# Patient Record
Sex: Male | Born: 1940 | Race: White | Hispanic: No | Marital: Married | Smoking: Former smoker
Health system: Southern US, Community
[De-identification: ages and names within clinical notes are randomized; demographics above are authoritative.]

## PROBLEM LIST (undated history)

## (undated) DIAGNOSIS — T387X5A Adverse effect of androgens and anabolic congeners, initial encounter: Secondary | ICD-10-CM

## (undated) DIAGNOSIS — R7989 Other specified abnormal findings of blood chemistry: Secondary | ICD-10-CM

## (undated) DIAGNOSIS — R011 Cardiac murmur, unspecified: Secondary | ICD-10-CM

## (undated) DIAGNOSIS — N289 Disorder of kidney and ureter, unspecified: Secondary | ICD-10-CM

## (undated) DIAGNOSIS — Z8489 Family history of other specified conditions: Secondary | ICD-10-CM

## (undated) DIAGNOSIS — E785 Hyperlipidemia, unspecified: Secondary | ICD-10-CM

## (undated) DIAGNOSIS — R351 Nocturia: Secondary | ICD-10-CM

## (undated) DIAGNOSIS — D649 Anemia, unspecified: Secondary | ICD-10-CM

## (undated) DIAGNOSIS — M818 Other osteoporosis without current pathological fracture: Secondary | ICD-10-CM

## (undated) DIAGNOSIS — N183 Chronic kidney disease, stage 3 unspecified: Secondary | ICD-10-CM

## (undated) DIAGNOSIS — J680 Bronchitis and pneumonitis due to chemicals, gases, fumes and vapors: Secondary | ICD-10-CM

## (undated) DIAGNOSIS — Z86718 Personal history of other venous thrombosis and embolism: Secondary | ICD-10-CM

## (undated) DIAGNOSIS — I340 Nonrheumatic mitral (valve) insufficiency: Secondary | ICD-10-CM

## (undated) DIAGNOSIS — R1031 Right lower quadrant pain: Secondary | ICD-10-CM

## (undated) DIAGNOSIS — I4510 Unspecified right bundle-branch block: Secondary | ICD-10-CM

## (undated) DIAGNOSIS — C8590 Non-Hodgkin lymphoma, unspecified, unspecified site: Secondary | ICD-10-CM

## (undated) DIAGNOSIS — Z9289 Personal history of other medical treatment: Secondary | ICD-10-CM

## (undated) DIAGNOSIS — I10 Essential (primary) hypertension: Secondary | ICD-10-CM

## (undated) DIAGNOSIS — I2510 Atherosclerotic heart disease of native coronary artery without angina pectoris: Secondary | ICD-10-CM

## (undated) DIAGNOSIS — H1031 Unspecified acute conjunctivitis, right eye: Secondary | ICD-10-CM

## (undated) DIAGNOSIS — R634 Abnormal weight loss: Secondary | ICD-10-CM

## (undated) HISTORY — PX: CARDIAC CATHETERIZATION: SHX172

## (undated) HISTORY — DX: Other specified abnormal findings of blood chemistry: R79.89

## (undated) HISTORY — DX: Personal history of other venous thrombosis and embolism: Z86.718

## (undated) HISTORY — DX: Chronic kidney disease, stage 3 unspecified: N18.30

## (undated) HISTORY — DX: Right lower quadrant pain: R10.31

## (undated) HISTORY — DX: Adverse effect of androgens and anabolic congeners, initial encounter: T38.7X5A

## (undated) HISTORY — DX: Nonrheumatic mitral (valve) insufficiency: I34.0

## (undated) HISTORY — DX: Unspecified right bundle-branch block: I45.10

## (undated) HISTORY — DX: Unspecified acute conjunctivitis, right eye: H10.31

## (undated) HISTORY — DX: Hyperlipidemia, unspecified: E78.5

## (undated) HISTORY — DX: Anemia, unspecified: D64.9

## (undated) HISTORY — DX: Abnormal weight loss: R63.4

## (undated) HISTORY — DX: Other osteoporosis without current pathological fracture: M81.8

## (undated) HISTORY — DX: Chronic kidney disease, stage 3 (moderate): N18.3

## (undated) HISTORY — DX: Nocturia: R35.1

---
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
## 1988-12-19 HISTORY — PX: CATARACT EXTRACTION W/ INTRAOCULAR LENS IMPLANT: SHX1309

## 1999-11-12 ENCOUNTER — Encounter: Admission: RE | Admit: 1999-11-12 | Discharge: 1999-11-12 | Payer: Self-pay | Admitting: Internal Medicine

## 1999-11-12 ENCOUNTER — Encounter: Payer: Self-pay | Admitting: Internal Medicine

## 1999-11-18 ENCOUNTER — Encounter: Payer: Self-pay | Admitting: Internal Medicine

## 1999-11-18 ENCOUNTER — Ambulatory Visit (HOSPITAL_COMMUNITY): Admission: RE | Admit: 1999-11-18 | Discharge: 1999-11-18 | Payer: Self-pay | Admitting: Internal Medicine

## 2001-06-24 ENCOUNTER — Encounter: Payer: Self-pay | Admitting: Internal Medicine

## 2001-06-24 ENCOUNTER — Encounter: Admission: RE | Admit: 2001-06-24 | Discharge: 2001-06-24 | Payer: Self-pay | Admitting: Internal Medicine

## 2001-06-28 ENCOUNTER — Encounter (INDEPENDENT_AMBULATORY_CARE_PROVIDER_SITE_OTHER): Payer: Self-pay

## 2001-06-28 ENCOUNTER — Inpatient Hospital Stay (HOSPITAL_COMMUNITY): Admission: RE | Admit: 2001-06-28 | Discharge: 2001-07-05 | Payer: Self-pay | Admitting: Internal Medicine

## 2001-06-28 ENCOUNTER — Encounter: Payer: Self-pay | Admitting: Internal Medicine

## 2001-06-28 ENCOUNTER — Encounter (INDEPENDENT_AMBULATORY_CARE_PROVIDER_SITE_OTHER): Payer: Self-pay | Admitting: Specialist

## 2001-07-15 ENCOUNTER — Ambulatory Visit (HOSPITAL_COMMUNITY): Admission: RE | Admit: 2001-07-15 | Discharge: 2001-07-15 | Payer: Self-pay | Admitting: Oncology

## 2001-07-15 ENCOUNTER — Encounter: Payer: Self-pay | Admitting: Oncology

## 2001-07-18 ENCOUNTER — Encounter: Payer: Self-pay | Admitting: *Deleted

## 2001-07-18 ENCOUNTER — Ambulatory Visit (HOSPITAL_BASED_OUTPATIENT_CLINIC_OR_DEPARTMENT_OTHER): Admission: RE | Admit: 2001-07-18 | Discharge: 2001-07-18 | Payer: Self-pay | Admitting: *Deleted

## 2001-08-01 ENCOUNTER — Encounter: Payer: Self-pay | Admitting: Oncology

## 2001-08-01 ENCOUNTER — Ambulatory Visit (HOSPITAL_COMMUNITY): Admission: RE | Admit: 2001-08-01 | Discharge: 2001-08-01 | Payer: Self-pay | Admitting: Oncology

## 2001-09-12 ENCOUNTER — Ambulatory Visit (HOSPITAL_COMMUNITY): Admission: RE | Admit: 2001-09-12 | Discharge: 2001-09-12 | Payer: Self-pay | Admitting: Oncology

## 2001-09-12 ENCOUNTER — Encounter: Payer: Self-pay | Admitting: Oncology

## 2001-09-13 ENCOUNTER — Ambulatory Visit (HOSPITAL_COMMUNITY): Admission: RE | Admit: 2001-09-13 | Discharge: 2001-09-13 | Payer: Self-pay | Admitting: Oncology

## 2001-09-13 ENCOUNTER — Encounter: Payer: Self-pay | Admitting: Oncology

## 2001-11-23 ENCOUNTER — Encounter: Payer: Self-pay | Admitting: Oncology

## 2001-11-23 ENCOUNTER — Ambulatory Visit (HOSPITAL_COMMUNITY): Admission: RE | Admit: 2001-11-23 | Discharge: 2001-11-23 | Payer: Self-pay | Admitting: Oncology

## 2001-12-01 ENCOUNTER — Ambulatory Visit (HOSPITAL_COMMUNITY): Admission: RE | Admit: 2001-12-01 | Discharge: 2001-12-01 | Payer: Self-pay | Admitting: Oncology

## 2001-12-01 ENCOUNTER — Encounter: Payer: Self-pay | Admitting: Oncology

## 2002-04-18 ENCOUNTER — Encounter: Payer: Self-pay | Admitting: Oncology

## 2002-04-18 ENCOUNTER — Ambulatory Visit (HOSPITAL_COMMUNITY): Admission: RE | Admit: 2002-04-18 | Discharge: 2002-04-18 | Payer: Self-pay | Admitting: Oncology

## 2002-05-10 ENCOUNTER — Ambulatory Visit (HOSPITAL_BASED_OUTPATIENT_CLINIC_OR_DEPARTMENT_OTHER): Admission: RE | Admit: 2002-05-10 | Discharge: 2002-05-10 | Payer: Self-pay | Admitting: *Deleted

## 2002-08-15 ENCOUNTER — Ambulatory Visit (HOSPITAL_COMMUNITY): Admission: RE | Admit: 2002-08-15 | Discharge: 2002-08-15 | Payer: Self-pay | Admitting: Oncology

## 2002-08-15 ENCOUNTER — Encounter: Payer: Self-pay | Admitting: Oncology

## 2002-12-14 ENCOUNTER — Encounter: Payer: Self-pay | Admitting: Oncology

## 2002-12-14 ENCOUNTER — Ambulatory Visit (HOSPITAL_COMMUNITY): Admission: RE | Admit: 2002-12-14 | Discharge: 2002-12-14 | Payer: Self-pay | Admitting: Oncology

## 2003-04-04 ENCOUNTER — Ambulatory Visit (HOSPITAL_COMMUNITY): Admission: RE | Admit: 2003-04-04 | Discharge: 2003-04-04 | Payer: Self-pay | Admitting: Oncology

## 2003-07-10 ENCOUNTER — Ambulatory Visit (HOSPITAL_COMMUNITY): Admission: RE | Admit: 2003-07-10 | Discharge: 2003-07-10 | Payer: Self-pay | Admitting: Oncology

## 2003-10-01 ENCOUNTER — Ambulatory Visit (HOSPITAL_COMMUNITY): Admission: RE | Admit: 2003-10-01 | Discharge: 2003-10-01 | Payer: Self-pay | Admitting: *Deleted

## 2003-11-29 ENCOUNTER — Ambulatory Visit (HOSPITAL_COMMUNITY): Admission: RE | Admit: 2003-11-29 | Discharge: 2003-11-29 | Payer: Self-pay | Admitting: Oncology

## 2004-03-20 ENCOUNTER — Ambulatory Visit: Payer: Self-pay | Admitting: Oncology

## 2004-05-09 ENCOUNTER — Ambulatory Visit: Payer: Self-pay | Admitting: Oncology

## 2004-05-12 ENCOUNTER — Ambulatory Visit (HOSPITAL_COMMUNITY): Admission: RE | Admit: 2004-05-12 | Discharge: 2004-05-12 | Payer: Self-pay | Admitting: Oncology

## 2004-11-04 ENCOUNTER — Ambulatory Visit: Payer: Self-pay | Admitting: Oncology

## 2004-11-10 ENCOUNTER — Ambulatory Visit (HOSPITAL_COMMUNITY): Admission: RE | Admit: 2004-11-10 | Discharge: 2004-11-10 | Payer: Self-pay | Admitting: Oncology

## 2005-05-05 ENCOUNTER — Ambulatory Visit: Payer: Self-pay | Admitting: Oncology

## 2005-05-12 ENCOUNTER — Ambulatory Visit (HOSPITAL_COMMUNITY): Admission: RE | Admit: 2005-05-12 | Discharge: 2005-05-12 | Payer: Self-pay | Admitting: Oncology

## 2006-03-04 ENCOUNTER — Encounter: Payer: Self-pay | Admitting: Vascular Surgery

## 2006-03-04 ENCOUNTER — Ambulatory Visit (HOSPITAL_COMMUNITY): Admission: RE | Admit: 2006-03-04 | Discharge: 2006-03-04 | Payer: Self-pay | Admitting: Cardiology

## 2006-03-29 ENCOUNTER — Inpatient Hospital Stay (HOSPITAL_COMMUNITY): Admission: RE | Admit: 2006-03-29 | Discharge: 2006-04-03 | Payer: Self-pay | Admitting: Surgery

## 2006-03-29 HISTORY — PX: CORONARY ARTERY BYPASS GRAFT: SHX141

## 2006-04-29 ENCOUNTER — Encounter (HOSPITAL_COMMUNITY): Admission: RE | Admit: 2006-04-29 | Discharge: 2006-07-28 | Payer: Self-pay | Admitting: Cardiology

## 2006-07-29 ENCOUNTER — Encounter (HOSPITAL_COMMUNITY): Admission: RE | Admit: 2006-07-29 | Discharge: 2006-07-30 | Payer: Self-pay | Admitting: Cardiology

## 2006-09-06 ENCOUNTER — Ambulatory Visit: Payer: Self-pay | Admitting: Oncology

## 2006-09-08 LAB — CBC WITH DIFFERENTIAL/PLATELET
Basophils Absolute: 0 10*3/uL (ref 0.0–0.1)
EOS%: 3.5 % (ref 0.0–7.0)
Eosinophils Absolute: 0.2 10*3/uL (ref 0.0–0.5)
HCT: 37.9 % — ABNORMAL LOW (ref 38.7–49.9)
HGB: 13.2 g/dL (ref 13.0–17.1)
MCH: 30.8 pg (ref 28.0–33.4)
MCV: 88.6 fL (ref 81.6–98.0)
MONO%: 12.7 % (ref 0.0–13.0)
NEUT#: 3.7 10*3/uL (ref 1.5–6.5)
NEUT%: 60 % (ref 40.0–75.0)
Platelets: 297 10*3/uL (ref 145–400)

## 2006-09-08 LAB — MORPHOLOGY: PLT EST: ADEQUATE

## 2006-09-09 LAB — COMPREHENSIVE METABOLIC PANEL
ALT: 16 U/L (ref 0–53)
AST: 21 U/L (ref 0–37)
Albumin: 4.7 g/dL (ref 3.5–5.2)
BUN: 12 mg/dL (ref 6–23)
CO2: 25 mEq/L (ref 19–32)
Calcium: 9.1 mg/dL (ref 8.4–10.5)
Chloride: 105 mEq/L (ref 96–112)
Creatinine, Ser: 1.08 mg/dL (ref 0.40–1.50)
Potassium: 4.3 mEq/L (ref 3.5–5.3)

## 2006-09-09 LAB — LACTATE DEHYDROGENASE: LDH: 137 U/L (ref 94–250)

## 2006-09-10 ENCOUNTER — Ambulatory Visit (HOSPITAL_COMMUNITY): Admission: RE | Admit: 2006-09-10 | Discharge: 2006-09-10 | Payer: Self-pay | Admitting: Oncology

## 2007-09-05 ENCOUNTER — Ambulatory Visit: Payer: Self-pay | Admitting: Oncology

## 2007-09-07 LAB — COMPREHENSIVE METABOLIC PANEL
BUN: 18 mg/dL (ref 6–23)
CO2: 21 mEq/L (ref 19–32)
Calcium: 9.3 mg/dL (ref 8.4–10.5)
Chloride: 103 mEq/L (ref 96–112)
Creatinine, Ser: 1.15 mg/dL (ref 0.40–1.50)
Glucose, Bld: 85 mg/dL (ref 70–99)

## 2007-09-07 LAB — CBC WITH DIFFERENTIAL/PLATELET
BASO%: 0.2 % (ref 0.0–2.0)
EOS%: 3.6 % (ref 0.0–7.0)
HCT: 40.1 % (ref 38.7–49.9)
LYMPH%: 18.8 % (ref 14.0–48.0)
MCH: 31.6 pg (ref 28.0–33.4)
MCHC: 34.3 g/dL (ref 32.0–35.9)
NEUT%: 67.2 % (ref 40.0–75.0)
Platelets: 303 10*3/uL (ref 145–400)
lymph#: 1.3 10*3/uL (ref 0.9–3.3)

## 2007-09-09 ENCOUNTER — Ambulatory Visit (HOSPITAL_COMMUNITY): Admission: RE | Admit: 2007-09-09 | Discharge: 2007-09-09 | Payer: Self-pay | Admitting: Oncology

## 2007-12-12 ENCOUNTER — Ambulatory Visit (HOSPITAL_COMMUNITY): Admission: RE | Admit: 2007-12-12 | Discharge: 2007-12-12 | Payer: Self-pay | Admitting: Internal Medicine

## 2008-01-10 ENCOUNTER — Ambulatory Visit: Payer: Self-pay | Admitting: Oncology

## 2008-01-10 LAB — CBC WITH DIFFERENTIAL/PLATELET
Basophils Absolute: 0 10*3/uL (ref 0.0–0.1)
Eosinophils Absolute: 0.1 10*3/uL (ref 0.0–0.5)
HGB: 11.1 g/dL — ABNORMAL LOW (ref 13.0–17.1)
LYMPH%: 9.1 % — ABNORMAL LOW (ref 14.0–48.0)
MCV: 86.5 fL (ref 81.6–98.0)
MONO#: 0.8 10*3/uL (ref 0.1–0.9)
MONO%: 8.3 % (ref 0.0–13.0)
NEUT#: 8.3 10*3/uL — ABNORMAL HIGH (ref 1.5–6.5)
Platelets: 479 10*3/uL — ABNORMAL HIGH (ref 145–400)
RDW: 12.1 % (ref 11.2–14.6)

## 2008-01-10 LAB — COMPREHENSIVE METABOLIC PANEL
Albumin: 3.6 g/dL (ref 3.5–5.2)
Alkaline Phosphatase: 72 U/L (ref 39–117)
BUN: 12 mg/dL (ref 6–23)
CO2: 24 mEq/L (ref 19–32)
Glucose, Bld: 99 mg/dL (ref 70–99)
Potassium: 4.3 mEq/L (ref 3.5–5.3)

## 2008-01-10 LAB — LACTATE DEHYDROGENASE: LDH: 120 U/L (ref 94–250)

## 2008-01-10 LAB — URINALYSIS, MICROSCOPIC - CHCC
Blood: NEGATIVE
Glucose: NEGATIVE g/dL
Leukocyte Esterase: NEGATIVE
Nitrite: NEGATIVE

## 2008-01-13 ENCOUNTER — Ambulatory Visit (HOSPITAL_COMMUNITY): Admission: RE | Admit: 2008-01-13 | Discharge: 2008-01-13 | Payer: Self-pay | Admitting: Oncology

## 2008-01-19 ENCOUNTER — Encounter (INDEPENDENT_AMBULATORY_CARE_PROVIDER_SITE_OTHER): Payer: Self-pay | Admitting: Diagnostic Radiology

## 2008-01-19 ENCOUNTER — Ambulatory Visit (HOSPITAL_COMMUNITY): Admission: RE | Admit: 2008-01-19 | Discharge: 2008-01-19 | Payer: Self-pay | Admitting: Oncology

## 2008-01-24 ENCOUNTER — Inpatient Hospital Stay (HOSPITAL_COMMUNITY): Admission: AD | Admit: 2008-01-24 | Discharge: 2008-01-27 | Payer: Self-pay | Admitting: Oncology

## 2008-01-25 ENCOUNTER — Ambulatory Visit: Payer: Self-pay | Admitting: Oncology

## 2008-01-27 ENCOUNTER — Ambulatory Visit: Payer: Self-pay | Admitting: Vascular Surgery

## 2008-01-27 ENCOUNTER — Encounter: Payer: Self-pay | Admitting: Oncology

## 2008-02-07 LAB — LACTATE DEHYDROGENASE: LDH: 310 U/L — ABNORMAL HIGH (ref 94–250)

## 2008-02-07 LAB — COMPREHENSIVE METABOLIC PANEL
Albumin: 4 g/dL (ref 3.5–5.2)
CO2: 24 mEq/L (ref 19–32)
Calcium: 8.8 mg/dL (ref 8.4–10.5)
Chloride: 105 mEq/L (ref 96–112)
Glucose, Bld: 96 mg/dL (ref 70–99)
Sodium: 138 mEq/L (ref 135–145)
Total Bilirubin: 0.2 mg/dL — ABNORMAL LOW (ref 0.3–1.2)
Total Protein: 6.7 g/dL (ref 6.0–8.3)

## 2008-02-07 LAB — CBC WITH DIFFERENTIAL/PLATELET
Eosinophils Absolute: 0.3 10*3/uL (ref 0.0–0.5)
LYMPH%: 5.7 % — ABNORMAL LOW (ref 14.0–48.0)
MCHC: 33.2 g/dL (ref 32.0–35.9)
MCV: 85.1 fL (ref 81.6–98.0)
MONO%: 2 % (ref 0.0–13.0)
NEUT#: 22.9 10*3/uL — ABNORMAL HIGH (ref 1.5–6.5)
NEUT%: 91.1 % — ABNORMAL HIGH (ref 40.0–75.0)
Platelets: 107 10*3/uL — ABNORMAL LOW (ref 145–400)
RBC: 3.63 10*6/uL — ABNORMAL LOW (ref 4.20–5.71)

## 2008-02-07 LAB — URIC ACID: Uric Acid, Serum: 4.2 mg/dL (ref 4.0–7.8)

## 2008-02-07 LAB — MORPHOLOGY

## 2008-02-14 LAB — CBC WITH DIFFERENTIAL/PLATELET
Basophils Absolute: 0 10*3/uL (ref 0.0–0.1)
EOS%: 0.4 % (ref 0.0–7.0)
HCT: 30.3 % — ABNORMAL LOW (ref 38.7–49.9)
HGB: 10.1 g/dL — ABNORMAL LOW (ref 13.0–17.1)
LYMPH%: 7.5 % — ABNORMAL LOW (ref 14.0–48.0)
MCH: 28.5 pg (ref 28.0–33.4)
MCV: 85.7 fL (ref 81.6–98.0)
MONO%: 5.1 % (ref 0.0–13.0)
NEUT%: 87 % — ABNORMAL HIGH (ref 40.0–75.0)

## 2008-02-14 LAB — COMPREHENSIVE METABOLIC PANEL
AST: 26 U/L (ref 0–37)
Alkaline Phosphatase: 106 U/L (ref 39–117)
BUN: 12 mg/dL (ref 6–23)
Creatinine, Ser: 1.02 mg/dL (ref 0.40–1.50)

## 2008-02-27 ENCOUNTER — Ambulatory Visit: Payer: Self-pay | Admitting: Oncology

## 2008-02-29 LAB — COMPREHENSIVE METABOLIC PANEL
AST: 27 U/L (ref 0–37)
Albumin: 4.5 g/dL (ref 3.5–5.2)
Alkaline Phosphatase: 146 U/L — ABNORMAL HIGH (ref 39–117)
Potassium: 4.4 mEq/L (ref 3.5–5.3)
Sodium: 140 mEq/L (ref 135–145)
Total Bilirubin: 0.3 mg/dL (ref 0.3–1.2)
Total Protein: 7.3 g/dL (ref 6.0–8.3)

## 2008-02-29 LAB — CBC WITH DIFFERENTIAL/PLATELET
EOS%: 0.7 % (ref 0.0–7.0)
MCH: 29 pg (ref 28.0–33.4)
MCHC: 33.5 g/dL (ref 32.0–35.9)
MCV: 86.5 fL (ref 81.6–98.0)
MONO%: 1.7 % (ref 0.0–13.0)
NEUT#: 21.6 10*3/uL — ABNORMAL HIGH (ref 1.5–6.5)
RBC: 3.3 10*6/uL — ABNORMAL LOW (ref 4.20–5.71)
RDW: 17.5 % — ABNORMAL HIGH (ref 11.2–14.6)

## 2008-03-07 LAB — BASIC METABOLIC PANEL
BUN: 15 mg/dL (ref 6–23)
CO2: 25 mEq/L (ref 19–32)
Calcium: 8.8 mg/dL (ref 8.4–10.5)
Glucose, Bld: 95 mg/dL (ref 70–99)
Potassium: 4.7 mEq/L (ref 3.5–5.3)

## 2008-03-07 LAB — CBC WITH DIFFERENTIAL/PLATELET
BASO%: 0.1 % (ref 0.0–2.0)
EOS%: 0.7 % (ref 0.0–7.0)
LYMPH%: 8.5 % — ABNORMAL LOW (ref 14.0–48.0)
MCH: 29.6 pg (ref 28.0–33.4)
MCHC: 33.9 g/dL (ref 32.0–35.9)
MONO#: 0.9 10*3/uL (ref 0.1–0.9)
Platelets: 327 10*3/uL (ref 145–400)
RBC: 3.12 10*6/uL — ABNORMAL LOW (ref 4.20–5.71)
WBC: 11.4 10*3/uL — ABNORMAL HIGH (ref 4.0–10.0)

## 2008-03-20 ENCOUNTER — Encounter (HOSPITAL_COMMUNITY): Admission: RE | Admit: 2008-03-20 | Discharge: 2008-04-19 | Payer: Self-pay | Admitting: Oncology

## 2008-03-20 LAB — CBC WITH DIFFERENTIAL/PLATELET
Basophils Absolute: 0 10*3/uL (ref 0.0–0.1)
EOS%: 0.9 % (ref 0.0–7.0)
Eosinophils Absolute: 0.1 10*3/uL (ref 0.0–0.5)
HCT: 22.3 % — ABNORMAL LOW (ref 38.7–49.9)
HGB: 7.7 g/dL — ABNORMAL LOW (ref 13.0–17.1)
MCH: 30.9 pg (ref 28.0–33.4)
MCV: 90 fL (ref 81.6–98.0)
MONO%: 5 % (ref 0.0–13.0)
NEUT#: 10.3 10*3/uL — ABNORMAL HIGH (ref 1.5–6.5)
NEUT%: 85.2 % — ABNORMAL HIGH (ref 40.0–75.0)
Platelets: 47 10*3/uL — ABNORMAL LOW (ref 145–400)

## 2008-03-20 LAB — COMPREHENSIVE METABOLIC PANEL
AST: 19 U/L (ref 0–37)
Albumin: 4.2 g/dL (ref 3.5–5.2)
Alkaline Phosphatase: 119 U/L — ABNORMAL HIGH (ref 39–117)
BUN: 10 mg/dL (ref 6–23)
Calcium: 8.9 mg/dL (ref 8.4–10.5)
Creatinine, Ser: 1.04 mg/dL (ref 0.40–1.50)
Glucose, Bld: 106 mg/dL — ABNORMAL HIGH (ref 70–99)

## 2008-03-28 ENCOUNTER — Ambulatory Visit (HOSPITAL_COMMUNITY): Admission: RE | Admit: 2008-03-28 | Discharge: 2008-03-28 | Payer: Self-pay | Admitting: Oncology

## 2008-04-06 ENCOUNTER — Ambulatory Visit: Payer: Self-pay | Admitting: Oncology

## 2008-04-10 LAB — BASIC METABOLIC PANEL
BUN: 23 mg/dL (ref 6–23)
CO2: 19 mEq/L (ref 19–32)
Calcium: 8.1 mg/dL — ABNORMAL LOW (ref 8.4–10.5)
Creatinine, Ser: 1.24 mg/dL (ref 0.40–1.50)
Glucose, Bld: 98 mg/dL (ref 70–99)

## 2008-04-10 LAB — CBC WITH DIFFERENTIAL/PLATELET
BASO%: 0 % (ref 0.0–2.0)
Basophils Absolute: 0 10*3/uL (ref 0.0–0.1)
Eosinophils Absolute: 0.1 10*3/uL (ref 0.0–0.5)
HCT: 28.3 % — ABNORMAL LOW (ref 38.7–49.9)
HGB: 9.6 g/dL — ABNORMAL LOW (ref 13.0–17.1)
LYMPH%: 2.8 % — ABNORMAL LOW (ref 14.0–48.0)
MCHC: 34.1 g/dL (ref 32.0–35.9)
MONO#: 0.1 10*3/uL (ref 0.1–0.9)
NEUT%: 96 % — ABNORMAL HIGH (ref 40.0–75.0)
Platelets: 144 10*3/uL — ABNORMAL LOW (ref 145–400)
WBC: 14.5 10*3/uL — ABNORMAL HIGH (ref 4.0–10.0)

## 2008-04-12 LAB — CBC WITH DIFFERENTIAL/PLATELET
Basophils Absolute: 0 10*3/uL (ref 0.0–0.1)
EOS%: 1.3 % (ref 0.0–7.0)
HGB: 9.3 g/dL — ABNORMAL LOW (ref 13.0–17.1)
LYMPH%: 9.5 % — ABNORMAL LOW (ref 14.0–48.0)
MCH: 33.6 pg — ABNORMAL HIGH (ref 28.0–33.4)
MCV: 97 fL (ref 81.6–98.0)
MONO%: 1.7 % (ref 0.0–13.0)
NEUT%: 87.4 % — ABNORMAL HIGH (ref 40.0–75.0)
RDW: 22.7 % — ABNORMAL HIGH (ref 11.2–14.6)

## 2008-04-20 HISTORY — PX: BONE MARROW TRANSPLANT: SHX200

## 2008-04-23 LAB — CBC WITH DIFFERENTIAL/PLATELET
Basophils Absolute: 0 10*3/uL (ref 0.0–0.1)
Eosinophils Absolute: 0.2 10*3/uL (ref 0.0–0.5)
HCT: 29.4 % — ABNORMAL LOW (ref 38.7–49.9)
HGB: 10.2 g/dL — ABNORMAL LOW (ref 13.0–17.1)
MCH: 32.7 pg (ref 28.0–33.4)
MONO#: 1.1 10*3/uL — ABNORMAL HIGH (ref 0.1–0.9)
NEUT#: 6.1 10*3/uL (ref 1.5–6.5)
NEUT%: 76.8 % — ABNORMAL HIGH (ref 40.0–75.0)
WBC: 7.9 10*3/uL (ref 4.0–10.0)
lymph#: 0.6 10*3/uL — ABNORMAL LOW (ref 0.9–3.3)

## 2008-05-23 ENCOUNTER — Ambulatory Visit: Payer: Self-pay | Admitting: Oncology

## 2008-05-25 LAB — CBC WITH DIFFERENTIAL/PLATELET
Basophils Absolute: 0 10*3/uL (ref 0.0–0.1)
Eosinophils Absolute: 0 10*3/uL (ref 0.0–0.5)
HCT: 28.5 % — ABNORMAL LOW (ref 38.7–49.9)
HGB: 9.9 g/dL — ABNORMAL LOW (ref 13.0–17.1)
LYMPH%: 11.4 % — ABNORMAL LOW (ref 14.0–48.0)
MCV: 92.7 fL (ref 81.6–98.0)
MONO%: 22.4 % — ABNORMAL HIGH (ref 0.0–13.0)
NEUT#: 5.8 10*3/uL (ref 1.5–6.5)
NEUT%: 65.8 % (ref 40.0–75.0)
Platelets: 49 10*3/uL — ABNORMAL LOW (ref 145–400)
RBC: 3.08 10*6/uL — ABNORMAL LOW (ref 4.20–5.71)

## 2008-05-29 LAB — CBC WITH DIFFERENTIAL/PLATELET
Basophils Absolute: 0.1 10*3/uL (ref 0.0–0.1)
Eosinophils Absolute: 0 10*3/uL (ref 0.0–0.5)
HCT: 31.8 % — ABNORMAL LOW (ref 38.7–49.9)
HGB: 10.8 g/dL — ABNORMAL LOW (ref 13.0–17.1)
LYMPH%: 23 % (ref 14.0–48.0)
MCHC: 33.9 g/dL (ref 32.0–35.9)
MONO#: 1.7 10*3/uL — ABNORMAL HIGH (ref 0.1–0.9)
NEUT#: 3.5 10*3/uL (ref 1.5–6.5)
NEUT%: 51.1 % (ref 40.0–75.0)
Platelets: 99 10*3/uL — ABNORMAL LOW (ref 145–400)
WBC: 6.9 10*3/uL (ref 4.0–10.0)
lymph#: 1.6 10*3/uL (ref 0.9–3.3)

## 2008-05-29 LAB — COMPREHENSIVE METABOLIC PANEL
ALT: 12 U/L (ref 0–53)
BUN: 14 mg/dL (ref 6–23)
CO2: 23 mEq/L (ref 19–32)
Creatinine, Ser: 1.07 mg/dL (ref 0.40–1.50)
Total Bilirubin: 0.8 mg/dL (ref 0.3–1.2)

## 2008-05-29 LAB — MAGNESIUM: Magnesium: 1.7 mg/dL (ref 1.5–2.5)

## 2008-06-05 ENCOUNTER — Ambulatory Visit: Payer: Self-pay | Admitting: Oncology

## 2008-06-05 ENCOUNTER — Inpatient Hospital Stay (HOSPITAL_COMMUNITY): Admission: AD | Admit: 2008-06-05 | Discharge: 2008-06-07 | Payer: Self-pay | Admitting: Oncology

## 2008-06-05 LAB — CBC WITH DIFFERENTIAL/PLATELET
BASO%: 3.1 % — ABNORMAL HIGH (ref 0.0–2.0)
Basophils Absolute: 0.3 10*3/uL — ABNORMAL HIGH (ref 0.0–0.1)
EOS%: 1.9 % (ref 0.0–7.0)
HCT: 33.7 % — ABNORMAL LOW (ref 38.7–49.9)
HGB: 11.6 g/dL — ABNORMAL LOW (ref 13.0–17.1)
LYMPH%: 40.7 % (ref 14.0–48.0)
MCH: 32.4 pg (ref 28.0–33.4)
MCHC: 34.4 g/dL (ref 32.0–35.9)
MCV: 94.2 fL (ref 81.6–98.0)
NEUT%: 37.6 % — ABNORMAL LOW (ref 40.0–75.0)
Platelets: 156 10*3/uL (ref 145–400)

## 2008-06-05 LAB — COMPREHENSIVE METABOLIC PANEL
AST: 39 U/L — ABNORMAL HIGH (ref 0–37)
Albumin: 3.1 g/dL — ABNORMAL LOW (ref 3.5–5.2)
Alkaline Phosphatase: 101 U/L (ref 39–117)
BUN: 8 mg/dL (ref 6–23)
Potassium: 4.2 mEq/L (ref 3.5–5.3)
Total Bilirubin: 0.7 mg/dL (ref 0.3–1.2)

## 2008-06-05 LAB — MORPHOLOGY

## 2008-07-02 LAB — CBC WITH DIFFERENTIAL/PLATELET
Eosinophils Absolute: 0 10*3/uL (ref 0.0–0.5)
MONO#: 0.3 10*3/uL (ref 0.1–0.9)
NEUT#: 6.4 10*3/uL (ref 1.5–6.5)
RBC: 3.5 10*6/uL — ABNORMAL LOW (ref 4.20–5.82)
RDW: 21.4 % — ABNORMAL HIGH (ref 11.0–14.6)
WBC: 7.6 10*3/uL (ref 4.0–10.3)
lymph#: 0.8 10*3/uL — ABNORMAL LOW (ref 0.9–3.3)

## 2008-07-02 LAB — COMPREHENSIVE METABOLIC PANEL
AST: 32 U/L (ref 0–37)
Albumin: 4 g/dL (ref 3.5–5.2)
Alkaline Phosphatase: 88 U/L (ref 39–117)
BUN: 11 mg/dL (ref 6–23)
Potassium: 4.4 mEq/L (ref 3.5–5.3)
Sodium: 134 mEq/L — ABNORMAL LOW (ref 135–145)
Total Bilirubin: 0.7 mg/dL (ref 0.3–1.2)

## 2008-07-03 ENCOUNTER — Ambulatory Visit (HOSPITAL_COMMUNITY): Admission: RE | Admit: 2008-07-03 | Discharge: 2008-07-03 | Payer: Self-pay | Admitting: Oncology

## 2008-07-19 ENCOUNTER — Ambulatory Visit: Payer: Self-pay | Admitting: Oncology

## 2008-07-24 LAB — CBC WITH DIFFERENTIAL/PLATELET
BASO%: 0.3 % (ref 0.0–2.0)
EOS%: 0.4 % (ref 0.0–7.0)
HCT: 29.7 % — ABNORMAL LOW (ref 38.4–49.9)
HGB: 10.4 g/dL — ABNORMAL LOW (ref 13.0–17.1)
MCH: 35.7 pg — ABNORMAL HIGH (ref 27.2–33.4)
MCHC: 34.8 g/dL (ref 32.0–36.0)
MONO#: 0.8 10*3/uL (ref 0.1–0.9)
NEUT%: 64.6 % (ref 39.0–75.0)
RDW: 16.8 % — ABNORMAL HIGH (ref 11.0–14.6)
WBC: 5.8 10*3/uL (ref 4.0–10.3)
lymph#: 1.3 10*3/uL (ref 0.9–3.3)

## 2008-07-24 LAB — COMPREHENSIVE METABOLIC PANEL
Alkaline Phosphatase: 113 U/L (ref 39–117)
BUN: 13 mg/dL (ref 6–23)
CO2: 23 mEq/L (ref 19–32)
Creatinine, Ser: 1.03 mg/dL (ref 0.40–1.50)
Glucose, Bld: 91 mg/dL (ref 70–99)
Sodium: 137 mEq/L (ref 135–145)
Total Bilirubin: 0.7 mg/dL (ref 0.3–1.2)

## 2008-07-24 LAB — MORPHOLOGY

## 2008-07-24 LAB — LACTATE DEHYDROGENASE: LDH: 145 U/L (ref 94–250)

## 2008-08-03 LAB — CBC WITH DIFFERENTIAL/PLATELET
BASO%: 0.2 % (ref 0.0–2.0)
Basophils Absolute: 0 10*3/uL (ref 0.0–0.1)
Eosinophils Absolute: 0.1 10*3/uL (ref 0.0–0.5)
HCT: 27.7 % — ABNORMAL LOW (ref 38.4–49.9)
HGB: 9.5 g/dL — ABNORMAL LOW (ref 13.0–17.1)
LYMPH%: 20 % (ref 14.0–49.0)
MCHC: 34.3 g/dL (ref 32.0–36.0)
MONO#: 1.4 10*3/uL — ABNORMAL HIGH (ref 0.1–0.9)
NEUT#: 5.8 10*3/uL (ref 1.5–6.5)
NEUT%: 63.6 % (ref 39.0–75.0)
Platelets: 123 10*3/uL — ABNORMAL LOW (ref 140–400)
WBC: 9.2 10*3/uL (ref 4.0–10.3)
lymph#: 1.8 10*3/uL (ref 0.9–3.3)

## 2008-08-06 LAB — COMPREHENSIVE METABOLIC PANEL
Albumin: 3.4 g/dL — ABNORMAL LOW (ref 3.5–5.2)
BUN: 12 mg/dL (ref 6–23)
CO2: 24 mEq/L (ref 19–32)
Glucose, Bld: 100 mg/dL — ABNORMAL HIGH (ref 70–99)
Potassium: 3.9 mEq/L (ref 3.5–5.3)
Sodium: 137 mEq/L (ref 135–145)
Total Protein: 6.3 g/dL (ref 6.0–8.3)

## 2008-08-06 LAB — CBC WITH DIFFERENTIAL/PLATELET
Basophils Absolute: 0 10*3/uL (ref 0.0–0.1)
Eosinophils Absolute: 0.1 10*3/uL (ref 0.0–0.5)
HGB: 10.4 g/dL — ABNORMAL LOW (ref 13.0–17.1)
LYMPH%: 32.3 % (ref 14.0–49.0)
MONO#: 1.7 10*3/uL — ABNORMAL HIGH (ref 0.1–0.9)
NEUT#: 5.1 10*3/uL (ref 1.5–6.5)
Platelets: 178 10*3/uL (ref 140–400)
RBC: 2.98 10*6/uL — ABNORMAL LOW (ref 4.20–5.82)
WBC: 10.1 10*3/uL (ref 4.0–10.3)

## 2008-08-07 ENCOUNTER — Ambulatory Visit (HOSPITAL_COMMUNITY): Admission: RE | Admit: 2008-08-07 | Discharge: 2008-08-07 | Payer: Self-pay | Admitting: Oncology

## 2008-08-10 ENCOUNTER — Ambulatory Visit: Admission: RE | Admit: 2008-08-10 | Discharge: 2008-08-10 | Payer: Self-pay | Admitting: Oncology

## 2008-09-04 ENCOUNTER — Ambulatory Visit: Payer: Self-pay | Admitting: Oncology

## 2008-09-04 LAB — CBC WITH DIFFERENTIAL/PLATELET
BASO%: 0 % (ref 0.0–2.0)
EOS%: 0.2 % (ref 0.0–7.0)
HCT: 32.2 % — ABNORMAL LOW (ref 38.4–49.9)
MCH: 38.1 pg — ABNORMAL HIGH (ref 27.2–33.4)
MCHC: 34.1 g/dL (ref 32.0–36.0)
MONO%: 3.3 % (ref 0.0–14.0)
NEUT#: 10.3 10*3/uL — ABNORMAL HIGH (ref 1.5–6.5)
Platelets: 187 10*3/uL (ref 140–400)
RBC: 2.88 10*6/uL — ABNORMAL LOW (ref 4.20–5.82)
RDW: 21.3 % — ABNORMAL HIGH (ref 11.0–14.6)
WBC: 11.1 10*3/uL — ABNORMAL HIGH (ref 4.0–10.3)
lymph#: 0.5 10*3/uL — ABNORMAL LOW (ref 0.9–3.3)

## 2008-09-04 LAB — COMPREHENSIVE METABOLIC PANEL
Albumin: 3.9 g/dL (ref 3.5–5.2)
Alkaline Phosphatase: 74 U/L (ref 39–117)
BUN: 17 mg/dL (ref 6–23)
CO2: 28 mEq/L (ref 19–32)
Glucose, Bld: 105 mg/dL — ABNORMAL HIGH (ref 70–99)
Total Bilirubin: 1 mg/dL (ref 0.3–1.2)
Total Protein: 6 g/dL (ref 6.0–8.3)

## 2008-09-04 LAB — SEDIMENTATION RATE: Sed Rate: 2 mm/hr (ref 0–16)

## 2008-09-04 LAB — MORPHOLOGY

## 2008-09-04 LAB — LACTATE DEHYDROGENASE: LDH: 176 U/L (ref 94–250)

## 2008-10-30 ENCOUNTER — Ambulatory Visit (HOSPITAL_COMMUNITY): Admission: RE | Admit: 2008-10-30 | Discharge: 2008-10-30 | Payer: Self-pay | Admitting: Oncology

## 2008-10-31 ENCOUNTER — Ambulatory Visit: Payer: Self-pay | Admitting: Oncology

## 2008-11-02 LAB — CBC WITH DIFFERENTIAL/PLATELET
BASO%: 0.3 % (ref 0.0–2.0)
Basophils Absolute: 0 10*3/uL (ref 0.0–0.1)
EOS%: 0.6 % (ref 0.0–7.0)
HCT: 30.1 % — ABNORMAL LOW (ref 38.4–49.9)
HGB: 10.2 g/dL — ABNORMAL LOW (ref 13.0–17.1)
MCH: 39.5 pg — ABNORMAL HIGH (ref 27.2–33.4)
MONO#: 0.8 10*3/uL (ref 0.1–0.9)
RDW: 14 % (ref 11.0–14.6)
WBC: 9.5 10*3/uL (ref 4.0–10.3)
lymph#: 1.8 10*3/uL (ref 0.9–3.3)

## 2008-11-02 LAB — MORPHOLOGY: PLT EST: ADEQUATE

## 2008-11-02 LAB — COMPREHENSIVE METABOLIC PANEL
ALT: 25 U/L (ref 0–53)
AST: 31 U/L (ref 0–37)
Chloride: 105 mEq/L (ref 96–112)
Creatinine, Ser: 1.44 mg/dL (ref 0.40–1.50)
Sodium: 139 mEq/L (ref 135–145)
Total Bilirubin: 0.8 mg/dL (ref 0.3–1.2)
Total Protein: 5.9 g/dL — ABNORMAL LOW (ref 6.0–8.3)

## 2008-12-28 ENCOUNTER — Ambulatory Visit: Payer: Self-pay | Admitting: Oncology

## 2009-01-01 LAB — CBC WITH DIFFERENTIAL/PLATELET
BASO%: 0.3 % (ref 0.0–2.0)
EOS%: 2.3 % (ref 0.0–7.0)
HCT: 34 % — ABNORMAL LOW (ref 38.4–49.9)
LYMPH%: 21.9 % (ref 14.0–49.0)
MCH: 36.4 pg — ABNORMAL HIGH (ref 27.2–33.4)
MCHC: 35 g/dL (ref 32.0–36.0)
MONO#: 0.8 10*3/uL (ref 0.1–0.9)
MONO%: 12.4 % (ref 0.0–14.0)
NEUT%: 63.1 % (ref 39.0–75.0)
Platelets: 196 10*3/uL (ref 140–400)
RBC: 3.27 10*6/uL — ABNORMAL LOW (ref 4.20–5.82)
WBC: 6.3 10*3/uL (ref 4.0–10.3)

## 2009-01-01 LAB — COMPREHENSIVE METABOLIC PANEL
ALT: 17 U/L (ref 0–53)
BUN: 19 mg/dL (ref 6–23)
CO2: 25 mEq/L (ref 19–32)
Calcium: 9.6 mg/dL (ref 8.4–10.5)
Chloride: 104 mEq/L (ref 96–112)
Creatinine, Ser: 1.35 mg/dL (ref 0.40–1.50)
Glucose, Bld: 94 mg/dL (ref 70–99)
Total Bilirubin: 0.6 mg/dL (ref 0.3–1.2)

## 2009-01-01 LAB — MORPHOLOGY: PLT EST: ADEQUATE

## 2009-01-01 LAB — LACTATE DEHYDROGENASE: LDH: 152 U/L (ref 94–250)

## 2009-03-05 ENCOUNTER — Ambulatory Visit (HOSPITAL_COMMUNITY): Admission: RE | Admit: 2009-03-05 | Discharge: 2009-03-05 | Payer: Self-pay | Admitting: Oncology

## 2009-05-21 ENCOUNTER — Ambulatory Visit (HOSPITAL_COMMUNITY): Admission: RE | Admit: 2009-05-21 | Discharge: 2009-05-21 | Payer: Self-pay | Admitting: Oncology

## 2009-06-21 ENCOUNTER — Ambulatory Visit: Payer: Self-pay | Admitting: Oncology

## 2009-06-25 LAB — CBC WITH DIFFERENTIAL/PLATELET
BASO%: 0.2 % (ref 0.0–2.0)
EOS%: 3.5 % (ref 0.0–7.0)
HCT: 34.6 % — ABNORMAL LOW (ref 38.4–49.9)
LYMPH%: 21.6 % (ref 14.0–49.0)
MCH: 36.2 pg — ABNORMAL HIGH (ref 27.2–33.4)
MCHC: 35.3 g/dL (ref 32.0–36.0)
NEUT%: 61.5 % (ref 39.0–75.0)
Platelets: 199 10*3/uL (ref 140–400)
RBC: 3.37 10*6/uL — ABNORMAL LOW (ref 4.20–5.82)
lymph#: 1.5 10*3/uL (ref 0.9–3.3)

## 2009-06-25 LAB — MORPHOLOGY

## 2009-06-25 LAB — COMPREHENSIVE METABOLIC PANEL
CO2: 27 mEq/L (ref 19–32)
Creatinine, Ser: 1.36 mg/dL (ref 0.40–1.50)
Glucose, Bld: 80 mg/dL (ref 70–99)
Total Bilirubin: 0.6 mg/dL (ref 0.3–1.2)

## 2009-06-25 LAB — LACTATE DEHYDROGENASE: LDH: 125 U/L (ref 94–250)

## 2009-06-25 LAB — SEDIMENTATION RATE: Sed Rate: 13 mm/hr (ref 0–16)

## 2009-09-20 ENCOUNTER — Ambulatory Visit: Payer: Self-pay | Admitting: Oncology

## 2009-09-23 LAB — COMPREHENSIVE METABOLIC PANEL
Albumin: 3.9 g/dL (ref 3.5–5.2)
Alkaline Phosphatase: 84 U/L (ref 39–117)
BUN: 16 mg/dL (ref 6–23)
Calcium: 9.3 mg/dL (ref 8.4–10.5)
Chloride: 106 mEq/L (ref 96–112)
Glucose, Bld: 90 mg/dL (ref 70–99)
Potassium: 4.2 mEq/L (ref 3.5–5.3)
Sodium: 138 mEq/L (ref 135–145)
Total Protein: 6.4 g/dL (ref 6.0–8.3)

## 2009-09-23 LAB — CBC WITH DIFFERENTIAL/PLATELET
Basophils Absolute: 0 10*3/uL (ref 0.0–0.1)
Eosinophils Absolute: 0.2 10*3/uL (ref 0.0–0.5)
HGB: 11.5 g/dL — ABNORMAL LOW (ref 13.0–17.1)
MCV: 99.8 fL — ABNORMAL HIGH (ref 79.3–98.0)
MONO#: 0.9 10*3/uL (ref 0.1–0.9)
MONO%: 14 % (ref 0.0–14.0)
NEUT#: 3.7 10*3/uL (ref 1.5–6.5)
RBC: 3.34 10*6/uL — ABNORMAL LOW (ref 4.20–5.82)
RDW: 13 % (ref 11.0–14.6)
WBC: 6.3 10*3/uL (ref 4.0–10.3)
lymph#: 1.5 10*3/uL (ref 0.9–3.3)

## 2009-09-23 LAB — SEDIMENTATION RATE: Sed Rate: 15 mm/hr (ref 0–16)

## 2009-09-24 ENCOUNTER — Ambulatory Visit (HOSPITAL_COMMUNITY): Admission: RE | Admit: 2009-09-24 | Discharge: 2009-09-24 | Payer: Self-pay | Admitting: Oncology

## 2009-12-27 ENCOUNTER — Ambulatory Visit: Payer: Self-pay | Admitting: Oncology

## 2009-12-31 LAB — CBC WITH DIFFERENTIAL/PLATELET
BASO%: 0.3 % (ref 0.0–2.0)
Basophils Absolute: 0 10*3/uL (ref 0.0–0.1)
HCT: 33.5 % — ABNORMAL LOW (ref 38.4–49.9)
HGB: 11.7 g/dL — ABNORMAL LOW (ref 13.0–17.1)
LYMPH%: 24.9 % (ref 14.0–49.0)
MCH: 34.5 pg — ABNORMAL HIGH (ref 27.2–33.4)
MCHC: 34.8 g/dL (ref 32.0–36.0)
MONO#: 0.5 10*3/uL (ref 0.1–0.9)
NEUT%: 63.1 % (ref 39.0–75.0)
Platelets: 200 10*3/uL (ref 140–400)
WBC: 5.2 10*3/uL (ref 4.0–10.3)
lymph#: 1.3 10*3/uL (ref 0.9–3.3)

## 2010-01-01 LAB — COMPREHENSIVE METABOLIC PANEL
ALT: 26 U/L (ref 0–53)
BUN: 19 mg/dL (ref 6–23)
CO2: 27 mEq/L (ref 19–32)
Calcium: 9.9 mg/dL (ref 8.4–10.5)
Chloride: 103 mEq/L (ref 96–112)
Creatinine, Ser: 1.43 mg/dL (ref 0.40–1.50)
Total Bilirubin: 0.6 mg/dL (ref 0.3–1.2)

## 2010-01-01 LAB — SEDIMENTATION RATE: Sed Rate: 8 mm/hr (ref 0–16)

## 2010-01-01 LAB — LACTATE DEHYDROGENASE: LDH: 149 U/L (ref 94–250)

## 2010-04-28 ENCOUNTER — Ambulatory Visit: Payer: Self-pay | Admitting: Oncology

## 2010-04-28 LAB — COMPREHENSIVE METABOLIC PANEL
ALT: 39 U/L (ref 0–53)
AST: 33 U/L (ref 0–37)
Albumin: 3.9 g/dL (ref 3.5–5.2)
Alkaline Phosphatase: 83 U/L (ref 39–117)
BUN: 14 mg/dL (ref 6–23)
CO2: 28 mEq/L (ref 19–32)
Calcium: 9.6 mg/dL (ref 8.4–10.5)
Chloride: 104 mEq/L (ref 96–112)
Creatinine, Ser: 1.44 mg/dL (ref 0.40–1.50)
Glucose, Bld: 102 mg/dL — ABNORMAL HIGH (ref 70–99)
Potassium: 4.5 mEq/L (ref 3.5–5.3)
Sodium: 142 mEq/L (ref 135–145)
Total Bilirubin: 0.5 mg/dL (ref 0.3–1.2)
Total Protein: 6.4 g/dL (ref 6.0–8.3)

## 2010-04-28 LAB — CBC WITH DIFFERENTIAL/PLATELET
BASO%: 0.1 % (ref 0.0–2.0)
Basophils Absolute: 0 10*3/uL (ref 0.0–0.1)
EOS%: 6.9 % (ref 0.0–7.0)
Eosinophils Absolute: 0.4 10*3/uL (ref 0.0–0.5)
HCT: 32.4 % — ABNORMAL LOW (ref 38.4–49.9)
HGB: 11.1 g/dL — ABNORMAL LOW (ref 13.0–17.1)
LYMPH%: 23 % (ref 14.0–49.0)
MCH: 33.8 pg — ABNORMAL HIGH (ref 27.2–33.4)
MCHC: 34.2 g/dL (ref 32.0–36.0)
MCV: 98.8 fL — ABNORMAL HIGH (ref 79.3–98.0)
MONO#: 0.8 10*3/uL (ref 0.1–0.9)
MONO%: 14.3 % — ABNORMAL HIGH (ref 0.0–14.0)
NEUT#: 3.2 10*3/uL (ref 1.5–6.5)
NEUT%: 55.7 % (ref 39.0–75.0)
Platelets: 216 10*3/uL (ref 140–400)
RBC: 3.28 10*6/uL — ABNORMAL LOW (ref 4.20–5.82)
RDW: 13 % (ref 11.0–14.6)
WBC: 5.7 10*3/uL (ref 4.0–10.3)
lymph#: 1.3 10*3/uL (ref 0.9–3.3)

## 2010-04-28 LAB — SEDIMENTATION RATE: Sed Rate: 11 mm/hr (ref 0–16)

## 2010-04-28 LAB — URIC ACID: Uric Acid, Serum: 4.9 mg/dL (ref 4.0–7.8)

## 2010-04-28 LAB — MORPHOLOGY
PLT EST: ADEQUATE
RBC Comments: NORMAL

## 2010-04-28 LAB — LACTATE DEHYDROGENASE: LDH: 138 U/L (ref 94–250)

## 2010-04-29 ENCOUNTER — Ambulatory Visit (HOSPITAL_COMMUNITY)
Admission: RE | Admit: 2010-04-29 | Discharge: 2010-04-29 | Payer: Self-pay | Source: Home / Self Care | Attending: Oncology | Admitting: Oncology

## 2010-05-05 LAB — GLUCOSE, CAPILLARY: Glucose-Capillary: 98 mg/dL (ref 70–99)

## 2010-05-11 ENCOUNTER — Encounter: Payer: Self-pay | Admitting: Oncology

## 2010-05-11 ENCOUNTER — Encounter: Payer: Self-pay | Admitting: Internal Medicine

## 2010-05-13 ENCOUNTER — Other Ambulatory Visit: Payer: Self-pay | Admitting: Oncology

## 2010-05-13 DIAGNOSIS — C8590 Non-Hodgkin lymphoma, unspecified, unspecified site: Secondary | ICD-10-CM

## 2010-05-15 LAB — TSH: TSH: 2.897 u[IU]/mL (ref 0.350–4.500)

## 2010-05-15 LAB — FOLATE: Folate: >20 ng/mL

## 2010-05-15 LAB — VITAMIN B12: Vitamin B-12: 736 pg/mL (ref 211–911)

## 2010-05-15 LAB — IMMUNOFIXATION ELECTROPHORESIS
IgG (Immunoglobin G), Serum: 531 mg/dL — ABNORMAL LOW (ref 694–1618)
Total Protein, Serum Electrophoresis: 6.6 g/dL (ref 6.0–8.3)

## 2010-07-09 LAB — GLUCOSE, CAPILLARY: Glucose-Capillary: 90 mg/dL (ref 70–99)

## 2010-07-31 LAB — GLUCOSE, CAPILLARY: Glucose-Capillary: 78 mg/dL (ref 70–99)

## 2010-08-05 LAB — CBC
MCHC: 33.8 g/dL (ref 30.0–36.0)
RBC: 2.87 MIL/uL — ABNORMAL LOW (ref 4.22–5.81)
WBC: 5.6 10*3/uL (ref 4.0–10.5)

## 2010-08-05 LAB — BASIC METABOLIC PANEL
CO2: 22 mEq/L (ref 19–32)
Calcium: 8.2 mg/dL — ABNORMAL LOW (ref 8.4–10.5)
Creatinine, Ser: 0.88 mg/dL (ref 0.4–1.5)
GFR calc Af Amer: >60 mL/min (ref >60)
GFR calc non Af Amer: >60 mL/min (ref >60)

## 2010-08-05 LAB — MAGNESIUM: Magnesium: 1.9 mg/dL (ref 1.5–2.5)

## 2010-08-05 LAB — GLUCOSE, CAPILLARY
Glucose-Capillary: 109 mg/dL — ABNORMAL HIGH (ref 70–99)
Glucose-Capillary: 162 mg/dL — ABNORMAL HIGH (ref 70–99)

## 2010-08-12 ENCOUNTER — Encounter (HOSPITAL_BASED_OUTPATIENT_CLINIC_OR_DEPARTMENT_OTHER): Payer: Medicare Other | Admitting: Oncology

## 2010-08-12 ENCOUNTER — Other Ambulatory Visit: Payer: Self-pay | Admitting: Oncology

## 2010-08-12 DIAGNOSIS — IMO0002 Reserved for concepts with insufficient information to code with codable children: Secondary | ICD-10-CM

## 2010-08-12 DIAGNOSIS — D649 Anemia, unspecified: Secondary | ICD-10-CM

## 2010-08-12 DIAGNOSIS — C8401 Mycosis fungoides, lymph nodes of head, face, and neck: Secondary | ICD-10-CM

## 2010-08-12 DIAGNOSIS — Z86718 Personal history of other venous thrombosis and embolism: Secondary | ICD-10-CM

## 2010-08-12 DIAGNOSIS — K5900 Constipation, unspecified: Secondary | ICD-10-CM

## 2010-08-12 LAB — COMPREHENSIVE METABOLIC PANEL
AST: 30 U/L (ref 0–37)
Alkaline Phosphatase: 99 U/L (ref 39–117)
BUN: 16 mg/dL (ref 6–23)
Calcium: 9.6 mg/dL (ref 8.4–10.5)
Creatinine, Ser: 1.43 mg/dL (ref 0.40–1.50)

## 2010-08-12 LAB — CBC WITH DIFFERENTIAL/PLATELET
Basophils Absolute: 0 10*3/uL (ref 0.0–0.1)
Eosinophils Absolute: 0.1 10*3/uL (ref 0.0–0.5)
LYMPH%: 22.2 % (ref 14.0–49.0)
MCV: 97.9 fL (ref 79.3–98.0)
MONO%: 13.2 % (ref 0.0–14.0)
NEUT#: 3.1 10*3/uL (ref 1.5–6.5)
Platelets: 197 10*3/uL (ref 140–400)
RBC: 3.22 10*6/uL — ABNORMAL LOW (ref 4.20–5.82)

## 2010-09-02 NOTE — Discharge Summary (Signed)
NAME:  Willie, [name] NO.:  [id number]   MEDICAL RECORD NO.:  [id number]          PATIENT TYPE:  INP   LOCATION:  1320                         FACILITY:  [facility]   PHYSICIAN:  Genene Churn. Granfortuna, M.D.DATE OF BIRTH:  [birth year]   DATE OF ADMISSION:  01/24/2008  DATE OF DISCHARGE:  01/27/2008                               DISCHARGE SUMMARY   Mr. 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  He was treated with chemotherapy with Cytoxan, Adriamycin,  vincristine, and prednisone for 6 cycles and achieved a complete  remission.   He presented in August of this year with sudden onset of right inguinal  pain.  CT scan done initially read as a small area of hemorrhage in the  area of the right iliopsoas muscle.  No adenopathy noted.  I had planned  to repeat an interval scan in 8 to 10 weeks but the patient developed  rapidly progressive inguinal pain then erythema and a nodular rash over  the right inguinal ligament area.  CT scan along with a PET scan was  done on January 13, 2008.  There now appeared to be a malignant  process infiltrating the right iliopsoas and iliacus muscles extending  to the inferior abdominal wall muscles which was markedly hypermetabolic  on PET scan and consistent with recurrent lymphoma.  Needle biopsy done  January 19, 2008, was diagnostic for recurrent lymphoma with a similar  appearance to the 2003 cells.   The patient has had rapid progression of changes in the right inguinal  region.  He was admitted at this time to urgently begin salvage  chemotherapy.   INITIAL EXAM:  Well-nourished Caucasian man.  Weight 161, down 10 pounds  from May of this year.  Blood pressure 155/91.  Pulse 113 regular.  Respirations 20.  Temperature 97.7.   PERTINENT PHYSICAL  FINDINGS:  Included chronic strabismus.  No  lymphadenopathy.  No mass or organomegaly in the abdomen.  In the right  inguinal region, there was an extensive, confluent, erythematous,  raised, firm area.  In some areas, there was blister formation.  The  entire area was indurated without clearly palpable lymphadenopathy.  There was trace edema of the right calf which was firm but not tender.  No neurologic deficits.   BASELINE LAB DATA:  Showed hemoglobin 11, hematocrit 33, white count  10,000, platelets 479,000, BUN 12, creatinine 0.9, bilirubin 0.4, LDH  120.   HOSPITAL COURSE:  He was given vigorous hydration overnight and started  on allopurinol.  Then based on height of 5 feet 9 inches, weight 161,  pounds body surface area 1.9 m2, beginning on January 25, 2008, he  received chemotherapy with etoposide 100 mg/m2, total dose 190 mg daily  x3 days, carboplatin AUC 5 with estimated creatinine clearance 75 mL per  minute, total dose 500 mg IV over 30 minutes day 2, and ifosfamide 5  g/m2, total dose 9.5 g mixed with an equivalent amount  of mesna  uroprotectant given by continuous IV infusion over 24 hours beginning  day 2 after the carboplatin and etoposide doses.   He tolerated the treatment well.  There were no complications and he was  discharged with chemotherapy was completed on January 27, 2008.   CONSULTATIONS:  None.   PROCEDURES:  1. Chemotherapy for relapsed lymphoma.  2. Placement of a PICC catheter for chemotherapy   DISCHARGE DIAGNOSES:  1. Relapsed T-cell non-Hodgkin lymphoma, high grade.  2. Mild hypertension.  3. Right inguinal cellulitis, reaction to relapsed lymphoma.   DISPOSITION:  Condition stable at time of discharge.  He will resume  regular diet and regular activity.  He will report to my office tomorrow  to receive a dose of Neulasta to support his white blood cell count  through the recent chemotherapy.   ADDITIONAL MEDICATION:  To include:  1.  Allopurinol 300 mg daily x 7 days.  2. Metoprolol 25 mg b.i.d.  3. Omega-3 fish oil 1000 mg capsule daily.  4. Lysine 500 mg daily.  5. B12 500 mcg daily.  6. Multivitamin one daily.   ADDITIONAL DATA OBTAINED DURING THIS ADMISSION/>  Included an electrocardiogram which showed normal sinus rhythm with a  right bundle branch block.   Repeat CBC on January 24, 2008, hemoglobin 10.5, hematocrit 32, white  count 11,600, 84% neutrophils, platelets 572,000, BUN 12, creatinine  1.0, bilirubin 0.8.  Transaminases, alkaline phosphatase normal, albumin  decreased 2.7, calcium 8.6, LDH 90.      Genene Churn. Cyndie Chime, M.D.  Electronically Signed     JMG/MEDQ  D:  01/31/2008  T:  01/31/2008  Job:  [id number]   cc:   Soyla Murphy. Renne Crigler, M.D.  Fax: [phone]

## 2010-09-02 NOTE — H&P (Signed)
NAME:  Willie [name], Willie [name] NO.:  [id number]   MEDICAL RECORD NO.:  [id number]          PATIENT TYPE:  INP   LOCATION:  1320                         FACILITY:  [facility]   PHYSICIAN:  Genene Churn. Granfortuna, M.D.DATE OF BIRTH:  06-Sep-1940   DATE OF ADMISSION:  01/24/2008  DATE OF DISCHARGE:                              HISTORY & PHYSICAL   REASON FOR ADMISSION:  Relapsed T-cell non-Hodgkin lymphoma.   HPI:  Willie [name] is a pleasant 70 year old man initially diagnosed with  a high-grade, ALK-positive, T-cell lymphoma in March of 2003 when he  presented with a left inguinal lymph node mass with associated left  lower extremity lymphedema and a deep venous thrombosis.  He was treated  with Cytoxan, Adriamycin, vincristine, and prednisone for 6 cycles and  achieved a complete remission.   He presented on December 12, 2007, with acute onset of right inguinal pain  radiating to his buttock.  A CT scan was done and read initially as a  small hematoma in the area of the iliopsoas muscle.  Of note, he had no  history of trauma and was not on any anticoagulation.  I felt scan was  suspicious and planned to repeat a followup study in 6 to 8 weeks.   He developed rapidly progressive pain and induration and a nodular  erythematous rash along the right inguinal ligament and on exam had new  findings compared with just 4 weeks previously with induration along the  inguinal ligament and an atypical, raised, nodular, spotty rash in the  same area.  A repeat CT scan along with a PET scan was done on January 13, 2008.  Now there was clearly abnormal density and thickening along  the right iliopsoas and iliacus muscle extending to the inferior  abdominal wall muscles consistent with a malignant process infiltrating  these muscles.  PET scan showed high metabolic activity in the same area  over a 9 x 3 x 6-cm region.   A needle biopsy was done on January 19, 2008.  Although the core biopsy  was nondiagnostic, needle cytology in a different area of the mass did  yield tissue diagnostic for recurrent lymphoma with a similar appearance  as the 2003 cells.  Patient was called in today when we received the  results of the biopsy.   On exam today which will be detailed below, there has been rapid  progression of changes in the right inguinal region.  Patient is being  admitted on an urgent basis to initiate salvage chemotherapy.   PAST MEDICAL HISTORY:  Notable only for mild hypertension.  Previous DVT  associated with the lymphoma treated with 6 months of Coumadin therapy.  No other major medical or surgical illness.   CURRENT MEDICATIONS:  Include:  1. Metoprolol 25 mg b.i.d.  2. Omega-3 fish oil 1000 mg daily.  3. Tussionex 5 mg b.i.d. started for recent bronchitis.  4. L-Lysine 500 mg daily.  5. B12 at 500 mcg daily.  6. Multivitamins 1 daily.   No allergies.   FAMILY HISTORY:  No malignant disease.   SOCIAL HISTORY:  He is  married. Wife recently diagnosed with stage I  breast cancer. He has 2 daughters.  He works on [employer].  He does not  smoke or use alcohol.   REVIEW OF SYSTEMS:  He has had some intermittent mild headaches.  No  change in vision.  He has strabismus.  He had a recent cough with fever  and I treated him with a 7-day course of azithromycin.  No ischemic-type  chest pain, palpitations, or dyspnea.  No abdominal pain.  No change in  bowel habit.  He tends to be constipated.  No dysuria or frequency.  No  bone pain.  No paresthesias.   PHYSICAL EXAM:  Well-nourished Caucasian man.  Weight 161.5 compared  with 171.5 in May of this year.  Blood pressure 155/91.  Pulse 113,  regular.  Respirations 20.  Temperature 97.7.  There is strabismus.  Pupils are equal and reactive to light.  Pharynx, no erythema, exudate,  or mass.  NECK:  Supple.  No thyromegaly or thyroid mass.  LUNGS:  Clear and resonant to percussion.  Regular cardiac rhythm.  No murmur.   There is no cervical,  supraclavicular, or axillary lymphadenopathy.  ABDOMEN:  Soft and nontender.  No mass.  No organomegaly.  In the right  inguinal region, there is an extensive, confluent, erythematous, raised,  firm area.  Some areas where there is blister formation.  Entire area is  indurated without clearly palpable lymph nodes.  There is trace edema of the right calf which is firmer than the left but  not tender measuring 39.5 cm compared with 37.5 on the left.  Ankles 22  cm and symmetric.  NEUROLOGIC:  With mental status intact.  Cranial  nerves intact.  Motor strength 5/5.  Reflexes 1+ symmetric.  Coordination normal.  Vibration intact to tuning fork exam over the  fingertips.   RECENT LAB:  Done on January 09, 2005, with hemoglobin 11, hematocrit  33, white count 10,000, platelets 479,000, BUN 12, creatinine 0.9,  bilirubin 0.4, LDH 120.   IMPRESSION:  1. Relapse of T-cell non-Hodgkin lymphoma, high grade, involving      iliopsoas and iliacus muscle extending to the subcutaneous area in      the right inguinal region.  Extensive cutaneous reaction overlying      the progressive infiltrating tumor.  2. Mild hypertension.   PLAN:  Overnight hydration, place a double lumen PICC catheter, start  allopurinol, begin chemotherapy with ICE:  etoposide 100 mg/m2 IV, day  1, 2, and 3, carboplatin AUC 5, day 2, ifosfamide 5 g/m2 by continuous  IV infusion mixed with equivalent amount of mesna uroprotectant over 24  hours starting on day 2.  Based on height 5 feet 9 inches, weight 161  pounds, body surface area 1.9 m2, total dose of etoposide will be 190 mg  daily x3, carboplatin based on serum creatinine 0.9, estimated  creatinine clearance by Calvert formula 75 mL per minute and AUC 5 will  be 500 mg, ifosfamide total dose 9.5 g with 9.5 g of mesna.      Genene Churn. Cyndie Chime, M.D.  Electronically Signed    JMG/MEDQ  D:  01/24/2008  T:  01/25/2008  Job:  [id number]   cc:    Soyla Murphy. Renne Crigler, M.D.  Fax: [phone]

## 2010-09-02 NOTE — Discharge Summary (Signed)
NAME:  Willie [name], Willie [name] NO.:  [id number]   MEDICAL RECORD NO.:  [id number]          PATIENT TYPE:  INP   LOCATION:  1307                         FACILITY:  [facility]   PHYSICIAN:  Genene Churn. Granfortuna, M.D.DATE OF BIRTH:  [birth year]   DATE OF ADMISSION:  06/05/2008  DATE OF DISCHARGE:  06/07/2008                               DISCHARGE SUMMARY   REASON FOR ADMISSION:  Dehydration, cough, dyspnea.   HISTORY OF PRESENT ILLNESS:  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  His initial diagnosis dates to March 2003 at which time he was treated  with CHOP chemotherapy with complete remission.  In August 2009, he  developed recurrent disease infiltrating into the iliopsoas and  surrounding muscles.  He was treated with ice chemotherapy with the  first three cycles given in [place] and the fourth cycle given at  [facility].  Following cycle four, he underwent Neupogen  priming with subsequent collection of stem cells.  He received high-dose  chemotherapy at [facility] with BCNU on May 02, 2008, VP-16 on  May 04, 2008 and cyclophosphamide on May 06, 2008 with mesna.  This was followed by stem cell infusion on May 08, 2008.   [name] presented to the office on June 05, 2008, for routine  follow-up.  At that time, he reported a 1-week history of dizziness and  lightheadedness with position changes.  He noted no improvement  following a decrease in his temporal dose.  He also reported a cough and  dyspnea.  He had been started on a course of Avelox, but noted no  improvement in the respiratory symptoms.  He was subsequently admitted  for further evaluation.   PHYSICAL EXAMINATION:  VITAL SIGNS:  Temperature 97, heart rate 123,  respirations 20, blood pressure 116/84 and weight 149.2 pounds.  GENERAL:  A chronically ill-appearing gentleman in no acute  distress.  HEENT:  Normocephalic, atraumatic.  Pupils equal, react to light.  Strabismus.  Sclerae anicteric.  Oropharynx appeared dry.  No thrush.  CHEST:  Fine, dry rales at both lung bases.  No respiratory distress.  CARDIOVASCULAR:  Regular, tachycardiac.  No JVD.  ABDOMEN:  Soft.  No organomegaly. complete regression of induration,  erythema, and ulceration in the right groin area.  EXTREMITIES:  No edema.  SKIN:  Dry appearing.  Decreased skin turgor.  NEURO:  Nonfocal.   HOSPITAL COURSE:  [name] was admitted to the Oncology unit at  [facility] with dehydration and a recent cough and dyspnea.  Admission lab work showed hemoglobin 11.6, white, 9.2, absolute  neutrophil count 3.5, platelet count 156,000.  Sodium 134, potassium  4.2, chloride 105, CO2 23, glucose 100, BUN 8, creatinine 0.91,  bilirubin 0.7, alkaline phosphatase 101, SGOT 39, SGPT 22, total protein  6.1, albumin 3.1, calcium 8.7, LDH 169, uric acid 3.1.  Admission chest  x-ray showed no evidence of an acute infiltrate or pleural effusion.  There was no mass or adenopathy noted.  Pulmonary interstitial  prominence was stable.  Heart size  was normal.   Intravenous hydration was initiated.  The lightheadedness and dizziness  with position changes resolved.  [name] was tolerating an oral diet  throughout the hospitalization without difficulty.   Dr. Cyndie Chime was concerned that the cough and dyspnea on exertion as  well as physical exam findings with rales at both lung bases were due to  pulmonary toxicity possibly related to the BCNU chemotherapy.  Mr.  Dobosz was started on a course of Solu-Medrol on June 06, 2008.  PFTs were obtained,  DLCO was only slightly decreased at 77% of control.  As noted above, the admission chest x-ray was negative for infiltrate.  Mr. Champeau noted improvement in the cough and dyspnea following the  first dose of Solu-Medrol.  He will receive a second IV dose  prior to  discharge today and will begin a prednisone taper on June 08, 2008.  He is aware that he should contact the office if his cough worsens  again, he develops increased shortness of breath, fever or any other  problems.   On June 06, 2008, serum glucose returned elevated at 213.  This lab  was obtained prior to beginning the Solu-Medrol.  The value was felt to  be inaccurate.  He has no history of hyperglycemia/diabetes mellitus.  A  follow-up capillary blood glucose level approximately 2 hours later was  109, and on the day of discharge, capillary blood glucose returned at  97.   On June 07, 2008 Mr. Harm was felt to be stable for discharge  home.  The symptoms he was experiencing on admission had either resolve  were significantly improved.  He was ambulating in the hall without  difficulty and tolerating a regular diet.  We will schedule a follow-up  visit in the next 1-2 weeks.   CONSULTATIONS:  None.   PROCEDURES:  None.   DISCHARGE DIAGNOSES:  1. 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  2. Dyspnea/cough with likely mild chemical pneumonitis from recent      BCNU chemotherapy.  Symptoms improved at discharge.  3. Elevated serum glucose on the lab work June 06, 2008 felt to be      inaccurate with subsequent CBG readings in an acceptable range.  4. Hypertension.  5. Coronary artery disease status post coronary artery bypass graft in      2007.   DISCHARGE DISPOSITION:  1. Condition improved.  2. Activity:  Increase activity slowly.  3. Diet:  No restrictions.  4. Wound care:  Routine care of Hickman catheter.  5. Special instructions:  Call with increased cough, shortness of      breath or any other problems.  6. Follow-up with Dr. Cyndie Chime.  Office will call with appointment       time.   DISCHARGE MEDICATIONS:  1. Folic acid 1 mg daily.  2. Slow-Mag 64 mg 2 tablets twice daily.  3. Septra DS one tablet Monday, Wednesday, Friday.  4. Acyclovir 800 mg twice daily.  5. Toprol XL 50 mg daily.  6. Valium 5 mg 1-2 tablets every 8 hours as needed.  7. Percocet 5/325 1-2 tablets of 4 hours as needed.  8. Compazine 5 mg 1-2 tablets every 6 hours as needed.  9. Prednisone 40 mg daily for one week, then 30 mg daily for one week,  then 20 mg daily for one week, then 10 mg daily for one week, then      stop.  10.Mycelex Troche 10 mg dissolved on tongue four times daily while on      steroids.  11.Multivitamin daily.  12.B12 500 mcg daily.  13.Lysine 500 mg daily.  14.Fish Oil concentrate 1000 mg daily.      Lonna Cobb, N.P.      Genene Churn. Cyndie Chime, M.D.  Electronically Signed    LT/MEDQ  D:  06/07/2008  T:  06/07/2008  Job:  [id number]   cc:   Soyla Murphy. Renne Crigler, M.D.  Fax: [phone]   Antionette Char, MD  Fax: [phone]   Evelene Croon, M.D.  [address] [place] [address]  [place]  Fax: [phone]

## 2010-09-02 NOTE — H&P (Signed)
NAME:  Willie [name], Willie [name] NO.:  [id number]   MEDICAL RECORD NO.:  [id number]          PATIENT TYPE:  INP   LOCATION:  1307                         FACILITY:  [facility]   PHYSICIAN:  Genene Churn. Granfortuna, M.D.DATE OF BIRTH:  11/09/1940   DATE OF ADMISSION:  06/05/2008  DATE OF DISCHARGE:                              HISTORY & PHYSICAL   CHIEF COMPLAINT:  Weak and lightheaded.   HISTORY OF PRESENT ILLNESS:  Willie [name] is a 70 year old gentleman with  recent recurrence of ALK-positive T cell anaplastic large-cell lymphoma.  Initial diagnosis dates to March 2003 when he presented with left  inguinal lymphadenopathy, lymphedema, and a DVT.  He was treated with  CHOP chemotherapy with complete remission.  In August 2009, he developed  recurrent disease infiltrating into the iliopsoas and surrounding  muscles.  The recurrence was biopsy proven October 2009.  He completed 4  cycles of ICE chemotherapy with a prompt and dramatic response.  He  completed the first 3 cycles in [place] and the 4th cycle at [facility].  Following cycle 4, he underwent Neupogen priming  with subsequent collection of stem cells. He then received high-dose  chemotherapy at [facility] as follows:   1. BCNU 15 mg/kg, for a total dose of 1080 mg on May 02, 2008.  2. VP-16, 60 mg/kg, for a total dose of 4380 mg on May 04, 2008.  3. Cyclophosphamide 100 mg/kg, for a total dose of 7300 mg on May 06, 2008 with mesna.   The stem cell infusion took place on May 08, 2008.  Overall he did  well.  He did have some problems with a renal stone and transient rise  in his creatinine and fall in blood pressure.  His condition stabilized  without any major intervention.   Willie [name] presented to the office for routine followup on June 05, 2008.  At that time he reported a 1-week history of dizziness and  lightheadedness with position changes.  His Toprol dose had  been  decreased to 25 mg daily the week prior with no improvement in his  symptoms.  He also reported a cough, occasionally expectorating clear  phlegm.  The cough has been present for several days.  He notes that the  cough is worse when he lays flat.  He also notes dyspnea and that his  heart is racing when he lays flat.  He was started on Avelox by his  physicians at [facility] on June 01, 2008.  He has noted no improvement  in his symptoms since beginning Avelox.  He denies any fever.  He has  had no nausea, vomiting or diarrhea.  He is tolerating liquids but his  wife notes that his appetite overall is poor.  Willie [name] has noted  significant improvement in lower extremity edema over the past week.   PAST MEDICAL HISTORY:  1. Hypertension.  2. CAD, status post coronary bypass graft surgery, March 29, 2006.  3. Hyperlipidemia.   MEDICATIONS:  1. Multivitamin daily.  2. B12, 500 mcg daily.  3. Lysine 500  mg daily.  4. Fish oil concentrate 1000 mg daily.  5. Compazine 5 mg as needed.  6. Acyclovir 800 mg twice daily.  7. Folic acid 1 tablet daily.  8. Toprol XL 25 mg daily.  9. Slow-Mag 64 mg 4 tablets daily.  10.Valium 5 mg 1-2 tablets every 6 hours as needed.  11.Percocet 5/325 one to two tabs every 4 hours as needed.  12.Avelox 400 mg daily.  13.Bactrim DS 1 tablet Monday, Wednesday, Friday.   ALLERGIES:  No known drug allergies.   FAMILY HISTORY:  Noncontributory.   SOCIAL HISTORY:  Willie [name] is married.  He lives in [place].  He  has 2 daughters.  He does not smoke or use alcohol.   REVIEW OF SYSTEMS:  No fever.  No unusual headaches or vision change.  Lightheadedness and dizziness with position changes.  He reports  adequate fluid intake.  His wife notes that his overall appetite is  poor.  He has recently noted dyspnea when he lays flat.  He is also  having a cough which worsens when he lays flat.  He is occasionally  expectorating clear phlegm.  He  intermittently notes that his heart is  racing.  He denies any chest pain.  Leg swelling has improved  significantly over the past week.  He denies any nausea, vomiting or  diarrhea.  Bowels moving.  No urinary symptoms.   PHYSICAL EXAM:  VITAL SIGNS:  Temperature 97, heart rate 123,  respirations 20, blood pressure 116/84, weight 149.2 pounds.  GENERAL:  Chronically ill-appearing man in no acute distress.  HEENT:  Normocephalic, atraumatic.  Pupils are equal, round and reactive  to light.  Strabismus.  Sclerae anicteric.  Oropharynx is dry appearing.  No thrush.  CHEST:  Dry Rales at both lung bases.  No respiratory distress.  CARDIOVASCULAR:  Regular, tachycardiac.  No JVD.  ABDOMEN:  Soft.  No organomegaly.  EXTREMITIES:  No edema.  SKIN:  Dry appearing.  Skin turgor is decreased.  NEURO:  Alert and oriented.  Follows commands.  Motor strength 5/5 and  the DTRs are 2+, symmetric.   LABORATORY DATA:  Hemoglobin 11.6, white count 9.2, absolute neutrophil  count 3.5, platelet count 156,000.  Sodium 134, potassium 4.2, chloride  105, CO2 23, glucose 100, BUN 8, creatinine 0.91, bilirubin 0.7,  alkaline phosphatase 101, SGOT 39, SGPT 22, total protein 6.1, albumin  3.1, calcium 8.7, LDH 169, uric acid 3.1.   IMPRESSION AND PLAN:  1. Relapsed anaplastic lymphoma kinase positive  T  large-cell      lymphoma, initially diagnosed March 2003, status post CHOP      chemotherapy with complete remission; recurrence involving right      iliac and iliopsoas muscles August 2009, biopsy proven October      2009, status post ICE chemotherapy for 4 cycles followed by high-      dose chemotherapy at [facility] with autologous stem cell support      May 08, 2008. Now back in remission.  2. Dehydration.  25 lb weight loss in one week. Possible post-      obstructive diuresis from recent acute tubular necrosis related to      obstructing renal stone.  3. Cough, dyspnea.  Rule out opportunistic  infection vs pulmonary      toxicity from recent high dose chemotherapy.  4. Coronary artery disease, status post bypass surgery December 2007.   PLAN:  1. Admit for IV fluids.  2. Check chest  x-ray, EKG.  3. Monitor electrolytes.   Patient interviewed and examined by Dr. Cyndie Chime; plan per Dr.  Cyndie Chime.      Lonna Cobb, N.P.      Genene Churn. Cyndie Chime, M.D.  Electronically Signed    LT/MEDQ  D:  06/06/2008  T:  06/06/2008  Job:  [id number]   cc:   Soyla Murphy. Renne Crigler, M.D.  Fax: [phone]   Antionette Char, MD  Fax: [phone]   Evelene Croon, M.D.  [address] [place] [address]  [place]  Fax: [phone]

## 2010-09-05 NOTE — Op Note (Signed)
NAME:  Willie [name], Willie [name] NO.:  [id number]   MEDICAL RECORD NO.:  [id number]          PATIENT TYPE:  INP   LOCATION:  2307                         FACILITY:  [facility]   PHYSICIAN:  Evelene Croon, M.D.     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   POSTOPERATIVE DIAGNOSIS:  Left main and severe three-vessel coronary  disease.   OPERATIVE PROCEDURE:  Median sternotomy, extracorporeal circulation,  coronary bypass graft surgery x5 using a left internal mammary artery  graft to the left anterior descending coronary artery, with a saphenous  vein graft to the diagonal branch of the LAD, a sequential saphenous  vein graft to the first and second obtuse marginal branches of the left  circumflex coronary artery, and a saphenous vein graft to the  posterolateral branch of the right coronary.  Endoscopic vein harvesting  from the right leg.   ATTENDING SURGEON:  Dr. Evelene Croon.   ASSISTANT:  Dr. Edwina Barth.   SECOND ASSISTANT:  Pecola Leisure, PA-C.   ANESTHESIA:  General endotracheal.   CLINICAL HISTORY:  This patient is a 70 year old gentleman referred by  Dr. Charolette Child who had an abnormal electrocardiogram noted by his  primary physician, Dr. Renne Crigler. He underwent a stress Cardiolite exam that  was abnormal.  This showed mid anterior and mid apical reversible  ischemia. He subsequently had cardiac catheterization on February 25, 2006 which showed a calcified left main coronary artery with diffuse 50%  stenosis.  The LAD had 95% ostial stenosis.  There was a short proximal  segment before the first diagonal branch.  The LAD was completely  occluded after the first diagonal branch with faint filling of the  distal vessel by collaterals from the right coronary artery.  The left  circumflex  had a first margin that had 90% ostial stenosis and a  moderate size second marginal that had no significant disease in it.  The right coronary artery was a dominant vessel with 30% mid vessel  stenosis and about 50% or greater distal stenosis before the  posterolateral branch. Left ventricular function was well-preserved with  mild anterior hypokinesis.  Ejection fraction was 60-70%.  There was no  evidence of aortic stenosis or mitral regurgitation.  After review of  the angiogram and examination of the patient, it was felt that coronary  artery bypass graft surgery was the best treatment to prevent further  ischemia or infarction.  I discussed the operative procedure with the  patient and his wife.  We discussed alternatives, benefits, and risks  including bleeding, blood transfusion, infection, stroke, myocardial  infarction, graft failure, and death.  He understood and agreed to  proceed.   OPERATIVE PROCEDURE:  The patient was taken to the operating room and  placed on the table in the supine position.  After induction of general  endotracheal anesthesia, a Foley catheter was placed in the bladder  using sterile technique.  Then the chest, abdomen and  both lower  extremities were prepped and draped in the usual sterile manner.  The  chest was entered through a median sternotomy incision, the pericardium  opened in the midline.  Examination of the heart showed good ventricular  contractility.  The ascending aorta had no palpable plaques in it.   Then the left internal mammary artery was harvested from the chest wall  as pedicle graft.  This is a medium caliber vessel with excellent blood  flow through it.  At the same time, the segment of greater saphenous  vein was harvested from the right leg using endoscopic vein harvest  technique.  This vein was a medium size and good quality.   The patient was heparinized and when an adequate activated clotting time  was achieved, the distal  ascending aorta was cannulated using a 20-  [place] aortic cannula for arterial inflow.  Venous outflow was achieved  using a two-stage venous cannula through the right atrial appendage.  An  antegrade cardioplegia and vent cannula was inserted in the aortic root.   The patient was placed on cardiopulmonary bypass and distal coronary  artery was identified.  The LAD was a large graftable vessel.  The  diagonal branch was a moderate size vessel that was heavily diseased  proximally but both distal branches of it were suitable for grafting.  The first marginal was a small to medium size vessel that was graftable.  The second marginal was lying beneath a large vein proximally and then  in its mid portion where it was visible for grafting.  It was a  relatively small vessel but it was felt to be graftable.  The right  coronary artery was a large dominant vessel that gave off large  posterior descending and posterolateral branches neither of which had  any disease in them.  There was a significant stenosis noted in the  distal right coronary artery before the posterolateral branch.   Then the aorta was crossclamped and 500 mL of cold blood antegrade  cardioplegia was administered in the aortic root with quick arrest of  the heart.  Systemic hypothermia to 20 degrees centigrade and topical  hypothermia with iced saline was used.  A temperature probe was placed  in the septum and insulating pad in the pericardium.  I had inserted a  retrograde cardioplegia cannula through the right atrium and the  coronary sinus but with antegrade cardioplegia the septal temperature  decreased to 10 degrees centigrade without difficulty and therefore I  never used the retrograde cannula for cardioplegia.   Then the first distal anastomosis was performed to the posterolateral  branch of the right coronary.  The internal diameter was about 2.5 mm. The conduit used was a segment of greater saphenous vein.  The   anastomosis performed in an end-to-side manner using continuous 7-0  Prolene suture.  Flow was noted through the graft and was excellent.   The second distal anastomosis was performed to the first marginal  branch.  The internal diameter of this also was about 1.5 to 1.6 mm.  The conduit used was a second segment of greater saphenous vein and the  anastomosis performed in a sequential side-to-side manner using  continuous 7-0 Prolene suture.  Flow was noted through the graft and was  excellent.   The third distal anastomosis was performed to the second marginal  branch.  The internal diameter was about 1.5 mm.  The conduit used was  the same segment of greater saphenous vein. The  anastomosis performed in  a sequential end-to-side manner using continuous 7-0 Prolene suture.  Flow was noted through the graft and was excellent.  Then another dose  of cardioplegia was given down the vein grafts and the aortic root.   The fourth distal anastomosis was performed to the diagonal branch.  The  internal diameter was about 1.6 mm. The conduit used was a third segment  of greater saphenous vein and the anastomosis performed in an end-to-  side manner using continuous 7-0 Prolene suture.  Flow was noted through  the graft and was excellent.   The fifth distal anastomosis was then performed to the mid portion of  the left anterior descending coronary artery.  The internal diameter was  about 1.6 mm.  The conduit used was the left internal mammary graft and  this was brought through an opening in the left pericardium anterior to  the phrenic nerve. It was anastomosed to the LAD in end-to-side manner  using continuous 8-0 Prolene suture.  The pedicle was sutured to the  epicardium with 6-0 Prolene sutures.  The patient was then rewarmed to  37 degrees centigrade.  Another dose of cardioplegia was given and then  the 3 proximal vein graft anastomoses were performed in the aortic root  in end-to-side  manner continuous 6-0 Prolene suture.  The clamp was then  removed from the mammary pedicle.  There was rapid warming of the  ventricular septum and return of spontaneous ventricular fibrillation.  The crossclamp was removed with a time of 79 minutes and the patient  spontaneously converted to sinus rhythm.  The proximal and distal  anastomoses appeared hemostatic and __________  grafts satisfactory.  Graft markers were placed around the proximal anastomosis.  Two  temporary right ventricular and right atrial pacing wires were placed  and brought through the skin.   When the patient had rewarmed to 37 degrees centigrade, he was weaned  from cardiopulmonary bypass on no inotropic agents.  Total bypass time  was 100 minutes.  Cardiac function appeared excellent.  Protamine was  given and the venous and aortic cannula was removed without difficulty. Hemostasis was achieved.  Three chest tubes were placed with a tube in  the posterior pericardium, one in the left pleural space, one in the  actual mediastinum.  The pericardium was reapproximated over the heart.  The sternum was closed with #6 stainless steel wire.  The fascia was  closed with continuous #1 Vicryl suture.  The subcutaneous tissue was  closed with continuous 2-0 Vicryl and the skin with a 3-0 Vicryl  subcuticular closure. The lower extremity vein harvest site was closed  in layers in a similar manner.  The sponge, needle and instrument counts  were correct according to the scrub nurse.  Dry sterile dressings were  applied over the incisions around the chest tubes __________  Pleur-Evac  suction.  The patient remained hemodynamically stable and was  transported to the SICU in guarded but stable condition.           ______________________________  Evelene Croon, M.D.     BB/MEDQ  D:  03/29/2006  T:  03/30/2006  Job:  [id number]   cc:   Antionette Char, MD  Cardiac Cath Lab

## 2010-09-05 NOTE — Consult Note (Signed)
[facility]  Patient:    Willie [name], Willie [name] Visit Number: [id number] MRN: [id number]          Service Type: MED Location: 3W [id number] [id number] Attending Physician:  Londell Moh Dictated by:   Genene Churn. Cyndie Chime, M.D. Admit Date:  06/28/2001   CC:         Soyla Murphy. Renne Crigler, M.D.  Velora Heckler, M.D.   Consultation Report  REASON FOR CONSULTATION:  This is a hematology/oncology consultation to evaluate this 70 year old man for lymphadenopathy.  HISTORY OF PRESENT ILLNESS:  The patient is a pleasant 70 year old man who has been in overall excellent health. He was initially evaluated in July 2001 for left inguinal lymphadenopathy which resolved completely on antibiotics. He did have CT scans at that time. He now presents with a 1-week history of swelling of his left lower extremity. A CT scan was done on June 24, 2001, which showed left iliac adenopathy with aggregate lymph nodes up to 5.4 cm compressing th left iliac vein. In addition, there was periaortic adenopathy up to 2.8 cm. Spleen and liver were normal. An ultrasound was done which suggested there was a clot in the iliac vein. This was confirmed by a venogram done today. The patient is admitted for anticoagulation and to expedite the evaluation of the lymphadenopathy.  He reports no fever or constitutional symptoms. He has had a deliberate 10-pound weight loss over the last few months after going on a low-fat diet. He has no history of hepatitis or mononucleosis in the past.  PAST MEDICAL HISTORY:  Mild hypertension treated with Maxzide 25 mg 1/2 tablet daily, hyperlipidemia treated with diet, abnormal cardiogram with an abnormal stress test by Dr. Aleen Campi. No clinical history of an MI.  PAST SURGICAL HISTORY:  He had a skin tumor excised from his chest wall about 10 years ago by Dr. Para Skeans. He had a cyst removed from the back of his head many years ago which was benign.  Tonsillectomy as a child. No other surgeries.  OTHER MEDICATIONS:  Niacin and aspirin 81 mg. He takes some echinacea and other vitamins including fish oil recently recommended to lower his lipids.  ALLERGIES:  No known drug allergies.  FAMILY HISTORY:  Father died in his 69s of heart disease; mother died in her late 52s of lung cancer; 1 brother died at age 30 of acute leukemia; 1 sister has mental problems; 1 sister is alive and well.  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 He has 2 healthy daughters.  PHYSICAL EXAMINATION:  GENERAL:  A healthy-appearing man. The estimated weight is 175 pounds. The height is 5 feet 10 inches.  HEENT:  The skin, hair and nails are normal. Pupils are equal, round, and reactive to light. There is strabismus. The pharynx without erythema, exudate, or mass.  NECK:  Supple, no thyromegaly, no thyroid mass. Carotids are 2+, no bruits.  LUNGS:  Clear and resonant to percussion.  HEART:  Regular cardiac rhythm. No murmur.  LYMPHS:  No lymphadenopathy in the neck, supraclavicular, or axillary regions. There is a palpable 2-cm node in the left inguinal region.  ABDOMEN:  Soft, nontender, without mass or organomegaly. There is swelling of the left lower extremity and erythema on the medial aspect of his thigh.  RECTAL:  Not done.  GU:  There are no testicular masses.  NEUROLOGIC:  Grossly normal. Motor strength is 5/5. Reflexes are 2+ and symmetric.  LABORATORY AND  ACCESSORY DATA:  Outpatient labs done through Dr. Shellia Cleverly office on June 27, 2001, with hemoglobin 12.5 compared with 14.8 on November 03, 1999, white count 11,500, platelet count of 540,000. Chemistry profile is normal including an LDH of 135 (normal less than 250) and uric acid of 5.7.  SUMMARY AND IMPRESSION:  This is a 70 year old man who presents with left lower extremity swelling and was found to have retroperitoneal and iliac lymphadenopathy with  associated compression of the left iliac vein. He has no constitutional symptoms and no signs of a hypermetabolic situation with a normal LDH and uric acid.  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  RECOMMENDATIONS:  I agree with surgical consultation for an incisional biopsy of a lymph node to obtain adequate tissue for a firm diagnosis and special studies including flow cytometry.  I will do bilateral posterior iliac crest bone marrow biopsies if the lymphoma diagnosis is confirmed.  I agree with short-term anticoagulation for deep venous thrombosis until we can treat the underlying process causing the lymphadenopathy.  Thank you for this consultation. Dictated by:   Genene Churn. Cyndie Chime, M.D. Attending Physician:  Londell Moh DD:  06/28/01 TD:  06/29/01 Job: [id number]

## 2010-11-04 ENCOUNTER — Other Ambulatory Visit: Payer: Self-pay | Admitting: Oncology

## 2010-11-04 ENCOUNTER — Encounter (HOSPITAL_BASED_OUTPATIENT_CLINIC_OR_DEPARTMENT_OTHER): Payer: Medicare Other | Admitting: Oncology

## 2010-11-04 DIAGNOSIS — C8409 Mycosis fungoides, extranodal and solid organ sites: Secondary | ICD-10-CM

## 2010-11-04 DIAGNOSIS — Z86718 Personal history of other venous thrombosis and embolism: Secondary | ICD-10-CM

## 2010-11-04 DIAGNOSIS — Z23 Encounter for immunization: Secondary | ICD-10-CM

## 2010-11-04 LAB — COMPREHENSIVE METABOLIC PANEL
ALT: 28 U/L (ref 0–53)
AST: 27 U/L (ref 0–37)
Chloride: 104 mEq/L (ref 96–112)
Creatinine, Ser: 1.45 mg/dL — ABNORMAL HIGH (ref 0.50–1.35)
Sodium: 139 mEq/L (ref 135–145)
Total Bilirubin: 0.3 mg/dL (ref 0.3–1.2)
Total Protein: 6.3 g/dL (ref 6.0–8.3)

## 2010-11-04 LAB — CBC WITH DIFFERENTIAL/PLATELET
BASO%: 0.2 % (ref 0.0–2.0)
EOS%: 1.9 % (ref 0.0–7.0)
HCT: 29.2 % — ABNORMAL LOW (ref 38.4–49.9)
LYMPH%: 23.5 % (ref 14.0–49.0)
MCH: 33.7 pg — ABNORMAL HIGH (ref 27.2–33.4)
MCHC: 34.7 g/dL (ref 32.0–36.0)
MONO#: 0.6 10*3/uL (ref 0.1–0.9)
NEUT%: 62.8 % (ref 39.0–75.0)
RBC: 3.01 10*6/uL — ABNORMAL LOW (ref 4.20–5.82)
WBC: 4.9 10*3/uL (ref 4.0–10.3)
lymph#: 1.2 10*3/uL (ref 0.9–3.3)

## 2010-11-06 ENCOUNTER — Encounter (HOSPITAL_COMMUNITY): Payer: Self-pay

## 2010-11-06 ENCOUNTER — Ambulatory Visit (HOSPITAL_COMMUNITY)
Admission: RE | Admit: 2010-11-06 | Discharge: 2010-11-06 | Disposition: A | Discharge status: Home / Self Care | Payer: Medicare Other | Source: Ambulatory Visit | Attending: Oncology | Admitting: Oncology

## 2010-11-06 DIAGNOSIS — K7689 Other specified diseases of liver: Secondary | ICD-10-CM | POA: Insufficient documentation

## 2010-11-06 DIAGNOSIS — Z9221 Personal history of antineoplastic chemotherapy: Secondary | ICD-10-CM | POA: Insufficient documentation

## 2010-11-06 DIAGNOSIS — C8589 Other specified types of non-Hodgkin lymphoma, extranodal and solid organ sites: Secondary | ICD-10-CM | POA: Insufficient documentation

## 2010-11-06 DIAGNOSIS — C8590 Non-Hodgkin lymphoma, unspecified, unspecified site: Secondary | ICD-10-CM

## 2010-11-06 DIAGNOSIS — Z9489 Other transplanted organ and tissue status: Secondary | ICD-10-CM | POA: Insufficient documentation

## 2010-11-06 DIAGNOSIS — I517 Cardiomegaly: Secondary | ICD-10-CM | POA: Insufficient documentation

## 2010-11-06 DIAGNOSIS — N62 Hypertrophy of breast: Secondary | ICD-10-CM | POA: Insufficient documentation

## 2010-11-06 HISTORY — DX: Essential (primary) hypertension: I10

## 2010-11-11 ENCOUNTER — Encounter (HOSPITAL_BASED_OUTPATIENT_CLINIC_OR_DEPARTMENT_OTHER): Payer: Medicare Other | Admitting: Oncology

## 2010-11-11 DIAGNOSIS — D649 Anemia, unspecified: Secondary | ICD-10-CM

## 2010-11-11 DIAGNOSIS — C8401 Mycosis fungoides, lymph nodes of head, face, and neck: Secondary | ICD-10-CM

## 2010-11-11 DIAGNOSIS — Z86718 Personal history of other venous thrombosis and embolism: Secondary | ICD-10-CM

## 2011-01-19 LAB — DIFFERENTIAL
Basophils Absolute: 0
Basophils Relative: 0
Eosinophils Absolute: 0.2
Monocytes Absolute: 0.8
Neutro Abs: 9.7 — ABNORMAL HIGH
Neutrophils Relative %: 84 — ABNORMAL HIGH

## 2011-01-19 LAB — PROTIME-INR
INR: 1.2
Prothrombin Time: 15.5 — ABNORMAL HIGH

## 2011-01-19 LAB — COMPREHENSIVE METABOLIC PANEL
Alkaline Phosphatase: 68
BUN: 12
CO2: 26
Chloride: 101
Glucose, Bld: 88
Potassium: 4.1
Total Bilirubin: 0.8

## 2011-01-19 LAB — CBC
HCT: 32.2 — ABNORMAL LOW
Hemoglobin: 10.5 — ABNORMAL LOW
RBC: 3.73 — ABNORMAL LOW
WBC: 11.6 — ABNORMAL HIGH

## 2011-01-19 LAB — EXPECTORATED SPUTUM ASSESSMENT W GRAM STAIN, RFLX TO RESP C

## 2011-01-19 LAB — URINALYSIS, ROUTINE W REFLEX MICROSCOPIC
Bilirubin Urine: NEGATIVE
Hgb urine dipstick: NEGATIVE
Ketones, ur: NEGATIVE
Nitrite: NEGATIVE
pH: 7.5

## 2011-01-19 LAB — GLUCOSE, CAPILLARY: Glucose-Capillary: 91

## 2011-01-19 LAB — CULTURE, RESPIRATORY W GRAM STAIN

## 2011-01-22 LAB — GLUCOSE, CAPILLARY: Glucose-Capillary: 90 mg/dL (ref 70–99)

## 2011-01-22 LAB — CROSSMATCH

## 2011-01-22 LAB — ABO/RH: ABO/RH(D): O NEG

## 2011-03-05 ENCOUNTER — Other Ambulatory Visit: Payer: Self-pay | Admitting: *Deleted

## 2011-03-05 ENCOUNTER — Telehealth: Payer: Self-pay | Admitting: *Deleted

## 2011-03-05 DIAGNOSIS — R634 Abnormal weight loss: Secondary | ICD-10-CM

## 2011-03-05 DIAGNOSIS — C8595 Non-Hodgkin lymphoma, unspecified, lymph nodes of inguinal region and lower limb: Secondary | ICD-10-CM

## 2011-03-05 NOTE — Telephone Encounter (Signed)
Received call from pt's wife, Willie [name] stating she is concerned about pts weight loss.  She states he is eating good & seems to feel good but is a little tired in the eve after working but this isn't unusual.  She reports his wt is down to 149 & also he is a little pale.  She would like to get him in for lab particularly thyroid check & hgb & then see Dr. Cyndie Chime.  She reports that pt has an appt in Dec with PCP but she doesn't want to wait that long.  Note to Dr. Cyndie Chime.

## 2011-03-05 NOTE — Telephone Encounter (Signed)
[name] notified that schedulers will call her for a lab appt for pt tomorrow per Dr. Cyndie Chime.

## 2011-03-06 ENCOUNTER — Other Ambulatory Visit: Payer: Self-pay | Admitting: Oncology

## 2011-03-06 ENCOUNTER — Other Ambulatory Visit (HOSPITAL_BASED_OUTPATIENT_CLINIC_OR_DEPARTMENT_OTHER): Payer: Medicare Other | Admitting: Lab

## 2011-03-06 DIAGNOSIS — R634 Abnormal weight loss: Secondary | ICD-10-CM

## 2011-03-06 DIAGNOSIS — C8595 Non-Hodgkin lymphoma, unspecified, lymph nodes of inguinal region and lower limb: Secondary | ICD-10-CM

## 2011-03-06 DIAGNOSIS — D649 Anemia, unspecified: Secondary | ICD-10-CM

## 2011-03-06 DIAGNOSIS — C8401 Mycosis fungoides, lymph nodes of head, face, and neck: Secondary | ICD-10-CM

## 2011-03-06 LAB — CBC WITH DIFFERENTIAL/PLATELET
BASO%: 0.2 % (ref 0.0–2.0)
Basophils Absolute: 0 10*3/uL (ref 0.0–0.1)
Eosinophils Absolute: 0.2 10*3/uL (ref 0.0–0.5)
HCT: 30.9 % — ABNORMAL LOW (ref 38.4–49.9)
HGB: 10.5 g/dL — ABNORMAL LOW (ref 13.0–17.1)
LYMPH%: 23.7 % (ref 14.0–49.0)
MONO#: 0.8 10*3/uL (ref 0.1–0.9)
NEUT#: 3 10*3/uL (ref 1.5–6.5)
NEUT%: 56.5 % (ref 39.0–75.0)
Platelets: 189 10*3/uL (ref 140–400)
WBC: 5.4 10*3/uL (ref 4.0–10.3)
lymph#: 1.3 10*3/uL (ref 0.9–3.3)

## 2011-03-06 LAB — COMPREHENSIVE METABOLIC PANEL
ALT: 24 U/L (ref 0–53)
ALT: 24 U/L (ref 0–53)
Albumin: 4.3 g/dL (ref 3.5–5.2)
Alkaline Phosphatase: 113 U/L (ref 39–117)
CO2: 26 mEq/L (ref 19–32)
CO2: 26 mEq/L (ref 19–32)
Calcium: 9.6 mg/dL (ref 8.4–10.5)
Chloride: 104 mEq/L (ref 96–112)
Creatinine, Ser: 1.41 mg/dL — ABNORMAL HIGH (ref 0.50–1.35)
Glucose, Bld: 84 mg/dL (ref 70–99)
Glucose, Bld: 84 mg/dL (ref 70–99)
Potassium: 4.2 mEq/L (ref 3.5–5.3)
Sodium: 138 mEq/L (ref 135–145)
Total Bilirubin: 0.5 mg/dL (ref 0.3–1.2)
Total Bilirubin: 0.5 mg/dL (ref 0.3–1.2)
Total Protein: 6 g/dL (ref 6.0–8.3)
Total Protein: 6 g/dL (ref 6.0–8.3)

## 2011-03-06 LAB — TSH: TSH: 3.018 u[IU]/mL (ref 0.350–4.500)

## 2011-03-06 LAB — LACTATE DEHYDROGENASE
LDH: 143 U/L (ref 94–250)
LDH: 143 U/L (ref 94–250)

## 2011-04-23 ENCOUNTER — Other Ambulatory Visit: Payer: Self-pay | Admitting: Oncology

## 2011-04-23 ENCOUNTER — Telehealth: Payer: Self-pay | Admitting: Oncology

## 2011-04-23 DIAGNOSIS — C8590 Non-Hodgkin lymphoma, unspecified, unspecified site: Secondary | ICD-10-CM

## 2011-04-23 NOTE — Telephone Encounter (Signed)
Called pt,left message regarding appt for January 2013

## 2011-04-28 ENCOUNTER — Other Ambulatory Visit: Payer: Medicare Other | Admitting: Lab

## 2011-04-29 ENCOUNTER — Other Ambulatory Visit: Payer: Self-pay | Admitting: Oncology

## 2011-04-29 ENCOUNTER — Ambulatory Visit (HOSPITAL_COMMUNITY)
Admission: RE | Admit: 2011-04-29 | Discharge: 2011-04-29 | Disposition: A | Discharge status: Home / Self Care | Payer: Medicare Other | Source: Ambulatory Visit | Attending: Oncology | Admitting: Oncology

## 2011-04-29 ENCOUNTER — Other Ambulatory Visit (HOSPITAL_BASED_OUTPATIENT_CLINIC_OR_DEPARTMENT_OTHER): Payer: Medicare Other

## 2011-04-29 DIAGNOSIS — N62 Hypertrophy of breast: Secondary | ICD-10-CM | POA: Insufficient documentation

## 2011-04-29 DIAGNOSIS — C8590 Non-Hodgkin lymphoma, unspecified, unspecified site: Secondary | ICD-10-CM

## 2011-04-29 DIAGNOSIS — K7689 Other specified diseases of liver: Secondary | ICD-10-CM | POA: Insufficient documentation

## 2011-04-29 DIAGNOSIS — C8589 Other specified types of non-Hodgkin lymphoma, extranodal and solid organ sites: Secondary | ICD-10-CM | POA: Insufficient documentation

## 2011-04-29 DIAGNOSIS — D649 Anemia, unspecified: Secondary | ICD-10-CM

## 2011-04-29 DIAGNOSIS — Z9484 Stem cells transplant status: Secondary | ICD-10-CM | POA: Insufficient documentation

## 2011-04-29 DIAGNOSIS — C8401 Mycosis fungoides, lymph nodes of head, face, and neck: Secondary | ICD-10-CM

## 2011-04-29 DIAGNOSIS — Z9221 Personal history of antineoplastic chemotherapy: Secondary | ICD-10-CM | POA: Insufficient documentation

## 2011-04-29 DIAGNOSIS — I700 Atherosclerosis of aorta: Secondary | ICD-10-CM | POA: Insufficient documentation

## 2011-04-29 LAB — CBC WITH DIFFERENTIAL/PLATELET
Eosinophils Absolute: 0.1 10*3/uL (ref 0.0–0.5)
HCT: 32.5 % — ABNORMAL LOW (ref 38.4–49.9)
LYMPH%: 23.2 % (ref 14.0–49.0)
MONO#: 0.5 10*3/uL (ref 0.1–0.9)
NEUT#: 3.3 10*3/uL (ref 1.5–6.5)
NEUT%: 64.8 % (ref 39.0–75.0)
Platelets: 184 10*3/uL (ref 140–400)
WBC: 5.1 10*3/uL (ref 4.0–10.3)

## 2011-04-29 LAB — CMP (CANCER CENTER ONLY)
CO2: 29 mEq/L (ref 18–33)
Creat: 1.3 mg/dl — ABNORMAL HIGH (ref 0.6–1.2)
Glucose, Bld: 92 mg/dL (ref 73–118)
Sodium: 143 mEq/L (ref 128–145)
Total Bilirubin: 0.6 mg/dl (ref 0.20–1.60)
Total Protein: 6.6 g/dL (ref 6.4–8.1)

## 2011-04-29 LAB — URIC ACID: Uric Acid, Serum: 3.7 mg/dL — ABNORMAL LOW (ref 4.0–7.8)

## 2011-04-29 LAB — LACTATE DEHYDROGENASE: LDH: 156 U/L (ref 94–250)

## 2011-05-04 ENCOUNTER — Telehealth: Payer: Self-pay

## 2011-05-04 NOTE — Telephone Encounter (Signed)
Pt notified by phone per Dr Cyndie Chime "no sign of recurrence" on 1/9 CT scan.  Pt verbalizes understanding and appreciation. dph

## 2011-05-04 NOTE — Telephone Encounter (Signed)
Message left on [facility] home VM per Dr Cyndie Chime to contact office for CT results. dph

## 2011-05-12 ENCOUNTER — Ambulatory Visit (HOSPITAL_BASED_OUTPATIENT_CLINIC_OR_DEPARTMENT_OTHER): Payer: Medicare Other | Admitting: Nurse Practitioner

## 2011-05-12 VITALS — BP 133/75 | HR 75 | Temp 97.0°F | Wt 153.5 lb

## 2011-05-12 DIAGNOSIS — C8445 Peripheral T-cell lymphoma, not classified, lymph nodes of inguinal region and lower limb: Secondary | ICD-10-CM

## 2011-05-12 NOTE — Progress Notes (Signed)
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 He had an initial complete response to 6 cycles of CHOP chemotherapy. He relapsed October 2009 with extensive infiltration of tumor into the right iliopsoas muscle presenting as an erythematous nodular cutaneous mass in the right inguinal region. No other disease outside of the muscle noted on PET scan. He again achieved remission with ICE chemotherapy and then went onto receive BCNU, etoposide and Cytoxan conditioning followed by autologous stem cell transplant at [facility] in January 2010.  Willie [name] reports that he feels well. He was recently found to have a low testosterone level and was started on AndroGel. No interim illnesses or infections. He has a good appetite. He reports he is gaining weight. No fevers or sweats. He denies pain. No shortness of breath. He has an occasional cough. No chest pain. Bowels moving regularly. He reports nocturia and is currently taking Flomax. Willie [name] reports he was recently diagnosed with osteoporosis and started on calcium and vitamin D 3.   Objective: Blood pressure 133/75, pulse 75, temperature 97 F (36.1 C), temperature source Oral, weight 153 lb 8 oz (69.627 kg).  Oropharynx is without thrush or ulceration. No palpable cervical, supraclavicular, axillary or inguinal lymph nodes. Lungs are clear. No wheezes or rales. Regular cardiac rhythm. Abdomen is soft and nontender. No organomegaly. Extremities are without edema. Calves are soft and nontender.  Lab Results: Lab Results  Component Value Date   WBC 5.1 04/29/2011   HGB 10.8* 04/29/2011   HCT 32.5* 04/29/2011   MCV 96.4 04/29/2011   PLT 184 04/29/2011    Chemistry:    Chemistry      Component Value Date/Time   NA 143 04/29/2011 1201   NA 138 03/06/2011 1040   NA 138 03/06/2011 1040   K 4.2 04/29/2011 1201   K 4.2 03/06/2011 1040   K 4.2 03/06/2011 1040   CL 102 04/29/2011 1201   CL 104 03/06/2011 1040   CL 104 03/06/2011 1040   CO2 29 04/29/2011 1201   CO2 26 03/06/2011 1040   CO2 26 03/06/2011 1040   BUN 15 04/29/2011 1201   BUN 19 03/06/2011 1040   BUN 19 03/06/2011 1040   CREATININE 1.3* 04/29/2011 1201   CREATININE 1.41* 03/06/2011 1040   CREATININE 1.41* 03/06/2011 1040      Component Value Date/Time   CALCIUM 9.2 04/29/2011 1201   CALCIUM 9.6 03/06/2011 1040   CALCIUM 9.6 03/06/2011 1040   ALKPHOS 111* 04/29/2011 1201   ALKPHOS 113 03/06/2011 1040   ALKPHOS 113 03/06/2011 1040   AST 30 04/29/2011 1201   AST 28 03/06/2011 1040   AST 28 03/06/2011 1040   ALT 24 03/06/2011 1040   ALT 24 03/06/2011 1040   BILITOT 0.60 04/29/2011 1201   BILITOT 0.5 03/06/2011 1040   BILITOT 0.5 03/06/2011 1040       Studies/Results: Ct Abdomen Pelvis Wo Contrast  04/29/2011  *RADIOLOGY REPORT*  Clinical Data:  Lymphoma, status post stem cell transplant and chemotherapy.  CT CHEST, ABDOMEN AND PELVIS WITHOUT CONTRAST  Technique:  Multidetector CT imaging of the chest, abdomen and pelvis was performed following the standard protocol without IV contrast.  Comparison:  11/06/2010  CT CHEST  Findings:  There are small scattered mediastinal lymph nodes, none pathologically enlarged or change.  No  axillary or hilar adenopathy. Heart is normal size. Aorta is normal caliber.  Prior CABG Lungs are clear.  No focal airspace opacities or suspicious nodules.  No effusions.  Minimal scarring in the upper lobes bilaterally  Mild bilateral gynecomastia again noted, unchanged.  Chest wall soft tissues otherwise unremarkable.  No acute bony abnormality.  IMPRESSION: No acute findings.  No evidence of active lymphoma.  CT ABDOMEN AND PELVIS  Findings:  Small right hepatic lobe cyst again noted, unchanged. No new lesions visualized on this unenhanced study.   Gallbladder, spleen, pancreas, adrenals and kidneys have an unremarkable unenhanced appearance.  Aorta is calcified, non-aneurysmal. Urinary bladder is unremarkable.  Moderate stool burden throughout the colon. Bowel grossly unremarkable.  No free fluid, free air, or adenopathy. Small bilateral inguinal lymph nodes again noted, unchanged.  No acute bony abnormality.  IMPRESSION: No acute findings or evidence of active lymphoma within the abdomen or pelvis.  Original Report Authenticated By: Cyndie Chime, M.D.   Ct Chest Wo Contrast  04/29/2011  *RADIOLOGY REPORT*  Clinical Data:  Lymphoma, status post stem cell transplant and chemotherapy.  CT CHEST, ABDOMEN AND PELVIS WITHOUT CONTRAST  Technique:  Multidetector CT imaging of the chest, abdomen and pelvis was performed following the standard protocol without IV contrast.  Comparison:  11/06/2010  CT CHEST  Findings:  There are small scattered mediastinal lymph nodes, none pathologically enlarged or change.  No axillary or hilar adenopathy. Heart is normal size. Aorta is normal caliber.  Prior CABG Lungs are clear.  No focal airspace opacities or suspicious nodules.  No effusions.  Minimal scarring in the upper lobes bilaterally  Mild bilateral gynecomastia again noted, unchanged.  Chest wall soft tissues otherwise unremarkable.  No acute bony abnormality.  IMPRESSION: No acute findings.  No evidence of active lymphoma.  CT ABDOMEN AND PELVIS  Findings:  Small right hepatic lobe cyst again noted, unchanged. No new lesions visualized on this unenhanced study.  Gallbladder, spleen, pancreas, adrenals and kidneys have an unremarkable unenhanced appearance.  Aorta is calcified, non-aneurysmal. Urinary bladder is unremarkable.  Moderate stool burden throughout the colon. Bowel grossly unremarkable.  No free fluid, free air, or adenopathy. Small bilateral inguinal lymph nodes again noted, unchanged.  No acute bony abnormality.  IMPRESSION: No acute findings or evidence  of active lymphoma within the abdomen or pelvis.  Original Report Authenticated By: Cyndie Chime, M.D.    Medications: I have reviewed the patient's current medications.  Assessment/Plan:  1. ALK positive T-cell lymphoma.  Initial diagnosis March 2003 presenting with left inguinal lymphadenopathy, lymphedema and a left lower extremity deep venous thrombosis.  Stage IIA disease.  Complete remission with 6 cycles of CHOP chemotherapy.  Relapse in October 2009 with extensive infiltration of tumor into the right iliopsoas muscle presenting as an erythematous nodular cutaneous mass in the right inguinal region.  Disease limited to muscle on PET scan.  He was re-induced into remission with ICE and then received BCNU, etoposide, Cytoxan conditioning followed by autologous stem cell support at [facility] in January 2010.   2. History of weight loss-weight is stable as compared to July 2012. 3. Normochromic anemia. Hemoglobin is stable to slightly improved. Question early MDS. 4. History of deep venous thrombosis related to vascular compression from initial lymphoma. 5. Coronary artery disease status post bypass surgery. 6. 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 7. Nocturia currently on Flomax. 8. Low testosterone currently on AndroGel. 9.  Osteoporosis-he recently began calcium and vitamin D 3.  Disposition-Mr. Willie [name] appears stable. He remains in remission from the lymphoma. He will return for a followup visit in 6 months with labs and CT scans 1 week prior. He will contact the office the interim with any problems.  Lonna Cobb ANP/GNP-BC

## 2011-05-18 ENCOUNTER — Telehealth: Payer: Self-pay

## 2011-05-18 MED ORDER — FERROUS SULFATE 325 (65 FE) MG PO TABS: 325.0000 mg | ORAL_TABLET | Freq: Every day | ORAL | Status: DC

## 2011-05-18 NOTE — Telephone Encounter (Signed)
Received call from pt's wife to notify Misty Stanley, NP that pt has begun taking Ferrous Sulfate 65 daily.  Per Ms Gasior - "Misty Stanley wanted to know which iron he was taking."    Note to Misty Stanley, NP. dph

## 2011-08-03 ENCOUNTER — Other Ambulatory Visit (HOSPITAL_BASED_OUTPATIENT_CLINIC_OR_DEPARTMENT_OTHER): Payer: Medicare Other | Admitting: Lab

## 2011-08-03 DIAGNOSIS — C8445 Peripheral T-cell lymphoma, not classified, lymph nodes of inguinal region and lower limb: Secondary | ICD-10-CM

## 2011-08-03 LAB — CBC WITH DIFFERENTIAL/PLATELET
BASO%: 0.3 % (ref 0.0–2.0)
EOS%: 1.8 % (ref 0.0–7.0)
Eosinophils Absolute: 0.1 10*3/uL (ref 0.0–0.5)
MCHC: 33.5 g/dL (ref 32.0–36.0)
MCV: 98.4 fL — ABNORMAL HIGH (ref 79.3–98.0)
MONO%: 15.6 % — ABNORMAL HIGH (ref 0.0–14.0)
NEUT#: 2.6 10*3/uL (ref 1.5–6.5)
RBC: 3.24 10*6/uL — ABNORMAL LOW (ref 4.20–5.82)
RDW: 13.4 % (ref 11.0–14.6)
nRBC: 0 % (ref 0–0)

## 2011-08-10 ENCOUNTER — Telehealth: Payer: Self-pay | Admitting: *Deleted

## 2011-08-10 NOTE — Telephone Encounter (Signed)
Received vm call from pt's wife, Carley Hammed asking for results of recent labs & request a copy of labs to be mailed to them. This will be done.

## 2011-10-30 ENCOUNTER — Other Ambulatory Visit (HOSPITAL_BASED_OUTPATIENT_CLINIC_OR_DEPARTMENT_OTHER): Payer: Medicare Other | Admitting: Lab

## 2011-10-30 DIAGNOSIS — C8445 Peripheral T-cell lymphoma, not classified, lymph nodes of inguinal region and lower limb: Secondary | ICD-10-CM

## 2011-10-30 LAB — CBC WITH DIFFERENTIAL/PLATELET
BASO%: 0.4 % (ref 0.0–2.0)
EOS%: 4.2 % (ref 0.0–7.0)
HCT: 30.7 % — ABNORMAL LOW (ref 38.4–49.9)
MCHC: 33.4 g/dL (ref 32.0–36.0)
MONO#: 0.6 10*3/uL (ref 0.1–0.9)
RBC: 3.1 10*6/uL — ABNORMAL LOW (ref 4.20–5.82)
RDW: 13.5 % (ref 11.0–14.6)
WBC: 5 10*3/uL (ref 4.0–10.3)
lymph#: 1.2 10*3/uL (ref 0.9–3.3)

## 2011-10-30 LAB — COMPREHENSIVE METABOLIC PANEL
ALT: 16 U/L (ref 0–53)
AST: 23 U/L (ref 0–37)
CO2: 27 mEq/L (ref 19–32)
Calcium: 9.2 mg/dL (ref 8.4–10.5)
Chloride: 104 mEq/L (ref 96–112)
Potassium: 4.1 mEq/L (ref 3.5–5.3)
Sodium: 139 mEq/L (ref 135–145)
Total Protein: 6.3 g/dL (ref 6.0–8.3)

## 2011-10-30 LAB — LACTATE DEHYDROGENASE: LDH: 164 U/L (ref 94–250)

## 2011-11-02 ENCOUNTER — Other Ambulatory Visit: Payer: Self-pay | Admitting: Nurse Practitioner

## 2011-11-02 ENCOUNTER — Ambulatory Visit (HOSPITAL_COMMUNITY)
Admission: RE | Admit: 2011-11-02 | Discharge: 2011-11-02 | Disposition: A | Discharge status: Home / Self Care | Payer: Medicare Other | Source: Ambulatory Visit | Attending: Nurse Practitioner | Admitting: Nurse Practitioner

## 2011-11-02 DIAGNOSIS — K7689 Other specified diseases of liver: Secondary | ICD-10-CM | POA: Insufficient documentation

## 2011-11-02 DIAGNOSIS — Z951 Presence of aortocoronary bypass graft: Secondary | ICD-10-CM | POA: Insufficient documentation

## 2011-11-02 DIAGNOSIS — C8445 Peripheral T-cell lymphoma, not classified, lymph nodes of inguinal region and lower limb: Secondary | ICD-10-CM

## 2011-11-02 DIAGNOSIS — C8449 Peripheral T-cell lymphoma, not classified, extranodal and solid organ sites: Secondary | ICD-10-CM | POA: Insufficient documentation

## 2011-11-02 DIAGNOSIS — J984 Other disorders of lung: Secondary | ICD-10-CM | POA: Insufficient documentation

## 2011-11-02 DIAGNOSIS — I700 Atherosclerosis of aorta: Secondary | ICD-10-CM | POA: Insufficient documentation

## 2011-11-02 DIAGNOSIS — I10 Essential (primary) hypertension: Secondary | ICD-10-CM | POA: Insufficient documentation

## 2011-11-05 ENCOUNTER — Telehealth: Payer: Self-pay | Admitting: *Deleted

## 2011-11-05 NOTE — Telephone Encounter (Signed)
Message copied by Sabino Snipes on Thu Nov 05, 2011  9:16 AM ------      Message from: Levert Feinstein      Created: Mon Nov 02, 2011  5:40 PM       Call pt - CT  No recurrence of lymphoma

## 2011-11-05 NOTE — Telephone Encounter (Signed)
Pt notified via vm on identified message of CT result per Dr. Cyndie Chime.  Informed to call if questions.

## 2011-11-10 ENCOUNTER — Telehealth: Payer: Self-pay | Admitting: *Deleted

## 2011-11-10 ENCOUNTER — Ambulatory Visit (HOSPITAL_BASED_OUTPATIENT_CLINIC_OR_DEPARTMENT_OTHER): Payer: Medicare Other | Admitting: Oncology

## 2011-11-10 ENCOUNTER — Ambulatory Visit (HOSPITAL_BASED_OUTPATIENT_CLINIC_OR_DEPARTMENT_OTHER): Payer: Medicare Other | Admitting: Lab

## 2011-11-10 ENCOUNTER — Encounter: Payer: Self-pay | Admitting: Oncology

## 2011-11-10 VITALS — BP 141/82 | HR 76 | Temp 96.7°F | Ht 69.0 in | Wt 152.3 lb

## 2011-11-10 DIAGNOSIS — I2510 Atherosclerotic heart disease of native coronary artery without angina pectoris: Secondary | ICD-10-CM

## 2011-11-10 DIAGNOSIS — E291 Testicular hypofunction: Secondary | ICD-10-CM

## 2011-11-10 DIAGNOSIS — Z86718 Personal history of other venous thrombosis and embolism: Secondary | ICD-10-CM

## 2011-11-10 DIAGNOSIS — C8445 Peripheral T-cell lymphoma, not classified, lymph nodes of inguinal region and lower limb: Secondary | ICD-10-CM

## 2011-11-10 DIAGNOSIS — C8449 Peripheral T-cell lymphoma, not classified, extranodal and solid organ sites: Secondary | ICD-10-CM

## 2011-11-10 DIAGNOSIS — M818 Other osteoporosis without current pathological fracture: Secondary | ICD-10-CM

## 2011-11-10 DIAGNOSIS — T387X5A Adverse effect of androgens and anabolic congeners, initial encounter: Secondary | ICD-10-CM

## 2011-11-10 DIAGNOSIS — R7989 Other specified abnormal findings of blood chemistry: Secondary | ICD-10-CM

## 2011-11-10 DIAGNOSIS — Z951 Presence of aortocoronary bypass graft: Secondary | ICD-10-CM

## 2011-11-10 DIAGNOSIS — D649 Anemia, unspecified: Secondary | ICD-10-CM

## 2011-11-10 DIAGNOSIS — N183 Chronic kidney disease, stage 3 unspecified: Secondary | ICD-10-CM

## 2011-11-10 DIAGNOSIS — R351 Nocturia: Secondary | ICD-10-CM | POA: Insufficient documentation

## 2011-11-10 HISTORY — DX: Other osteoporosis without current pathological fracture: M81.8

## 2011-11-10 HISTORY — DX: Other specified abnormal findings of blood chemistry: R79.89

## 2011-11-10 HISTORY — DX: Anemia, unspecified: D64.9

## 2011-11-10 HISTORY — DX: Nocturia: R35.1

## 2011-11-10 LAB — BASIC METABOLIC PANEL
CO2: 27 mEq/L (ref 19–32)
Calcium: 9.6 mg/dL (ref 8.4–10.5)
Chloride: 104 mEq/L (ref 96–112)
Glucose, Bld: 90 mg/dL (ref 70–99)
Sodium: 140 mEq/L (ref 135–145)

## 2011-11-10 NOTE — Telephone Encounter (Signed)
gave patient appointment for 05-03-2012 starting at 11:30am printed out calendar and gave patient

## 2011-11-10 NOTE — Progress Notes (Signed)
Unexplained increase in creatinine; only new meds Toviaz, an anticholinergic & IM testosterone Hematology and Oncology Follow Up Visit  Willie [name] [id number] 07-Jun-1940 71 y.o. 11/10/2011 12:11 PM   Principle Diagnosis: Encounter Diagnoses  Name Primary?  . 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 Chronic renal insufficiency, stage III (moderate)   . CAD in native artery   . S/P CABG x 5   . History of DVT of lower extremity   . Low serum testosterone level   . 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 Nocturia   . 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 He had an initial complete response to 6 cycles of CHOP chemotherapy. He relapsed October 2009 with extensive infiltration of tumor into the right iliopsoas muscle presenting as an erythematous nodular cutaneous mass in the right inguinal region. No other disease outside of the muscle noted on PET scan. He again achieved remission with ICE chemotherapy and then went onto receive BCNU, etoposide and Cytoxan conditioning followed by autologous stem cell transplant at [facility] in January 2010. He has achieved what to date has been a durable remission including CT scans done in anticipation of today's visit which I personally reviewed done on July 15.  He has had no interim medical problems. He has been seen by Dr. Juleen China, endocrinology. He has a low testosterone level. He was initially put on AndroGel which he did not tolerate. He felt that caused palpitations. He is now on monthly testosterone injections.  He has been stable from cardiac standpoint and is now out 5 years from 5 vessel bypass surgery in December of 2007.  He has had a recent bone density study in January of this year  through his primary care physician's office and was told that he has osteoporosis. His wife Sjogren the report which confirms this. He is on calcium and vitamin D supplements.  He has had mild chronic  renal insufficiency with creatinines running 1.3-1.4. However lab done last week in anticipation of today's visit now shows creatinine of 2 with a normal BUN. His only other new medications are Gala Murdoch which is an anticholinergic drug use to help control his nocturia which apparently is not related to an enlarged prostate gland. I look the drug up and he has no renal side effects. He is on no other nephrotoxic drugs.   Medications: reviewed  Allergies: No Known Allergies  Review of Systems: Constitutional:   No constitutional symptoms Respiratory: No cough or dyspnea Cardiovascular:  No chest pain or palpitations Gastrointestinal: No abdominal pain no change in bowel habit Genito-Urinary: Nocturia idiopathic Musculoskeletal: No muscle or bone pain Neurologic: No headache or change in vision Skin: No rash Remaining ROS negative.  Physical Exam: Blood pressure 141/82, pulse 76, temperature 96.7 F (35.9 C), temperature source Oral, height 5\' 9"  (1.753 m), weight 152 lb 4.8 oz (69.083 kg). Wt Readings from Last 3 Encounters:  11/10/11 152 lb 4.8 oz (69.083 kg)  05/12/11 153 lb 8 oz (69.627 kg)     General appearance: Thin, pale, Caucasian man HENNT: Pharynx no erythema or exudate Lymph nodes: No cervical, supraclavicular, axillary, or inguinal adenopathy Breasts: Lungs: Clear to auscultation resonant to percussion Heart: Regular rhythm with a 3/6 systolic murmur at the apex and  left sternal border Abdomen: Soft nontender no mass no organomegaly Extremities: No edema no calf tenderness Vascular: No cyanosis Neurologic: strabismus, cranial nerves grossly normal, motor strength 5 over 5, reflexes 1+ symmetric Skin: No rash or ecchymosis  Lab Results: Lab Results  Component Value Date    WBC 5.0 10/30/2011   HGB 10.3* 10/30/2011   HCT 30.7* 10/30/2011   MCV 99.1* 10/30/2011   PLT 160 10/30/2011     Chemistry      Component Value Date/Time   NA 139 10/30/2011 1532   NA 143 04/29/2011 1201   K 4.1 10/30/2011 1532   K 4.2 04/29/2011 1201   CL 104 10/30/2011 1532   CL 102 04/29/2011 1201   CO2 27 10/30/2011 1532   CO2 29 04/29/2011 1201   BUN 16 10/30/2011 1532   BUN 15 04/29/2011 1201   CREATININE 2.04* 10/30/2011 1532   CREATININE 1.3* 04/29/2011 1201      Component Value Date/Time   CALCIUM 9.2 10/30/2011 1532   CALCIUM 9.2 04/29/2011 1201   ALKPHOS 96 10/30/2011 1532   ALKPHOS 111* 04/29/2011 1201   AST 23 10/30/2011 1532   AST 30 04/29/2011 1201   ALT 16 10/30/2011 1532   BILITOT 0.3 10/30/2011 1532   BILITOT 0.60 04/29/2011 1201       Radiological Studies: Ct Abdomen Pelvis Wo Contrast  11/02/2011  *RADIOLOGY REPORT*  Clinical Data: Peripheral T-cell lymphoma.  Hypertension.  CT CHEST, ABDOMEN AND PELVIS WITHOUT CONTRAST  Technique:  Multidetector CT imaging of the chest, abdomen and pelvis was performed following the standard protocol without IV contrast.  Comparison:   None.  CT CHEST  Comparison: 04/29/2011  Findings: Prior CABG noted.  No pathologic thoracic adenopathy. Heart size is within normal limits.  Mild biapical pleuroparenchymal scarring noted.  The lungs appear otherwise clear.  No significant osseous thoracic abnormality noted.  IMPRESSION:  1.  No findings of thoracic malignancy or significant thoracic abnormality.  CT ABDOMEN AND PELVIS  Findings:  Stable 1.2 cm and adjacent 0.8 cm right hepatic lobe hypodense lesions noted, image 56 of series 2.  Several tiny hypodense lesions noted in the left hepatic lobe, technically too small to characterize but stable from prior exams over the last several years.  The spleen, pancreas, adrenal glands, and gallbladder appear unremarkable in noncontrast CT appearance.  The kidneys appear unremarkable, as do the proximal ureters.   Abdominal aortic atherosclerotic calcification noted.  Borderline dilated loops of proximal small bowel noted.  A small amount of oral contrast reaches the colon which demonstrates a mild prominence of stool.  Urinary bladder appears unremarkable.  Disc bulge noted at L4-5 with bridging spurring of the sacroiliac joints.  Retraction of the right testicle into the inguinal canal is suspected.  Small bilateral common iliac and inguinal lymph nodes are present but not pathologically enlarged by size criteria. No pathologic pelvic adenopathy is identified.  IMPRESSION:  1.  No pathologic adenopathy in the abdomen or pelvis. 2.  Borderline dilated small bowel, with mild prominence stool in the colon, query constipation. 3.  Stable small hypodense lesions in the liver appear benign. 4.  Possible retracted testicle in the right inguinal canal  Original Report Authenticated By: Dellia Cloud, M.D.   Ct Chest Wo Contrast  11/02/2011  *RADIOLOGY REPORT*  Clinical Data: Peripheral T-cell lymphoma.  Hypertension.  CT CHEST, ABDOMEN AND PELVIS WITHOUT CONTRAST  Technique:  Multidetector CT imaging of the chest, abdomen and pelvis was performed following the standard  protocol without IV contrast.  Comparison:   None.  CT CHEST  Comparison: 04/29/2011  Findings: Prior CABG noted.  No pathologic thoracic adenopathy. Heart size is within normal limits.  Mild biapical pleuroparenchymal scarring noted.  The lungs appear otherwise clear.  No significant osseous thoracic abnormality noted.  IMPRESSION:  1.  No findings of thoracic malignancy or significant thoracic abnormality.  CT ABDOMEN AND PELVIS  Findings:  Stable 1.2 cm and adjacent 0.8 cm right hepatic lobe hypodense lesions noted, image 56 of series 2.  Several tiny hypodense lesions noted in the left hepatic lobe, technically too small to characterize but stable from prior exams over the last several years.  The spleen, pancreas, adrenal glands, and gallbladder appear  unremarkable in noncontrast CT appearance.  The kidneys appear unremarkable, as do the proximal ureters.  Abdominal aortic atherosclerotic calcification noted.  Borderline dilated loops of proximal small bowel noted.  A small amount of oral contrast reaches the colon which demonstrates a mild prominence of stool.  Urinary bladder appears unremarkable.  Disc bulge noted at L4-5 with bridging spurring of the sacroiliac joints.  Retraction of the right testicle into the inguinal canal is suspected.  Small bilateral common iliac and inguinal lymph nodes are present but not pathologically enlarged by size criteria. No pathologic pelvic adenopathy is identified.  IMPRESSION:  1.  No pathologic adenopathy in the abdomen or pelvis. 2.  Borderline dilated small bowel, with mild prominence stool in the colon, query constipation. 3.  Stable small hypodense lesions in the liver appear benign. 4.  Possible retracted testicle in the right inguinal canal  Original Report Authenticated By: Dellia Cloud, M.D.    Impression and Plan: #1. Relapsed ALK positive T-cell anaplastic lymphoma treated as outlined above. He remains in remission now out almost 4 years from high-dose chemotherapy with autologous stem cell support. I will get another scan again in January 2014. If his scan is negative then I don't see the need for subsequent scans unless based on clinical signs or symptoms.  #2. Progressive deterioration in renal function. This is possibly due to previous effects of chemotherapy on his kidneys. I'm going to repeat a BUN and creatinine today. If still elevated then get a 24-hour urine for creatinine clearance. I discussed this with him and his wife today.  #3. Coronary artery disease status post 5 vessel bypass surgery by Dr. Laneta Simmers 03/29/2006  #4. Chronic normochromic anemia hemoglobin overall stable at around 10 g. I suspect that this may be due to a developing myelodysplastic syndrome from his previous  chemotherapy treatments. We will continue to monitor his counts over time.  #5. Idiopathic nocturia on Flomax  #6. History of left lower remedy DVT as first sign of his lymphoma when he developed an enlarging lymph glands mass in his left inguinal region with associated lymphedema.  #7. 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  #8. Low testosterone level on monthly testosterone injections.  #9. Valvular heart disease with a loud mitral regurgitation murmur.   CC:. Dr. Merri Brunette; Dr. Marlaine Hind at [facility]   Levert Feinstein, MD 7/23/201312:11 PM

## 2011-11-12 ENCOUNTER — Telehealth: Payer: Self-pay | Admitting: *Deleted

## 2011-11-12 NOTE — Telephone Encounter (Signed)
Pt notified of repeat creatinine result being better & back to baseline.  Copy will be sent to Dr Renne Crigler.

## 2012-05-03 ENCOUNTER — Telehealth: Payer: Self-pay | Admitting: Oncology

## 2012-05-03 ENCOUNTER — Ambulatory Visit (HOSPITAL_BASED_OUTPATIENT_CLINIC_OR_DEPARTMENT_OTHER): Payer: Medicare Other | Admitting: Oncology

## 2012-05-03 ENCOUNTER — Other Ambulatory Visit (HOSPITAL_BASED_OUTPATIENT_CLINIC_OR_DEPARTMENT_OTHER): Payer: Medicare Other | Admitting: Lab

## 2012-05-03 ENCOUNTER — Encounter: Payer: Self-pay | Admitting: Oncology

## 2012-05-03 VITALS — BP 127/73 | HR 81 | Temp 97.3°F | Resp 18 | Ht 69.0 in | Wt 142.7 lb

## 2012-05-03 DIAGNOSIS — C8445 Peripheral T-cell lymphoma, not classified, lymph nodes of inguinal region and lower limb: Secondary | ICD-10-CM

## 2012-05-03 DIAGNOSIS — R634 Abnormal weight loss: Secondary | ICD-10-CM | POA: Insufficient documentation

## 2012-05-03 DIAGNOSIS — D649 Anemia, unspecified: Secondary | ICD-10-CM

## 2012-05-03 DIAGNOSIS — N289 Disorder of kidney and ureter, unspecified: Secondary | ICD-10-CM

## 2012-05-03 HISTORY — DX: Abnormal weight loss: R63.4

## 2012-05-03 LAB — CBC WITH DIFFERENTIAL/PLATELET
BASO%: 0.1 % (ref 0.0–2.0)
Basophils Absolute: 0 10*3/uL (ref 0.0–0.1)
EOS%: 1.5 % (ref 0.0–7.0)
HCT: 37.3 % — ABNORMAL LOW (ref 38.4–49.9)
HGB: 12.4 g/dL — ABNORMAL LOW (ref 13.0–17.1)
LYMPH%: 20.5 % (ref 14.0–49.0)
MCH: 32.5 pg (ref 27.2–33.4)
MCHC: 33.2 g/dL (ref 32.0–36.0)
MCV: 97.6 fL (ref 79.3–98.0)
MONO%: 8.3 % (ref 0.0–14.0)
NEUT%: 69.6 % (ref 39.0–75.0)
Platelets: 156 10*3/uL (ref 140–400)
lymph#: 1.4 10*3/uL (ref 0.9–3.3)

## 2012-05-03 LAB — SEDIMENTATION RATE: Sed Rate: 6 mm/hr (ref 0–16)

## 2012-05-03 LAB — COMPREHENSIVE METABOLIC PANEL (CC13)
ALT: 16 U/L (ref 0–55)
Alkaline Phosphatase: 83 U/L (ref 40–150)
Creatinine: 1.7 mg/dL — ABNORMAL HIGH (ref 0.7–1.3)
Glucose: 105 mg/dl — ABNORMAL HIGH (ref 70–99)
Sodium: 140 mEq/L (ref 136–145)
Total Bilirubin: 0.6 mg/dL (ref 0.20–1.20)
Total Protein: 6.6 g/dL (ref 6.4–8.3)

## 2012-05-03 NOTE — Progress Notes (Signed)
Hematology and Oncology Follow Up Visit  Willie [name] [id number] 1940-06-20 72 y.o. 05/03/2012 1:45 PM   Principle Diagnosis: Encounter Diagnosis  Name Primary?  . 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 He had an initial complete response to 6 cycles of CHOP chemotherapy. He relapsed October 2009 with extensive infiltration of tumor into the right iliopsoas muscle presenting as an erythematous nodular cutaneous mass in the right inguinal region. No other disease outside of the muscle noted on PET scan. He again achieved remission with ICE chemotherapy and then went onto receive BCNU, etoposide and Cytoxan conditioning followed by autologous stem cell transplant at [facility] in January 2010. He has achieved what to date has been a durable remission and is now 4 years from the transplant dose chemotherapy.  He has had no interim medical problems. Unfortunately, despite good appetite, he continues to lose weight and is down to 143 pounds compared with 153 pounds last January and 152 pounds in July 2013. His primary care physician started him on testosterone injections when endocrine evaluation revealed low testosterone levels. When we scanned him back in July there was no evidence for recurrent lymphoma. He has not had a change in bowel habit and if anything,  his hemoglobin is better today than previous values. I forgot to ask him when his last colonoscopy was.   Medications: reviewed  Allergies: No Known Allergies  Review of Systems: Constitutional:   Ongoing weight loss despite good appetite Respiratory: No cough or dyspnea Cardiovascular:  No chest pain or palpitations Gastrointestinal: No abdominal  pain Genito-Urinary: No urinary tract symptoms Musculoskeletal: No muscle, bone, or joint pain Neurologic: No headache or change in vision Skin: No rash or ecchymosis Remaining ROS negative.  Physical Exam: Blood pressure 127/73, pulse 81, temperature 97.3 F (36.3 C), temperature source Oral, resp. rate 18, height 5\' 9"  (1.753 m), weight 142 lb 11.2 oz (64.728 kg). Wt Readings from Last 3 Encounters:  05/03/12 142 lb 11.2 oz (64.728 kg)  11/10/11 152 lb 4.8 oz (69.083 kg)  05/12/11 153 lb 8 oz (69.627 kg)     General appearance: Thin Caucasian man HENNT: Pharynx no erythema or exudate Lymph nodes: No cervical, supraclavicular, axillary, or inguinal adenopathy Breasts: Lungs: Clear to auscultation resonant to percussion Heart: Regular rhythm with loud 3-4/6 systolic murmur loudest at the cardiac apex Abdomen: Soft, nontender, no mass, no organomegaly Extremities: No edema, no calf tenderness Vascular: No cyanosis Neurologic: Motor strength 5 over 5, reflexes 1+ symmetric Skin: No rash or ecchymosis  Lab Results: Lab Results  Component Value Date   WBC 6.7 05/03/2012   HGB 12.4* 05/03/2012   HCT 37.3* 05/03/2012   MCV 97.6 05/03/2012   PLT 156 05/03/2012     Chemistry      Component Value Date/Time   NA 140 05/03/2012 1149   NA 140 11/10/2011 1221   NA 143 04/29/2011 1201   K 4.4 05/03/2012 1149   K 4.4 11/10/2011 1221   K 4.2 04/29/2011 1201   CL 106 05/03/2012 1149   CL 104 11/10/2011 1221   CL 102 04/29/2011 1201   CO2 25 05/03/2012 1149   CO2 27 11/10/2011 1221   CO2 29 04/29/2011 1201   BUN 18.0 05/03/2012 1149  BUN 16 11/10/2011 1221   BUN 15 04/29/2011 1201   CREATININE 1.7* 05/03/2012 1149   CREATININE 1.51* 11/10/2011 1221   CREATININE 1.3* 04/29/2011 1201      Component Value Date/Time   CALCIUM 9.3 05/03/2012 1149   CALCIUM 9.6 11/10/2011 1221   CALCIUM 9.2 04/29/2011 1201   ALKPHOS 83 05/03/2012 1149   ALKPHOS 96 10/30/2011 1532   ALKPHOS 111* 04/29/2011 1201   AST 22  05/03/2012 1149   AST 23 10/30/2011 1532   AST 30 04/29/2011 1201   ALT 16 05/03/2012 1149   ALT 16 10/30/2011 1532   BILITOT 0.60 05/03/2012 1149   BILITOT 0.3 10/30/2011 1532   BILITOT 0.60 04/29/2011 1201      Impression and Plan: #1. ALK positive T-cell anaplastic lymphoma in second remission. No obvious signs of recurrence although his progressive weight loss remains a concern. He has no localizing findings on exam. An order for a CT scan was not put in for this visit. He is due to be seen at [facility] next month.  #2. Progressive weight loss. No obvious reason. We checked thyroid functions back in November 2012 and TSH and free T4 were normal. CT scans in July 2013 with no obvious recurrence of lymphoma. It may be worthwhile getting a routine colonoscopy.  #3. Chronic renal insufficiency. Creatinine is ranging between 1.4 and 1.7.  #4. Coronary artery disease status post 5 vessel bypass surgery December 2007  #5. Valvular heart disease-mitral regurgitation He is due to see his cardiologist in followup tomorrow.  #6. Chronic anemia. Likely due to his renal insufficiency. Hemoglobin actually better than his baseline today at 12 g.  #7. 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  #8. Remote left lower extremity DVT as presenting sign of his lymphoma secondary to left inguinal lymphadenopathy.   CC:. Dr. Merri Brunette; Dr. Marlaine Hind, [facility]   Levert Feinstein, [place] 1/14/20141:45 PM

## 2012-05-03 NOTE — Telephone Encounter (Signed)
appts made and printed for pt aom °

## 2012-05-18 ENCOUNTER — Other Ambulatory Visit (HOSPITAL_COMMUNITY): Payer: Self-pay | Admitting: Cardiovascular Disease

## 2012-05-18 DIAGNOSIS — Z951 Presence of aortocoronary bypass graft: Secondary | ICD-10-CM

## 2012-05-18 DIAGNOSIS — R011 Cardiac murmur, unspecified: Secondary | ICD-10-CM

## 2012-05-31 ENCOUNTER — Ambulatory Visit (HOSPITAL_COMMUNITY)
Admission: RE | Admit: 2012-05-31 | Discharge: 2012-05-31 | Disposition: A | Discharge status: Home / Self Care | Payer: Medicare Other | Source: Ambulatory Visit | Attending: Cardiovascular Disease | Admitting: Cardiovascular Disease

## 2012-05-31 DIAGNOSIS — I379 Nonrheumatic pulmonary valve disorder, unspecified: Secondary | ICD-10-CM | POA: Insufficient documentation

## 2012-05-31 DIAGNOSIS — I2510 Atherosclerotic heart disease of native coronary artery without angina pectoris: Secondary | ICD-10-CM | POA: Insufficient documentation

## 2012-05-31 DIAGNOSIS — R011 Cardiac murmur, unspecified: Secondary | ICD-10-CM | POA: Insufficient documentation

## 2012-05-31 DIAGNOSIS — I059 Rheumatic mitral valve disease, unspecified: Secondary | ICD-10-CM | POA: Insufficient documentation

## 2012-05-31 DIAGNOSIS — I10 Essential (primary) hypertension: Secondary | ICD-10-CM | POA: Insufficient documentation

## 2012-05-31 DIAGNOSIS — I369 Nonrheumatic tricuspid valve disorder, unspecified: Secondary | ICD-10-CM | POA: Insufficient documentation

## 2012-05-31 NOTE — Progress Notes (Signed)
[facility]   2D echo completed 05/31/2012.   Cindy Jarrick Fjeld, RDCS  

## 2012-06-01 DIAGNOSIS — I340 Nonrheumatic mitral (valve) insufficiency: Secondary | ICD-10-CM

## 2012-06-01 HISTORY — DX: Nonrheumatic mitral (valve) insufficiency: I34.0

## 2012-07-01 ENCOUNTER — Encounter: Payer: Self-pay | Admitting: Cardiology

## 2012-07-12 ENCOUNTER — Other Ambulatory Visit: Payer: Self-pay | Admitting: Nephrology

## 2012-07-15 ENCOUNTER — Ambulatory Visit
Admission: RE | Admit: 2012-07-15 | Discharge: 2012-07-15 | Disposition: A | Discharge status: Home / Self Care | Payer: Medicare Other | Source: Ambulatory Visit | Attending: Nephrology | Admitting: Nephrology

## 2012-08-30 ENCOUNTER — Ambulatory Visit (HOSPITAL_COMMUNITY)
Admission: RE | Admit: 2012-08-30 | Discharge: 2012-08-30 | Disposition: A | Discharge status: Home / Self Care | Payer: Medicare Other | Source: Ambulatory Visit | Attending: Internal Medicine | Admitting: Internal Medicine

## 2012-08-30 DIAGNOSIS — R0609 Other forms of dyspnea: Secondary | ICD-10-CM | POA: Insufficient documentation

## 2012-08-30 DIAGNOSIS — Z951 Presence of aortocoronary bypass graft: Secondary | ICD-10-CM

## 2012-08-30 DIAGNOSIS — I2510 Atherosclerotic heart disease of native coronary artery without angina pectoris: Secondary | ICD-10-CM | POA: Insufficient documentation

## 2012-08-30 DIAGNOSIS — R002 Palpitations: Secondary | ICD-10-CM | POA: Insufficient documentation

## 2012-08-30 DIAGNOSIS — R0989 Other specified symptoms and signs involving the circulatory and respiratory systems: Secondary | ICD-10-CM | POA: Insufficient documentation

## 2012-08-30 DIAGNOSIS — Z09 Encounter for follow-up examination after completed treatment for conditions other than malignant neoplasm: Secondary | ICD-10-CM | POA: Insufficient documentation

## 2012-08-30 MED ORDER — TECHNETIUM TC 99M SESTAMIBI GENERIC - CARDIOLITE
10.7000 | Freq: Once | INTRAVENOUS | Status: AC | PRN
  Administered 2012-08-30: 11 via INTRAVENOUS

## 2012-08-30 MED ORDER — TECHNETIUM TC 99M SESTAMIBI GENERIC - CARDIOLITE
30.1000 | Freq: Once | INTRAVENOUS | Status: AC | PRN
  Administered 2012-08-30: 30.1 via INTRAVENOUS

## 2012-08-30 NOTE — Procedures (Addendum)
[facility] [address] [place] [address] [place] [address] [phone]  Cardiology Nuclear Med Study  Willie [name] is a 72 y.o. male     MRN : [id number]     DOB: [birth year]  Procedure Date: 08/30/2012  Nuclear Med Background Indication for Stress Test:  Graft Patency History:  CAD;CABG X5--2007;PAF;DYASTOLIC DYSFUNCTION Cardiac Risk Factors: Family History - CAD, History of Smoking, Hypertension, Lipids and RBBB  Symptoms:  DOE and Palpitations   Nuclear Pre-Procedure Caffeine/Decaff Intake:  8:00pm NPO After: 6:00am   IV Site: R Antecubital  IV 0.9% NS with Angio Cath:  22g  Chest Size (in):  42" IV Started by: Emmit Pomfret, RN  Height: 5\' 10"  (1.778 m)  Cup Size: n/a  BMI:  Body mass index is 20.95 kg/(m^2). Weight:  146 lb (66.225 kg)   Tech Comments:  N/A     Nuclear Med Study 1 or 2 day study: 1 day  Stress Test Type:  Stress  Order Authorizing Provider:  JONATHAN BERRY,MD   Resting Radionuclide: Technetium 71m Sestamibi  Resting Radionuclide Dose: 10.7 mCi   Stress Radionuclide:  Technetium 92m Sestamibi  Stress Radionuclide Dose: 30.1 mCi           Stress Protocol Rest HR: 123 Stress HR: 151  Rest BP: 142/98 Stress BP: 165/88  Exercise Time (min): 6:00 METS: 7.0   Predicted Max HR: 147 bpm % Max HR: 102.72 bpm Rate Pressure Product: 21308  Dose of Adenosine (mg):  n/a Dose of Lexiscan: n/a mg  Dose of Atropine (mg): n/a Dose of Dobutamine: n/a mcg/kg/min (at max HR)  Stress Test Technologist: Esperanza Sheets, CCT Nuclear Technologist: Gonzella Lex, CNMT   Rest Procedure:  Myocardial perfusion imaging was performed at rest 45 minutes following the intravenous administration of Technetium 14m Sestamibi. Stress Procedure:  The patient performed treadmill exercise using a Bruce  Protocol for 6:00 minutes. The patient stopped due to SOB and Fatigue and denied any chest pain.  There were no significant ST-T  wave changes.  Technetium 58m Sestamibi was injected at peak exercise and myocardial perfusion imaging was performed after a brief delay.  Transient Ischemic Dilatation (Normal <1.22):  1.06 Lung/Heart Ratio (Normal <0.45):  0.24 QGS EDV:  91 ml QGS ESV:  45 ml LV Ejection Fraction: 51%  Signed by Gonzella Lex, CNMT  Rest ECG: NSR-RBBB  Stress ECG: No significant change from baseline ECG and There are scattered PVCs.  QPS Raw Data Images:  Acquisition technically good; normal left ventricular size. Stress Images:  There is decreased uptake in the inferior wall. There is a medium sized (11%), mild to moderate intensity perfusion defect in the mid to apical inferior wall with moderate reversibility. Rest Images:  There is decreased uptake in the inferior wall. 6% Subtraction (SDS):  There is a fixed defect that is most consistent with a previous infarction. There are findings also consistent with moderate ischemia. Suggestion of infarction with moderate peri-infarct ischemia.  Impression Exercise Capacity:  Fair exercise capacity. 6 minutes BP Response:  Normal blood pressure response. Clinical Symptoms:  There is dyspnea. Fatigue ECG Impression:  No significant ST segment change suggestive of ischemia. LV Wall Motion:  Low normal EF with mild hypokinesis and abnormal thickening in the mid to apical inferior wall.  Comparison with Prior Nuclear Study: The inferior perfusion defect both at rest and with stress is new from last study in 2012.  Overall Impression:  Abnormal Myocardial Perfusion Imaging test  with new findings suggesting an inferior infarction with mild to moderate peri-infarct ischemia.    Intermediate risk stress nuclear study with new iscehmia noted.  Clinical correlation is warranted.   [name], MD  08/30/2012 12:05 PM

## 2012-09-06 ENCOUNTER — Encounter: Payer: Self-pay | Admitting: Emergency Medicine

## 2012-09-08 ENCOUNTER — Ambulatory Visit (INDEPENDENT_AMBULATORY_CARE_PROVIDER_SITE_OTHER): Payer: Medicare Other | Admitting: Cardiovascular Disease

## 2012-09-08 ENCOUNTER — Encounter: Payer: Self-pay | Admitting: Cardiovascular Disease

## 2012-09-08 VITALS — BP 138/76 | HR 84 | Ht 70.0 in | Wt 144.0 lb

## 2012-09-08 DIAGNOSIS — I2510 Atherosclerotic heart disease of native coronary artery without angina pectoris: Secondary | ICD-10-CM

## 2012-09-08 DIAGNOSIS — I340 Nonrheumatic mitral (valve) insufficiency: Secondary | ICD-10-CM | POA: Insufficient documentation

## 2012-09-08 DIAGNOSIS — I059 Rheumatic mitral valve disease, unspecified: Secondary | ICD-10-CM

## 2012-09-08 DIAGNOSIS — Z86718 Personal history of other venous thrombosis and embolism: Secondary | ICD-10-CM

## 2012-09-08 NOTE — Assessment & Plan Note (Signed)
Document by recent 2-D echo possibly ischemic in etiology. The patient currently has normal LV size and function.

## 2012-09-08 NOTE — Patient Instructions (Signed)
Your physician has requested that you have a cardiac catheterization. Cardiac catheterization is used to diagnose and/or treat various heart conditions. Doctors may recommend this procedure for a number of different reasons. The most common reason is to evaluate chest pain. Chest pain can be a symptom of coronary artery disease (CAD), and cardiac catheterization can show whether plaque is narrowing or blocking your heart's arteries. This procedure is also used to evaluate the valves, as well as measure the blood flow and oxygen levels in different parts of your heart. For further information please visit [email or web address]   Following your catheterization, you will not be allowed to drive for 3 days.  No lifting, pushing, or pulling greater that 10 pounds is allowed for 1 week.  You will receive a phone call from [facility] the day before the procedure.  When they call you, please report to the hospital so that you can begin receiving IV fluids to prepare your kidneys for the upcoming catheterization.  If you have not received a phone call from the hospital by 2pm, please call our office and let us know.

## 2012-09-08 NOTE — Progress Notes (Signed)
09/08/2012 Willie [name]   05/21/1940  [id number]  Primary Physician Willie Moh, MD Primary Cardiologist: Willie Gess MD Willie [name]   HPI:  The patient is a pleasant, 72 year old male followed by Dr. Allyson [name] with a history of bypass grafting x5 on March 29, 2006. He had a LIMA to the LAD, an SVG to the diagonal, a sequential vein graft to the first and second obtuse marginal, and a vein graft to the posterolateral artery. The surgeon was Dr. Laneta Simmers. He tolerated this well. He did have some postoperative paroxysmal atrial fibrillation. That has not recurred. He had a Myoview in 2012 that was low risk. Recently, he saw Dr. Allyson [name] because his primary care doctor picked up a murmur. An echo was done May 31, 2012. That revealed normal LV function with an EF of 55 to 60%. There were no wall motion abnormalities. He did have grade 2 diastolic dysfunction. He had an elevated left atrial filling pressure. He had thickened mitral leaflets with systolic bowing without prolapse. There was moderate to severe regurgitation directed eccentrically toward the free wall. RV systolic pressures were also slightly increased with PA pressures of 33 mm. The patient is here now for followup from these test results. Symptomatically, he is doing well. He denies any orthopnea, PND, or unusual dyspnea on exertion. He denies any lower extremity edema. He has not had anginal symptoms. He has been taking some Mucinex D for a mild upper respiratory condition. His recent Myoview stress test showed inferior scar with moderate peri-infarct ischemia which is a new finding. His prior Myoview performed 08/22/2010.     Current Outpatient Prescriptions  Medication Sig Dispense Refill  . aspirin 81 MG tablet Take 81 mg by mouth daily.      . calcium carbonate (OS-CAL) 600 MG TABS Take 600 mg by mouth daily.       . cholecalciferol (VITAMIN D) 1000 UNITS tablet Take 1,000 Units by mouth daily.       .  fesoterodine (TOVIAZ) 4 MG TB24 Take 4 mg by mouth every other day.       . fish oil-omega-3 fatty acids 1000 MG capsule Take 500 mg by mouth daily.      . metoprolol tartrate (LOPRESSOR) 25 MG tablet Take 25 mg by mouth daily.       . Multiple Vitamin (MULTIVITAMIN) tablet Take 1 tablet by mouth daily.      . rosuvastatin (CRESTOR) 10 MG tablet Take 10 mg by mouth 2 (two) times a week.      . testosterone cypionate (DEPOTESTOTERONE CYPIONATE) 100 MG/ML injection Inject 200 mg into the muscle every 14 (fourteen) days. For IM use only      . vitamin B-12 (CYANOCOBALAMIN) 500 MCG tablet Take 500 mcg by mouth daily.      . ferrous sulfate (QC FERROUS SULFATE) 325 (65 FE) MG tablet Take 1 tablet (325 mg total) by mouth daily with breakfast.  30 tablet  11   No current facility-administered medications for this visit.    Allergies  Allergen Reactions  . Niaspan (Niacin Er)     History   Social History  . Marital Status: Married    Spouse Name: N/A    Number of Children: N/A  . Years of Education: N/A   Occupational History  . Not on file.   Social History Main Topics  . Smoking status: Former Smoker    Quit date: 07/01/1969  . Smokeless tobacco: Not on file  . Alcohol Use:  No  . Drug Use: No  . Sexually Active: Not on file   Other Topics Concern  . Not on file   Social History Narrative  . No narrative on file     Review of Systems: General: negative for chills, fever, night sweats or weight changes.  Cardiovascular: negative for chest pain, dyspnea on exertion, edema, orthopnea, palpitations, paroxysmal nocturnal dyspnea or shortness of breath Dermatological: negative for rash Respiratory: negative for cough or wheezing Urologic: negative for hematuria Abdominal: negative for nausea, vomiting, diarrhea, bright red blood per rectum, melena, or hematemesis Neurologic: negative for visual changes, syncope, or dizziness All other systems reviewed and are otherwise negative  except as noted above.    Blood pressure 138/76, pulse 84, height 5\' 10"  (1.778 m), weight 144 lb (65.318 kg).  General appearance: alert and no distress Neck: no adenopathy, no JVD, supple, symmetrical, trachea midline, thyroid not enlarged, symmetric, no tenderness/mass/nodules and bilateral bruits Lungs: clear to auscultation bilaterally Heart: regular rate and rhythm, S1, S2 normal, no murmur, click, rub or gallop and there was a loud holosystolic murmur heart best at the apex Extremities: extremities normal, atraumatic, no cyanosis or edema  EKG not performed today  ASSESSMENT AND PLAN:   History of DVT of lower extremity He is to status post coronary bypass grafting x5 03/29/2006. He'll leave it to his LAD, saphenous vein to a diagonal branch, sequential vein to the first and second obtuse marginal branches, and a vein to the posterolateral branch by Dr. [name]. The Myoview performed in 2012 that was low risk and nonischemic. He had an echo performed recently that showed severe mitral regurgitation Prior echo. He is completely asymptomatic.I'm concerned that his new MR ischemically mediated related to a probable with 1 of his grafts.based on this I decided to proceed with diagnostic coronary arteriography. Given his mild to moderate renal insufficiency he will be admitted and Metapore for hydration.  Severe mitral regurgitation Document by recent 2-D echo possibly ischemic in etiology. The patient currently has normal LV size and function.       Willie Gess MD FACP,FACC,FAHA, [facility] 09/08/2012 4:27 PM

## 2012-09-08 NOTE — Assessment & Plan Note (Signed)
He is to status post coronary bypass grafting x5 03/29/2006. He'll leave it to his LAD, saphenous vein to a diagonal branch, sequential vein to the first and second obtuse marginal branches, and a vein to the posterolateral branch by Dr. [name]. The Myoview performed in 2012 that was low risk and nonischemic. He had an echo performed recently that showed severe mitral regurgitation Prior echo. He is completely asymptomatic.I'm concerned that his new MR ischemically mediated related to a probable with 1 of his grafts.based on this I decided to proceed with diagnostic coronary arteriography. Given his mild to moderate renal insufficiency he will be admitted and Metapore for hydration.

## 2012-09-09 ENCOUNTER — Encounter (HOSPITAL_COMMUNITY): Payer: Self-pay | Admitting: Pharmacy Technician

## 2012-10-02 ENCOUNTER — Encounter (HOSPITAL_COMMUNITY): Payer: Self-pay | Admitting: Cardiology

## 2012-10-02 ENCOUNTER — Observation Stay (HOSPITAL_COMMUNITY)
Admission: AD | Admit: 2012-10-02 | Discharge: 2012-10-04 | Disposition: A | Discharge status: Home / Self Care | Payer: Medicare Other | Source: Ambulatory Visit | Attending: Cardiovascular Disease | Admitting: Cardiovascular Disease

## 2012-10-02 DIAGNOSIS — N183 Chronic kidney disease, stage 3 unspecified: Secondary | ICD-10-CM

## 2012-10-02 DIAGNOSIS — Z79899 Other long term (current) drug therapy: Secondary | ICD-10-CM | POA: Insufficient documentation

## 2012-10-02 DIAGNOSIS — R931 Abnormal findings on diagnostic imaging of heart and coronary circulation: Secondary | ICD-10-CM | POA: Diagnosis present

## 2012-10-02 DIAGNOSIS — I129 Hypertensive chronic kidney disease with stage 1 through stage 4 chronic kidney disease, or unspecified chronic kidney disease: Secondary | ICD-10-CM | POA: Insufficient documentation

## 2012-10-02 DIAGNOSIS — I059 Rheumatic mitral valve disease, unspecified: Secondary | ICD-10-CM | POA: Insufficient documentation

## 2012-10-02 DIAGNOSIS — I2510 Atherosclerotic heart disease of native coronary artery without angina pectoris: Secondary | ICD-10-CM | POA: Insufficient documentation

## 2012-10-02 DIAGNOSIS — R9439 Abnormal result of other cardiovascular function study: Secondary | ICD-10-CM | POA: Insufficient documentation

## 2012-10-02 DIAGNOSIS — I25810 Atherosclerosis of coronary artery bypass graft(s) without angina pectoris: Principal | ICD-10-CM | POA: Insufficient documentation

## 2012-10-02 DIAGNOSIS — I340 Nonrheumatic mitral (valve) insufficiency: Secondary | ICD-10-CM | POA: Diagnosis present

## 2012-10-02 DIAGNOSIS — R9430 Abnormal result of cardiovascular function study, unspecified: Secondary | ICD-10-CM

## 2012-10-02 DIAGNOSIS — I4510 Unspecified right bundle-branch block: Secondary | ICD-10-CM | POA: Diagnosis present

## 2012-10-02 DIAGNOSIS — N2889 Other specified disorders of kidney and ureter: Secondary | ICD-10-CM | POA: Diagnosis present

## 2012-10-02 DIAGNOSIS — I10 Essential (primary) hypertension: Secondary | ICD-10-CM | POA: Diagnosis present

## 2012-10-02 DIAGNOSIS — Z951 Presence of aortocoronary bypass graft: Secondary | ICD-10-CM

## 2012-10-02 LAB — CBC
HCT: 33.5 % — ABNORMAL LOW (ref 39.0–52.0)
Hemoglobin: 11.4 g/dL — ABNORMAL LOW (ref 13.0–17.0)
MCH: 32.7 pg (ref 26.0–34.0)
MCHC: 34 g/dL (ref 30.0–36.0)
MCV: 96 fL (ref 78.0–100.0)
Platelets: 169 10*3/uL (ref 150–400)
RBC: 3.49 MIL/uL — ABNORMAL LOW (ref 4.22–5.81)
RDW: 13.6 % (ref 11.5–15.5)
WBC: 7.7 10*3/uL (ref 4.0–10.5)

## 2012-10-02 LAB — BASIC METABOLIC PANEL
BUN: 19 mg/dL (ref 6–23)
CO2: 25 mEq/L (ref 19–32)
Calcium: 9.2 mg/dL (ref 8.4–10.5)
Chloride: 105 mEq/L (ref 96–112)
Creatinine, Ser: 1.54 mg/dL — ABNORMAL HIGH (ref 0.50–1.35)
GFR calc Af Amer: 51 mL/min — ABNORMAL LOW (ref >90)
GFR calc non Af Amer: 44 mL/min — ABNORMAL LOW (ref >90)
Glucose, Bld: 94 mg/dL (ref 70–99)
Potassium: 4 mEq/L (ref 3.5–5.1)
Sodium: 137 mEq/L (ref 135–145)

## 2012-10-02 LAB — PROTIME-INR
INR: 0.89 (ref 0.00–1.49)
Prothrombin Time: 12 seconds (ref 11.6–15.2)

## 2012-10-02 MED ORDER — ALPRAZOLAM 0.25 MG PO TABS: 0.2500 mg | ORAL_TABLET | Freq: Three times a day (TID) | ORAL | Status: DC | PRN

## 2012-10-02 MED ORDER — ROSUVASTATIN CALCIUM 10 MG PO TABS
10.0000 mg | ORAL_TABLET | ORAL | Status: DC
  Administered 2012-10-02: 10 mg via ORAL
  Filled 2012-10-02: qty 1

## 2012-10-02 MED ORDER — OMEGA-3-ACID ETHYL ESTERS 1 G PO CAPS
1.0000 g | ORAL_CAPSULE | Freq: Every day | ORAL | Status: DC
  Administered 2012-10-03 – 2012-10-04 (×2): 1 g via ORAL
  Filled 2012-10-02 (×2): qty 1

## 2012-10-02 MED ORDER — SODIUM CHLORIDE 0.9 % IJ SOLN: 3.0000 mL | Freq: Two times a day (BID) | INTRAMUSCULAR | Status: DC

## 2012-10-02 MED ORDER — ACETAMINOPHEN 325 MG PO TABS: 650.0000 mg | ORAL_TABLET | ORAL | Status: DC | PRN

## 2012-10-02 MED ORDER — ATORVASTATIN CALCIUM 20 MG PO TABS: 20.0000 mg | ORAL_TABLET | Freq: Every day | ORAL | Status: DC

## 2012-10-02 MED ORDER — ONDANSETRON HCL 4 MG/2ML IJ SOLN: 4.0000 mg | Freq: Four times a day (QID) | INTRAMUSCULAR | Status: DC | PRN

## 2012-10-02 MED ORDER — METOPROLOL TARTRATE 25 MG PO TABS
25.0000 mg | ORAL_TABLET | Freq: Every day | ORAL | Status: DC
  Administered 2012-10-03 – 2012-10-04 (×2): 25 mg via ORAL
  Filled 2012-10-02 (×2): qty 1

## 2012-10-02 MED ORDER — NITROGLYCERIN 0.4 MG SL SUBL: 0.4000 mg | SUBLINGUAL_TABLET | SUBLINGUAL | Status: DC | PRN

## 2012-10-02 MED ORDER — ADULT MULTIVITAMIN W/MINERALS CH
1.0000 | ORAL_TABLET | Freq: Every day | ORAL | Status: DC
  Filled 2012-10-02: qty 1

## 2012-10-02 MED ORDER — DIAZEPAM 5 MG PO TABS
5.0000 mg | ORAL_TABLET | ORAL | Status: AC
  Administered 2012-10-03: 5 mg via ORAL
  Filled 2012-10-02: qty 1

## 2012-10-02 MED ORDER — SODIUM CHLORIDE 0.9 % IV SOLN
INTRAVENOUS | Status: DC
  Administered 2012-10-03: 06:00:00 via INTRAVENOUS

## 2012-10-02 MED ORDER — FESOTERODINE FUMARATE ER 4 MG PO TB24
4.0000 mg | ORAL_TABLET | ORAL | Status: DC
  Filled 2012-10-02: qty 1

## 2012-10-02 MED ORDER — SODIUM CHLORIDE 0.9 % IV SOLN: 250.0000 mL | INTRAVENOUS | Status: DC | PRN

## 2012-10-02 MED ORDER — OMEGA-3 FATTY ACIDS 1000 MG PO CAPS: 1.0000 g | ORAL_CAPSULE | Freq: Every day | ORAL | Status: DC

## 2012-10-02 MED ORDER — ASPIRIN EC 81 MG PO TBEC
81.0000 mg | DELAYED_RELEASE_TABLET | Freq: Every evening | ORAL | Status: DC
  Filled 2012-10-02 (×3): qty 1

## 2012-10-02 MED ORDER — SODIUM CHLORIDE 0.9 % IJ SOLN: 3.0000 mL | INTRAMUSCULAR | Status: DC | PRN

## 2012-10-02 MED ORDER — ZOLPIDEM TARTRATE 5 MG PO TABS: 5.0000 mg | ORAL_TABLET | Freq: Every evening | ORAL | Status: DC | PRN

## 2012-10-02 NOTE — H&P (Signed)
Patient ID: Willie [name] MRN: [id number], DOB/AGE: 26-Jul-1940   Admit date: 10/02/2012   Primary Physician: Londell Moh, MD Primary Cardiologist: Dr Allyson Sabal  HPI: See Office note from 09/08/12  The patient is a pleasant, 72 year old male followed by Dr. Allyson Sabal with a history of bypass grafting x5 on March 29, 2006. He had a LIMA to the LAD, an SVG to the diagonal, a sequential vein graft to the first and second obtuse marginal, and a vein graft to the posterolateral artery. The surgeon was Dr. Laneta Simmers. He tolerated this well. He did have some postoperative paroxysmal atrial fibrillation. That has not recurred. He had a Myoview in 2012 that was low risk. Recently, he saw Dr. Allyson Sabal because his primary care doctor picked up a murmur. An echo was done May 31, 2012. That revealed normal LV function with an EF of 55 to 60%. There were no wall motion abnormalities. He did have grade 2 diastolic dysfunction. He had an elevated left atrial filling pressure. He had thickened mitral leaflets with systolic bowing without prolapse. There was moderate to severe regurgitation directed eccentrically toward the free wall. RV systolic pressures were also slightly increased with PA pressures of 33 mm. Symptomatically, he is doing well. He denies any orthopnea, PND, or unusual dyspnea on exertion. He denies any lower extremity edema. He has not had anginal symptoms. His recent Myoview stress test showed inferior scar with moderate peri-infarct ischemia which is a new finding compared to his prior Myoview performed 08/22/2010. Dr Allyson Sabal saw him in the office 09/08/12. He was concerned that his new MR was ischemically mediated and possibly related to 1 of his grafts. .Based on this Dr Allyson Sabal decided to proceed with diagnostic coronary arteriography. Given his mild to moderate renal insufficiency he is to be admitted the night before his cath for hydration.      Problem List: Past Medical History  Diagnosis  Date  . Cancer   . Hypertension   . 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 Low serum testosterone level 11/10/2011  . 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 Nocturia 11/10/2011  . Normochromic anemia 11/10/2011    Likely result of prior high dose chemotherapy  . Unexplained weight loss 05/03/2012  . Mitral valvular regurgitation 06/01/12    moderate to severe by echo- asymptomatic  . Dyslipidemia   . RBBB   . Chronic renal insufficiency, stage III (moderate)     Past Surgical History  Procedure Laterality Date  . Coronary artery bypass graft  03/29/2006  . Limbal stem cell transplant  2010     Allergies:  Allergies  Allergen Reactions  . Niaspan (Niacin Er)      Home Medications Current Facility-Administered Medications  Medication Dose Route Frequency Provider Last Rate Last Dose  . 0.9 %  sodium chloride infusion  250 mL Intravenous PRN Eda Paschal Sharmila Wrobleski, PA-C      . 0.9 %  sodium chloride infusion   Intravenous Continuous Yariah Selvey K Earlisha Sharples, PA-C      . acetaminophen (TYLENOL) tablet 650 mg  650 mg Oral Q4H PRN Abelino Derrick, PA-C      . ALPRAZolam Prudy Feeler) tablet 0.25 mg  0.25 mg Oral TID PRN Abelino Derrick, PA-C      . aspirin EC tablet 81 mg  81 mg Oral QPM Abelino Derrick,  PA-C      . atorvastatin (LIPITOR) tablet 20 mg  20 mg Oral q1800 Eda Paschal [place], [place]      . [START ON 10/03/2012] diazepam (VALIUM) tablet 5 mg  5 mg Oral On Call Ghada Abbett K Brenley Priore, PA-C      . fesoterodine (TOVIAZ) tablet 4 mg  4 mg Oral QODAY Trissa Molina K Brelee Renk, PA-C      . fish oil-omega-3 fatty acids capsule 1 g  1 g Oral Daily Zachry Hopfensperger K Jenyfer Trawick, PA-C      . metoprolol tartrate (LOPRESSOR) tablet 25 mg  25 mg Oral Daily Diann Bangerter K Hilliard Borges, PA-C      . multivitamin with minerals tablet 1 tablet  1 tablet Oral Daily Adyan Palau K Daltin Crist, PA-C      . ondansetron [facility]) injection 4 mg  4 mg Intravenous Q6H PRN Eda Paschal Melanie Openshaw, PA-C      . sodium chloride 0.9 % injection 3 mL  3 mL Intravenous Q12H Airanna Partin K Olan Kurek, PA-C      . sodium chloride 0.9 % injection 3 mL  3 mL Intravenous PRN [place] Hamada K Kyston Gonce, PA-C      . zolpidem (AMBIEN) tablet 5 mg  5 mg Oral QHS PRN Abelino Derrick, PA-C         Family History  Problem Relation Age of Onset  . Cancer - Lung Father 67  . Leukemia Brother 21     History   Social History  . Marital Status: Married    Spouse Name: N/A    Number of Children: N/A  . Years of Education: N/A   Occupational History  . Not on file.   Social History Main Topics  . Smoking status: Former Smoker    Quit date: 07/01/1969  . Smokeless tobacco: Not on file  . Alcohol Use: No  . Drug Use: No  . Sexually Active: Not on file   Other Topics Concern  . Not on file   Social History Narrative  . No narrative on file     Review of Systems: General: negative for chills, fever, night sweats or weight changes.  Cardiovascular: negative for chest pain, dyspnea on exertion, edema, orthopnea, palpitations, paroxysmal nocturnal dyspnea or shortness of breath Dermatological: negative for rash Respiratory: negative for cough or wheezing Urologic: negative for hematuria Abdominal: negative for nausea, vomiting, diarrhea, bright red blood per rectum, melena, or hematemesis Neurologic: negative for visual changes, syncope, or dizziness All other systems reviewed and are otherwise negative except as noted above.  Physical Exam: Blood pressure 121/74, pulse 80, temperature 98 F (36.7 C), temperature source Oral, resp. rate 18, height 5\' 10"  (1.778 m), weight 63.05 kg (139 lb), SpO2 99.00%.  See office note from 09/08/12 for PE    Labs:  No results found for this or any previous visit (from the past 24 hour(s)).   Radiology/Studies: No results found.   ASSESSMENT AND PLAN:  Principal Problem:   Abnormal nuclear cardiac imaging test Active Problems:   Severe mitral regurgitation    Chronic renal insufficiency, stage III (moderate)   S/P CABG x 5  12/07   RBBB   Hypertension   PLAN: Admit, hydrate, cath in am pending labs.   Deland Pretty, PA-C 10/02/2012, 4:08 PM

## 2012-10-03 ENCOUNTER — Ambulatory Visit (HOSPITAL_COMMUNITY): Admission: RE | Admit: 2012-10-03 | Payer: Medicare Other | Source: Ambulatory Visit | Admitting: Cardiovascular Disease

## 2012-10-03 ENCOUNTER — Encounter (HOSPITAL_COMMUNITY)
Admission: AD | Disposition: A | Discharge status: Home / Self Care | Payer: Self-pay | Source: Ambulatory Visit | Attending: Cardiovascular Disease

## 2012-10-03 DIAGNOSIS — R9439 Abnormal result of other cardiovascular function study: Secondary | ICD-10-CM

## 2012-10-03 DIAGNOSIS — I2510 Atherosclerotic heart disease of native coronary artery without angina pectoris: Secondary | ICD-10-CM

## 2012-10-03 HISTORY — PX: LEFT HEART CATHETERIZATION WITH CORONARY/GRAFT ANGIOGRAM: SHX5450

## 2012-10-03 LAB — COMPREHENSIVE METABOLIC PANEL
ALT: 21 U/L (ref 0–53)
AST: 21 U/L (ref 0–37)
Albumin: 3.1 g/dL — ABNORMAL LOW (ref 3.5–5.2)
Alkaline Phosphatase: 82 U/L (ref 39–117)
BUN: 14 mg/dL (ref 6–23)
CO2: 25 mEq/L (ref 19–32)
Calcium: 8.4 mg/dL (ref 8.4–10.5)
Chloride: 108 mEq/L (ref 96–112)
Creatinine, Ser: 1.59 mg/dL — ABNORMAL HIGH (ref 0.50–1.35)
GFR calc Af Amer: 49 mL/min — ABNORMAL LOW (ref >90)
GFR calc non Af Amer: 42 mL/min — ABNORMAL LOW (ref >90)
Glucose, Bld: 93 mg/dL (ref 70–99)
Potassium: 3.8 mEq/L (ref 3.5–5.1)
Sodium: 141 mEq/L (ref 135–145)
Total Bilirubin: 0.3 mg/dL (ref 0.3–1.2)
Total Protein: 5.5 g/dL — ABNORMAL LOW (ref 6.0–8.3)

## 2012-10-03 SURGERY — LEFT HEART CATHETERIZATION WITH CORONARY/GRAFT ANGIOGRAM
Anesthesia: LOCAL

## 2012-10-03 MED ORDER — LIDOCAINE HCL (PF) 1 % IJ SOLN
INTRAMUSCULAR | Status: AC
  Filled 2012-10-03: qty 30

## 2012-10-03 MED ORDER — SODIUM CHLORIDE 0.9 % IV SOLN
INTRAVENOUS | Status: AC
  Administered 2012-10-03: 20:00:00 via INTRAVENOUS

## 2012-10-03 MED ORDER — ONDANSETRON HCL 4 MG/2ML IJ SOLN: 4.0000 mg | Freq: Four times a day (QID) | INTRAMUSCULAR | Status: DC | PRN

## 2012-10-03 MED ORDER — ACETAMINOPHEN 325 MG PO TABS: 650.0000 mg | ORAL_TABLET | ORAL | Status: DC | PRN

## 2012-10-03 MED ORDER — HEPARIN (PORCINE) IN NACL 2-0.9 UNIT/ML-% IJ SOLN
INTRAMUSCULAR | Status: AC
  Filled 2012-10-03: qty 1000

## 2012-10-03 MED ORDER — ASPIRIN 81 MG PO CHEW
324.0000 mg | CHEWABLE_TABLET | Freq: Once | ORAL | Status: AC
  Administered 2012-10-03: 324 mg via ORAL
  Filled 2012-10-03: qty 4

## 2012-10-03 NOTE — Progress Notes (Addendum)
[facility]  Subjective: No chest pain or SOB   Objective: Vital signs in last 24 hours: Temp:  [97.5 F (36.4 C)-98.7 F (37.1 C)] 97.5 F (36.4 C) (06/16 0414) Pulse Rate:  [77-90] 90 (06/16 0414) Resp:  [18-19] 18 (06/16 0414) BP: (121-147)/(73-75) 147/75 mmHg (06/16 0414) SpO2:  [98 %-100 %] 98 % (06/16 0414) Weight:  [139 lb (63.05 kg)] 139 lb (63.05 kg) (06/15 1602) Last BM Date: 10/02/12  Intake/Output from previous day: 06/15 0701 - 06/16 0700 In: 900 [I.V.:900] Out: -  Intake/Output this shift:    Medications Current Facility-Administered Medications  Medication Dose Route Frequency Provider Last Rate Last Dose  . 0.9 %  sodium chloride infusion  250 mL Intravenous PRN Eda Paschal Kilroy, PA-C      . 0.9 %  sodium chloride infusion   Intravenous Continuous Abelino Derrick, PA-C 75 mL/hr at 10/03/12 0553    . acetaminophen (TYLENOL) tablet 650 mg  650 mg Oral Q4H PRN Abelino Derrick, PA-C      . ALPRAZolam Prudy Feeler) tablet 0.25 mg  0.25 mg Oral TID PRN Abelino Derrick, PA-C      . aspirin EC tablet 81 mg  81 mg Oral QPM Luke K Kilroy, PA-C      . diazepam (VALIUM) tablet 5 mg  5 mg Oral On Call Luke K Kilroy, PA-C      . fesoterodine (TOVIAZ) tablet 4 mg  4 mg Oral QODAY Luke K Kilroy, PA-C      . metoprolol tartrate (LOPRESSOR) tablet 25 mg  25 mg Oral Daily Luke K Kilroy, PA-C      . multivitamin with minerals tablet 1 tablet  1 tablet Oral Daily Luke K Kilroy, PA-C      . nitroGLYCERIN (NITROSTAT) SL tablet 0.4 mg  0.4 mg Sublingual Q5 min PRN Abelino Derrick, PA-C      . omega-3 acid ethyl esters (LOVAZA) capsule 1 g  1 g Oral Daily Runell Gess, MD      . ondansetron [facility]) injection 4 mg  4 mg Intravenous Q6H PRN Eda Paschal Kilroy, PA-C      . rosuvastatin (CRESTOR) tablet 10 mg  10 mg Oral Custom Runell Gess, MD   10 mg at 10/02/12 1808  . sodium chloride 0.9 % injection 3 mL  3 mL Intravenous Q12H Luke K Kilroy, PA-C      . sodium  chloride 0.9 % injection 3 mL  3 mL Intravenous PRN Luke K Kilroy, PA-C      . zolpidem (AMBIEN) tablet 5 mg  5 mg Oral QHS PRN Abelino Derrick, PA-C        PE: General appearance: alert, cooperative and no distress Lungs: clear to auscultation bilaterally Heart: regular rate and rhythm and 4/6 blowing MR murmur best heard at the apex Extremities: no LEE Pulses: 2+ and symmetric Skin: warm and dry Neurologic: Grossly normal  Lab Results:   Recent Labs  10/02/12 1621  WBC 7.7  HGB 11.4*  HCT 33.5*  PLT 169   BMET  Recent Labs  10/02/12 1621 10/03/12 0524  NA 137 141  K 4.0 3.8  CL 105 108  CO2 25 25  GLUCOSE 94 93  BUN 19 14  CREATININE 1.54* 1.59*  CALCIUM 9.2 8.4   PT/INR  Recent Labs  10/02/12 1621  LABPROT 12.0  INR 0.89    Assessment/Plan  Principal Problem:   Abnormal nuclear cardiac imaging  test Active Problems:   Chronic renal insufficiency, stage III (moderate)   S/P CABG x 5  12/07   Severe mitral regurgitation   RBBB   Hypertension  Plan: Plan for diagnostic cath to evaluate for ischemia, in the setting of new MR. There has been little change in renal function with pre-cath hydration. Will need to use limited contrast dye.     LOS: 1 day    Willie [name] 10/03/2012 8:17 AM  Agree with note written by Boyce Medici  PAC  Pt well known to me with CAD and severe MR admitted for hydration yesterday prior to cath. SCr has actually increased since yesterday. Pt is asymptomatic. Plan Cors and grafts w/o LV to limit contrast.  Exam benign except for loud MR murmur.   Runell Gess 10/03/2012 8:20 AM

## 2012-10-03 NOTE — H&P (Signed)
    Pt was reexamined and existing H & P reviewed. No changes found.  Runell Gess, MD [facility] 10/03/2012 10:27 AM

## 2012-10-03 NOTE — Progress Notes (Signed)
Patient received from cath lab. Assessed per flow sheet. Denies chest pain or shortness of breath. Right groin dressing dry/intact with no bleeding or hematoma present.  Post activity and precautions explained. Post precautions and activity explained. VSS. Call bell near. Mamie Levers

## 2012-10-03 NOTE — Progress Notes (Signed)
Right groin D/I with no bleeding or hematoma present. Assisted patient out of bed to ambulated in hallway without difficulty. Returned to bedside chair in room w/out incidence. Right groin unchanged. Call bell and family near. Precautions explained. Will monitor. Willie [name]

## 2012-10-03 NOTE — CV Procedure (Signed)
Willie [name] is a 72 y.o. male    [id number] LOCATION:  FACILITY: [facility]  PHYSICIAN: Nanetta Batty, M.D. 1940-10-04   DATE OF PROCEDURE:  10/03/2012  DATE OF DISCHARGE:   CARDIAC CATHETERIZATION     History obtained from chart review.The patient is a pleasant, 72 year old male followed by Dr. Allyson Sabal with a history of bypass grafting x5 on March 29, 2006. He had a LIMA to the LAD, an SVG to the diagonal, a sequential vein graft to the first and second obtuse marginal, and a vein graft to the posterolateral artery. The surgeon was Dr. Laneta Simmers. He tolerated this well. He did have some postoperative paroxysmal atrial fibrillation. That has not recurred. He had a Myoview in 2012 that was low risk. Recently, he saw Dr. Allyson Sabal because his primary care doctor picked up a murmur. An echo was done May 31, 2012. That revealed normal LV function with an EF of 55 to 60%. There were no wall motion abnormalities. He did have grade 2 diastolic dysfunction. He had an elevated left atrial filling pressure. He had thickened mitral leaflets with systolic bowing without prolapse. There was moderate to severe regurgitation directed eccentrically toward the free wall. RV systolic pressures were also slightly increased with PA pressures of 33 mm. Symptomatically, he is doing well. He denies any orthopnea, PND, or unusual dyspnea on exertion. He denies any lower extremity edema. He has not had anginal symptoms. His recent Myoview stress test showed inferior scar with moderate peri-infarct ischemia which is a new finding compared to his prior Myoview performed 08/22/2010. Dr Allyson Sabal saw him in the office 09/08/12. He was concerned that his new MR was ischemically mediated and possibly related to 1 of his grafts. .Based on this Dr Allyson Sabal decided to proceed with diagnostic coronary arteriography. Given his mild to moderate renal insufficiency he is to be admitted the night before his cath for hydration.    PROCEDURE  DESCRIPTION:    The patient was brought to the second floor [place] Cardiac cath lab in the postabsorptive state. He was premedicated with Valium 5 mg by mouth. His right groin was prepped and shaved in usual sterile fashion. Xylocaine 1% was used for local anesthesia. A 5 French sheath was inserted into the right common femoral  artery using standard Seldinger technique. 5 French right and left Judkins diagnostic catheters along with a 5 French pigtail catheter were used for selective coronary angiography, selective vein graft and IMA angiography and left ventriculography. Visipaque dye was used for the entirety of the case. Retrograde aortic, left ventricular and pullback pressures were recorded.  HEMODYNAMICS:    AO SYSTOLIC/AO DIASTOLIC: 133/67   LV SYSTOLIC/LV DIASTOLIC: 133/13  ANGIOGRAPHIC RESULTS:   1. Left main; 95% distal going into the left circumflex  2. LAD; occluded at the origin 3. Left circumflex; 95% ostial off of the left main. The ramus branch which filled by vein graft is occluded at its origin as well..  4. Right coronary artery; dominant with a 95% fairly focal stenosis in the midportion. The posterior lateral branch was effectively occluded. 5.LIMA TO LAD; widely patent 6. SVG TO diagonal branch 1 and 2 sequentially was widely patent. There was a 60% stenosis in the continuation of the vein graft between the diagonal branches one and 2.     SVG TO ramus branch had a 60 to percent hypodense mid shaft stenosis     SVG TO dominant right was occluded at its origin 7. Left ventriculography; RAO left  ventriculogram was performed using  25 mL of Visipaque dye at 12 mL/second. The overall LVEF estimated  50-55 %  With wall motion abnormalities notable for very mild inferoposterolateral hypokinesia. There was 2-3+ angiographic mitral regurgitation noted.  IMPRESSION:Mr. Dandridge has significant graft disease. He has a protected left main off of the circumflex coronary artery  was 95% and an occluded RCA vein graft supplying the dominant right with a high-grade mid stenosis. It certainly is possible that his  severe mitral regurgitation is ischemically mediated especially in light of the fact that the MR was only mild by 2-D echo 2 years ago. Despite the fact that he is asymptomatic I suspect he should have percutaneous revascularization of his left main into his circumflex as well as his mid dominant RCA in order to improve his severe mitral regurgitation. Because of his moderate renal deficiency and the fact 100 cc of contrast was administered, intervention will be delayed and staged. The patient will be hydrated overnight and discharged home. He'll be set up for planned intervention.  Runell Gess MD, [facility] 10/03/2012 11:12 AM

## 2012-10-04 LAB — BASIC METABOLIC PANEL
BUN: 13 mg/dL (ref 6–23)
CO2: 26 mEq/L (ref 19–32)
Chloride: 107 mEq/L (ref 96–112)
GFR calc non Af Amer: 44 mL/min — ABNORMAL LOW (ref >90)
Glucose, Bld: 91 mg/dL (ref 70–99)
Potassium: 3.8 mEq/L (ref 3.5–5.1)
Sodium: 138 mEq/L (ref 135–145)

## 2012-10-04 MED ORDER — PRASUGREL HCL 10 MG PO TABS: 10.0000 mg | ORAL_TABLET | Freq: Every day | ORAL | Status: DC

## 2012-10-04 MED ORDER — NITROGLYCERIN 0.4 MG SL SUBL: 0.4000 mg | SUBLINGUAL_TABLET | SUBLINGUAL | Status: AC | PRN

## 2012-10-04 NOTE — Care Management Note (Signed)
    Page 1 of 1   10/04/2012     2:52:09 PM   CARE MANAGEMENT NOTE 10/04/2012  Patient:  Willie [name], Willie [name]   Account Number:  [id number]  Date Initiated:  10/04/2012  Documentation initiated by:  Kayl Stogdill  Subjective/Objective Assessment:   PT ADM ON 10/02/12 FOR CARDIAC CATH.  PTA, PT INDEPENDENT, LIVES WITH SPOUSE.     Action/Plan:   FOR DC HOME TODAY.  NO DC NEEDS.   Anticipated DC Date:  10/04/2012   Anticipated DC Plan:  HOME/SELF CARE      DC Planning Services  CM consult      Choice offered to / List presented to:             Status of service:  Completed, signed off Medicare Important Message given?   (If response is "NO", the following Medicare IM given date fields will be blank) Date Medicare IM given:   Date Additional Medicare IM given:    Discharge Disposition:    Per UR Regulation:  Reviewed for med. necessity/level of care/duration of stay  If discussed at Long Length of Stay Meetings, dates discussed:    Comments:

## 2012-10-04 NOTE — Progress Notes (Signed)
Subjective:  No Cp/SOB  Objective:  Temp:  [97.6 F (36.4 C)-98.2 F (36.8 C)] 97.6 F (36.4 C) (06/17 0519) Pulse Rate:  [64-91] 64 (06/17 0519) Resp:  [16-18] 18 (06/17 0519) BP: (124-164)/(46-78) 132/70 mmHg (06/17 0519) SpO2:  [98 %-100 %] 98 % (06/17 0519) Weight change:   Intake/Output from previous day: 06/16 0701 - 06/17 0700 In: 240 [P.O.:240] Out: 2400 [Urine:2400]  Intake/Output from this shift:    Physical Exam: General appearance: alert and no distress Neck: no adenopathy, no carotid bruit, no JVD, supple, symmetrical, trachea midline and thyroid not enlarged, symmetric, no tenderness/mass/nodules Lungs: clear to auscultation bilaterally Heart: 3/6 apical SEM Extremities: extremities normal, atraumatic, no cyanosis or edema and Right groin puncture site OK  Lab Results: Results for orders placed during the hospital encounter of 10/02/12 (from the past 48 hour(s))  BASIC METABOLIC PANEL     Status: Abnormal   Collection Time    10/02/12  4:21 PM      Result Value Range   Sodium 137  135 - 145 mEq/L   Potassium 4.0  3.5 - 5.1 mEq/L   Chloride 105  96 - 112 mEq/L   CO2 25  19 - 32 mEq/L   Glucose, Bld 94  70 - 99 mg/dL   BUN 19  6 - 23 mg/dL   Creatinine, Ser 1.61 (*) 0.50 - 1.35 mg/dL   Calcium 9.2  8.4 - 09.6 mg/dL   GFR calc non Af Amer 44 (*) >90 mL/min   GFR calc Af Amer 51 (*) >90 mL/min   Comment:            The eGFR has been calculated     using the CKD EPI equation.     This calculation has not been     validated in all clinical     situations.     eGFR's persistently     <90 mL/min signify     possible Chronic Kidney Disease.  PROTIME-INR     Status: None   Collection Time    10/02/12  4:21 PM      Result Value Range   Prothrombin Time 12.0  11.6 - 15.2 seconds   INR 0.89  0.00 - 1.49  CBC     Status: Abnormal   Collection Time    10/02/12  4:21 PM      Result Value Range   WBC 7.7  4.0 - 10.5 K/uL   RBC 3.49 (*) 4.22 - 5.81  MIL/uL   Hemoglobin 11.4 (*) 13.0 - 17.0 g/dL   HCT 04.5 (*) 40.9 - 81.1 %   MCV 96.0  78.0 - 100.0 fL   MCH 32.7  26.0 - 34.0 pg   MCHC 34.0  30.0 - 36.0 g/dL   RDW 91.4  78.2 - 95.6 %   Platelets 169  150 - 400 K/uL  COMPREHENSIVE METABOLIC PANEL     Status: Abnormal   Collection Time    10/03/12  5:24 AM      Result Value Range   Sodium 141  135 - 145 mEq/L   Potassium 3.8  3.5 - 5.1 mEq/L   Chloride 108  96 - 112 mEq/L   CO2 25  19 - 32 mEq/L   Glucose, Bld 93  70 - 99 mg/dL   BUN 14  6 - 23 mg/dL   Creatinine, Ser 2.13 (*) 0.50 - 1.35 mg/dL   Calcium 8.4  8.4 - 08.6 mg/dL  Total Protein 5.5 (*) 6.0 - 8.3 g/dL   Albumin 3.1 (*) 3.5 - 5.2 g/dL   AST 21  0 - 37 U/L   ALT 21  0 - 53 U/L   Alkaline Phosphatase 82  39 - 117 U/L   Total Bilirubin 0.3  0.3 - 1.2 mg/dL   GFR calc non Af Amer 42 (*) >90 mL/min   GFR calc Af Amer 49 (*) >90 mL/min   Comment:            The eGFR has been calculated     using the CKD EPI equation.     This calculation has not been     validated in all clinical     situations.     eGFR's persistently     <90 mL/min signify     possible Chronic Kidney Disease.  BASIC METABOLIC PANEL     Status: Abnormal   Collection Time    10/04/12  4:10 AM      Result Value Range   Sodium 138  135 - 145 mEq/L   Potassium 3.8  3.5 - 5.1 mEq/L   Chloride 107  96 - 112 mEq/L   CO2 26  19 - 32 mEq/L   Glucose, Bld 91  70 - 99 mg/dL   BUN 13  6 - 23 mg/dL   Creatinine, Ser 1.61 (*) 0.50 - 1.35 mg/dL   Calcium 8.8  8.4 - 09.6 mg/dL   GFR calc non Af Amer 44 (*) >90 mL/min   GFR calc Af Amer 51 (*) >90 mL/min   Comment:            The eGFR has been calculated     using the CKD EPI equation.     This calculation has not been     validated in all clinical     situations.     eGFR's persistently     <90 mL/min signify     possible Chronic Kidney Disease.    Imaging: Imaging results have been reviewed  Assessment/Plan:   1. Principal Problem: 2.    Abnormal nuclear cardiac imaging test 3. Active Problems: 4.   Chronic renal insufficiency, stage III (moderate) 5.   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   Severe mitral regurgitation 7.   RBBB 8.   Hypertension 9.   Time Spent Directly with Patient:  20 minutes  Length of Stay:  LOS: 2 days   S/P cath revealing protected LM/LCX disease and dominant RCA Dz with total SVG to RCA. SCr stable . OK for D/C home. Can start Effient today in addition to ASA in preparation for 2V PCI/Stent next Tuesday 6/24. Will admit Monday, 6/23 for hydration prior to the intervention. Discussed strategy, risks and benefits with pt and wife. Reviewed cath films and 2D with colleagues as well. Hopefully the MR is ischemically mediated and may improve with percutaneous revascularization.   Willie [name] 10/04/2012, 8:44 AM

## 2012-10-04 NOTE — Progress Notes (Signed)
Discharge instructions along with med list provided. IV d/c'd with catheter intact. Awaiting transport to main lobby for d/c home.Willie [name]

## 2012-10-04 NOTE — Discharge Summary (Signed)
Physician Discharge Summary  Patient ID: Willie [name] MRN: [id number] DOB/AGE: 71/07/1940 71 72 y.o.  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 Allyson Sabal, who presented to [facility] on 6/15 to undergo a planned left heart catheterization. He was admitted the day prior to his procedure for pre-cath hydration, due to stage III chronic renal insufficiency. The patient has a history significant for CAD, s/p CABG x 5 in 2007. He had a LIMA to the LAD, a SVG to the diagonal, a sequential vein graft to the first and second obtuse marginal, and a vein graft to the posterolateral artery. The patient had not had any recent anginal symptoms, however, he recently saw Dr. Allyson Sabal in clinic because his primary care physician picked up a murmur on exam. Subsequently, an echo was performed that demonstrated moderate to severe mitral regurgitation, which was changed from the findings of his prior echo in 2012, which demonstrated only mild MR. The mitral leaflets were noted to be thickened with systolic bowing. There was no evidence of prolapse. He was also noted to have grade 2 diastolic dysfunction and elevated left atrial filling pressure. He subsequently underwent a nuclear stress test on 09/08/12, which demonstrated an inferior scar with moderate peri-infarct ischemia, which was a new finding compared to his prior Myoview in 2012. Based on the new findings on his echocardiogram and nuclear stress test, Dr. Allyson Sabal suggested a diagnostic left heart catheterization to assess for ischemia. He was concerned that his new MR was  ischemically mediated and possibly related to one of his grafts. The cath was performed by Dr. Allyson Sabal, via the right femoral artery. It revealed significant graft disease. He had a protected left main off of the circumflex coronary artery, which was 95% stenosed, and an occluded RCA vein graft supplying the dominant right with a high-grade mid stenosis. Dr. Allyson Sabal felt percutaneous revascularization would improve his severe mitral regurgitation. However, due to his renal function, and pre-cath serum creatinine of 1.59, it was decided to delay intervention and have the patient return at at later date for a staged procedure. The patient left the cath lab in stable condition and was kept overnight for hydration. He had no complications. The right femoral access site remained stable and he had no pain. His SCr, post cath, was 1.54. He was last seen and examined by Dr. Allyson Sabal, who determined he was stable for discharge home. The patient was placed on DAPT with ASA and Effient. He will return to [facility] on 6/23 for pre-cath hydration and will undergo two vessel PCI, by Dr. Allyson Sabal, on 6/24.   Consults: None  Significant Diagnostic Studies:   Left Heart Cath 6/16 HEMODYNAMICS:  AO SYSTOLIC/AO DIASTOLIC: 133/67  LV SYSTOLIC/LV DIASTOLIC: 133/13  ANGIOGRAPHIC RESULTS:  1. Left main; 95% distal going into the left circumflex  2. LAD; occluded at the origin  3. Left circumflex; 95% ostial off of the left main. The ramus branch which filled by vein graft is occluded at its origin as well..  4. Right coronary artery; dominant with a 95% fairly focal stenosis in the midportion. The posterior lateral branch was  effectively occluded.  5.LIMA TO LAD; widely patent  6. SVG TO diagonal branch 1 and 2 sequentially was widely patent. There was a 60% stenosis in the continuation of the vein graft between the diagonal branches one and 2.  SVG TO ramus branch had a 60 to percent hypodense mid shaft stenosis  SVG TO dominant right  was occluded at its origin  7. Left ventriculography; RAO left ventriculogram was performed using  25 mL of Visipaque dye at 12 mL/second. The overall LVEF estimated  50-55 % With wall motion abnormalities notable for very mild inferoposterolateral hypokinesia. There was 2-3+ angiographic mitral regurgitation noted.   Treatments: See Hospital Course  Discharge Exam: Blood pressure 128/72, pulse 80, temperature 97.6 F (36.4 C), temperature source Oral, resp. rate 18, height 5\' 10"  (1.778 m), weight 139 lb (63.05 kg), SpO2 98.00%.   Disposition: 01-Home or Self Care      Discharge Orders   Future Appointments Provider Department Dept Phone   11/01/2012 2:45 PM Windell Hummingbird [facility] MEDICAL ONCOLOGY [phone]   11/01/2012 3:15 PM Rana Snare, NP [facility] MEDICAL ONCOLOGY [phone]   Future Orders Complete By Expires     Diet - low sodium heart healthy  As directed     Driving Restrictions  As directed     Comments:      No driving for 3 days    Increase activity slowly  As directed     Lifting restrictions  As directed     Comments:      No lifting objects heavier than a 1/2 gallon of milk for 3 days        Medication List    TAKE these medications       aspirin EC 81 MG tablet  Take 81 mg by mouth every evening.     cholecalciferol 1000 UNITS tablet  Commonly known as:  VITAMIN D  Take 1,000 Units by mouth daily.     CORICIDIN HBP COLD/FLU PO  Take 2 tablets by mouth every 4 (four) hours as needed (for allergy symptoms).     fish oil-omega-3 fatty acids 1000 MG capsule  Take 1 g by mouth daily.     metoprolol tartrate 25 MG tablet  Commonly known as:  LOPRESSOR  Take 25 mg by mouth daily.     multivitamin with minerals Tabs  Take 1 tablet by mouth daily.     nitroGLYCERIN 0.4 MG SL tablet  Commonly known as:  NITROSTAT  Place 1 tablet (0.4 mg total) under the tongue every 5 (five) minutes as needed for chest pain.      Oyster Shell 500 MG Tabs  Take 500 mg by mouth daily.     Potassium 99 MG Tabs  Take 1 tablet by mouth daily.     prasugrel 10 MG Tabs  Commonly known as:  EFFIENT  Take 1 tablet (10 mg total) by mouth daily.     prasugrel 10 MG Tabs  Commonly known as:  EFFIENT  Take 1 tablet (10 mg total) by mouth daily.     rosuvastatin 10 MG tablet  Commonly known as:  CRESTOR  Take 10 mg by mouth 2 (two) times a week. On Wednesday and Sunday     testosterone cypionate 200 MG/ML injection  Commonly known as:  DEPOTESTOTERONE CYPIONATE  Inject 200 mg into the muscle every 14 (fourteen) days. Next injection due 09/15/12     TOVIAZ 4 MG Tb24  Generic drug:  fesoterodine  Take 4 mg by mouth every other day.     vitamin B-12 500 MCG tablet  Commonly known as:  CYANOCOBALAMIN  Take 500 mcg by mouth daily.       Follow-up Information   Follow up with [facility] On 10/10/2012. (The admission office will call you on the morning of 6/23 when a bed is available for pre-cath hydration)    Contact information:   [address] [place] [address] [phone]      Follow up with [facility] On 10/11/2012. (Heart Cath with Dr. Allyson Sabal)    Contact information:   [address] [place] [address] [phone]     TIME SPENT ON DISCHARGE, INCLUDING PHYSICIAN TIME: >30 MINUTES  Signed: Allayne Butcher, PA-C 10/04/2012, 3:25 PM

## 2012-10-10 ENCOUNTER — Observation Stay (HOSPITAL_COMMUNITY)
Admission: RE | Admit: 2012-10-10 | Discharge: 2012-10-12 | Disposition: A | Discharge status: Home / Self Care | Payer: Medicare Other | Source: Ambulatory Visit | Attending: Cardiovascular Disease | Admitting: Cardiovascular Disease

## 2012-10-10 ENCOUNTER — Encounter (HOSPITAL_COMMUNITY): Payer: Self-pay | Admitting: *Deleted

## 2012-10-10 DIAGNOSIS — M810 Age-related osteoporosis without current pathological fracture: Secondary | ICD-10-CM | POA: Insufficient documentation

## 2012-10-10 DIAGNOSIS — I129 Hypertensive chronic kidney disease with stage 1 through stage 4 chronic kidney disease, or unspecified chronic kidney disease: Secondary | ICD-10-CM | POA: Insufficient documentation

## 2012-10-10 DIAGNOSIS — Z951 Presence of aortocoronary bypass graft: Secondary | ICD-10-CM

## 2012-10-10 DIAGNOSIS — I059 Rheumatic mitral valve disease, unspecified: Secondary | ICD-10-CM | POA: Insufficient documentation

## 2012-10-10 DIAGNOSIS — E785 Hyperlipidemia, unspecified: Secondary | ICD-10-CM | POA: Insufficient documentation

## 2012-10-10 DIAGNOSIS — I25810 Atherosclerosis of coronary artery bypass graft(s) without angina pectoris: Secondary | ICD-10-CM | POA: Insufficient documentation

## 2012-10-10 DIAGNOSIS — I2510 Atherosclerotic heart disease of native coronary artery without angina pectoris: Principal | ICD-10-CM | POA: Insufficient documentation

## 2012-10-10 DIAGNOSIS — R931 Abnormal findings on diagnostic imaging of heart and coronary circulation: Secondary | ICD-10-CM

## 2012-10-10 DIAGNOSIS — I4510 Unspecified right bundle-branch block: Secondary | ICD-10-CM | POA: Insufficient documentation

## 2012-10-10 DIAGNOSIS — I10 Essential (primary) hypertension: Secondary | ICD-10-CM | POA: Diagnosis present

## 2012-10-10 DIAGNOSIS — N183 Chronic kidney disease, stage 3 unspecified: Secondary | ICD-10-CM | POA: Insufficient documentation

## 2012-10-10 DIAGNOSIS — I340 Nonrheumatic mitral (valve) insufficiency: Secondary | ICD-10-CM | POA: Diagnosis present

## 2012-10-10 HISTORY — DX: Cardiac murmur, unspecified: R01.1

## 2012-10-10 HISTORY — DX: Atherosclerotic heart disease of native coronary artery without angina pectoris: I25.10

## 2012-10-10 HISTORY — DX: Family history of other specified conditions: Z84.89

## 2012-10-10 HISTORY — DX: Personal history of other medical treatment: Z92.89

## 2012-10-10 HISTORY — DX: Non-Hodgkin lymphoma, unspecified, unspecified site: C85.90

## 2012-10-10 LAB — BASIC METABOLIC PANEL
BUN: 14 mg/dL (ref 6–23)
Chloride: 104 mEq/L (ref 96–112)
Creatinine, Ser: 1.44 mg/dL — ABNORMAL HIGH (ref 0.50–1.35)
GFR calc Af Amer: 55 mL/min — ABNORMAL LOW (ref >90)
GFR calc non Af Amer: 47 mL/min — ABNORMAL LOW (ref >90)
Glucose, Bld: 91 mg/dL (ref 70–99)

## 2012-10-10 LAB — CBC
HCT: 34.2 % — ABNORMAL LOW (ref 39.0–52.0)
MCH: 31.6 pg (ref 26.0–34.0)
MCHC: 32.7 g/dL (ref 30.0–36.0)
RDW: 13.2 % (ref 11.5–15.5)

## 2012-10-10 LAB — PROTIME-INR: INR: 1.01 (ref 0.00–1.49)

## 2012-10-10 MED ORDER — SODIUM CHLORIDE 0.9 % IV SOLN
INTRAVENOUS | Status: DC
  Administered 2012-10-10: 11:00:00 via INTRAVENOUS

## 2012-10-10 MED ORDER — ASPIRIN EC 81 MG PO TBEC
81.0000 mg | DELAYED_RELEASE_TABLET | Freq: Every evening | ORAL | Status: DC
  Administered 2012-10-10 – 2012-10-12 (×2): 81 mg via ORAL
  Filled 2012-10-10 (×3): qty 1

## 2012-10-10 MED ORDER — METOPROLOL TARTRATE 25 MG PO TABS
25.0000 mg | ORAL_TABLET | Freq: Every day | ORAL | Status: DC
  Administered 2012-10-12: 09:00:00 25 mg via ORAL
  Filled 2012-10-10 (×3): qty 1

## 2012-10-10 MED ORDER — NITROGLYCERIN 0.4 MG SL SUBL: 0.4000 mg | SUBLINGUAL_TABLET | SUBLINGUAL | Status: DC | PRN

## 2012-10-10 MED ORDER — SODIUM CHLORIDE 0.9 % IJ SOLN
3.0000 mL | Freq: Two times a day (BID) | INTRAMUSCULAR | Status: DC
  Administered 2012-10-10: 3 mL via INTRAVENOUS

## 2012-10-10 MED ORDER — SODIUM CHLORIDE 0.9 % IJ SOLN: 3.0000 mL | INTRAMUSCULAR | Status: DC | PRN

## 2012-10-10 MED ORDER — OMEGA-3-ACID ETHYL ESTERS 1 G PO CAPS
1.0000 g | ORAL_CAPSULE | Freq: Every day | ORAL | Status: DC
  Administered 2012-10-12: 1 g via ORAL
  Filled 2012-10-10 (×3): qty 1

## 2012-10-10 MED ORDER — PRASUGREL HCL 10 MG PO TABS
10.0000 mg | ORAL_TABLET | Freq: Every day | ORAL | Status: DC
  Administered 2012-10-10 – 2012-10-12 (×2): 10 mg via ORAL
  Filled 2012-10-10 (×3): qty 1

## 2012-10-10 MED ORDER — ASPIRIN 81 MG PO CHEW
324.0000 mg | CHEWABLE_TABLET | ORAL | Status: AC
  Administered 2012-10-11: 324 mg via ORAL
  Filled 2012-10-10: qty 4

## 2012-10-10 MED ORDER — SODIUM CHLORIDE 0.9 % IV SOLN: 250.0000 mL | INTRAVENOUS | Status: DC | PRN

## 2012-10-10 MED ORDER — PRASUGREL HCL 10 MG PO TABS
10.0000 mg | ORAL_TABLET | Freq: Every day | ORAL | Status: DC
  Filled 2012-10-10 (×2): qty 1

## 2012-10-10 NOTE — Care Management Note (Unsigned)
    Page 1 of 1   10/10/2012     2:04:24 PM   CARE MANAGEMENT NOTE 10/10/2012  Patient:  Willie [name], Willie [name]   Account Number:  [id number]  Date Initiated:  10/10/2012  Documentation initiated by:  Germani Gavilanes  Subjective/Objective Assessment:   PT ADM ON 10/10/12 FOR IV HYDRATION PRE-PROCEDURE FOR RENAL PROTECTION.  PTA, PT INDEPENDENT, LIVES WITH SPOUSE.     Action/Plan:   WILL FOLLOW FOR HOME NEEDS AS PT PROGRESSES.   Anticipated DC Date:  10/13/2012   Anticipated DC Plan:  HOME/SELF CARE      DC Planning Services  CM consult      Choice offered to / List presented to:             Status of service:  In process, will continue to follow Medicare Important Message given?   (If response is "NO", the following Medicare IM given date fields will be blank) Date Medicare IM given:   Date Additional Medicare IM given:    Discharge Disposition:    Per UR Regulation:  Reviewed for med. necessity/level of care/duration of stay  If discussed at Long Length of Stay Meetings, dates discussed:    Comments:

## 2012-10-10 NOTE — H&P (Signed)
Willie [name] is an 72 y.o. male.   Chief Complaint: Pre-procedure hydration HPI:    The patient is a 72 y/o male, followed at [facility] by Dr. Allyson Sabal, who presented to [facility] on 6/15 to undergo a planned left heart catheterization. He was admitted the day prior to his procedure for pre-cath hydration, due to stage III chronic renal insufficiency. The patient has a history significant for CAD, s/p CABG x 5 in 2007. He had a LIMA to the LAD, a SVG to the diagonal, a sequential vein graft to the first and second obtuse marginal, and a vein graft to the posterolateral artery. The patient had not had any recent anginal symptoms, however, he recently saw Dr. Allyson Sabal in clinic because his primary care physician picked up a murmur on exam. Subsequently, an echo was performed that demonstrated moderate to severe mitral regurgitation, which was changed from the findings of his prior echo in 2012, which demonstrated only mild MR. The mitral leaflets were noted to be thickened with systolic bowing. There was no evidence of prolapse. He was also noted to have grade 2 diastolic dysfunction and elevated left atrial filling pressure. He subsequently underwent a nuclear stress test on 09/08/12, which demonstrated an inferior scar with moderate peri-infarct ischemia, which was a new finding compared to his prior Myoview in 2012. Subsequent LHC revealed significant graft disease. He had a protected left main off of the circumflex coronary artery, which was 95% stenosed, and an occluded RCA vein graft supplying the dominant right with a high-grade mid stenosis. Dr. Allyson Sabal felt percutaneous revascularization would improve his severe mitral regurgitation. However, due to his renal function, and pre-cath serum creatinine of 1.59, it was decided to delay intervention and have the patient return at at later date for a staged procedure.   The patient returns today for pre-hydration and PCI tomorrow.   Other than a cough, the patient denies  nausea, vomiting, fever, chest pain, shortness of breath, orthopnea, dizziness, PND, congestion, abdominal pain, hematochezia, melena, lower extremity edema.     Past Medical History  Diagnosis Date  . Cancer   . Hypertension   . 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 Low serum testosterone level 11/10/2011  . 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 Nocturia 11/10/2011  . Normochromic anemia 11/10/2011    Likely result of prior high dose chemotherapy  . Unexplained weight loss 05/03/2012  . Mitral valvular regurgitation 06/01/12    moderate to severe by echo- asymptomatic  . Dyslipidemia   . RBBB   . Chronic renal insufficiency, stage III (moderate)     Past Surgical History  Procedure Laterality Date  . Coronary artery bypass graft  03/29/2006  . Limbal stem cell transplant  2010    Family History  Problem Relation Age of Onset  . Cancer - Lung Father 36  . Leukemia Brother 14   Social History:  reports that he quit smoking about 43 years ago. He does not have any smokeless tobacco history on file. He reports that he does not drink alcohol or use illicit drugs.  Allergies:  Allergies  Allergen Reactions  . Niaspan (Niacin Er)     Medications Prior to Admission  Medication Sig Dispense Refill  . aspirin EC 81 MG tablet Take 81 mg by mouth every evening.      Marland Kitchen  Chlorpheniramine-Acetaminophen (CORICIDIN HBP COLD/FLU PO) Take 2 tablets by mouth every 4 (four) hours as needed (for allergy symptoms).      . cholecalciferol (VITAMIN D) 1000 UNITS tablet Take 1,000 Units by mouth daily.       . fesoterodine (TOVIAZ) 4 MG TB24 Take 4 mg by mouth every other day.       . fish oil-omega-3 fatty acids 1000 MG capsule Take 1 g by mouth daily.       . metoprolol tartrate (LOPRESSOR) 25 MG tablet Take 25 mg by mouth daily.       . Multiple Vitamin (MULTIVITAMIN  WITH MINERALS) TABS Take 1 tablet by mouth daily.      . nitroGLYCERIN (NITROSTAT) 0.4 MG SL tablet Place 1 tablet (0.4 mg total) under the tongue every 5 (five) minutes as needed for chest pain.  25 tablet  2  . Oyster Shell 500 MG TABS Take 500 mg by mouth daily.      . Potassium 99 MG TABS Take 1 tablet by mouth daily.      . prasugrel (EFFIENT) 10 MG TABS Take 1 tablet (10 mg total) by mouth daily.  30 tablet  10  . rosuvastatin (CRESTOR) 10 MG tablet Take 10 mg by mouth 2 (two) times a week. On Wednesday and Sunday      . testosterone cypionate (DEPOTESTOTERONE CYPIONATE) 200 MG/ML injection Inject 200 mg into the muscle every 14 (fourteen) days. Next injection due 09/15/12      . vitamin B-12 (CYANOCOBALAMIN) 500 MCG tablet Take 500 mcg by mouth daily.      . [DISCONTINUED] prasugrel (EFFIENT) 10 MG TABS Take 1 tablet (10 mg total) by mouth daily.  30 tablet  0    No results found for this or any previous visit (from the past 48 hour(s)). No results found.  ROS All other ORS negative except that which is stated in the HPI.  Blood pressure 142/86, pulse 91, temperature 98 F (36.7 C), temperature source Oral, resp. rate 20, height 5\' 10"  (1.778 m), weight 134 lb 11.2 oz (61.1 kg), SpO2 96.00%. Physical Exam  Constitutional: He is oriented to person, place, and time. He appears well-developed and well-nourished. No distress.  HENT:  Head: Normocephalic and atraumatic.  Eyes: EOM are normal. Pupils are equal, round, and reactive to light. No scleral icterus.  Neck: Normal range of motion. Neck supple.  Cardiovascular: Normal rate, regular rhythm and S1 normal.  Exam reveals gallop and S3.   Murmur heard.  Systolic murmur is present with a grade of 3/6  Pulses:      Radial pulses are 2+ on the right side, and 2+ on the left side.       Posterior tibial pulses are 2+ on the right side, and 2+ on the left side.  MM loudest at the apex.  Respiratory: Effort normal and breath sounds  normal. He has no wheezes. He has no rales.  GI: Soft. Bowel sounds are normal. He exhibits no distension. There is no tenderness.  Musculoskeletal: He exhibits no edema.  Neurological: He is alert and oriented to person, place, and time.  Skin: Skin is warm.  Psychiatric: He has a normal mood and affect.     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  LHC and PCI tomorrow.  BMET today.    Kandance Yano 10/10/2012, 10:47 AM

## 2012-10-11 ENCOUNTER — Encounter (HOSPITAL_COMMUNITY)
Admission: RE | Disposition: A | Discharge status: Home / Self Care | Payer: Self-pay | Source: Ambulatory Visit | Attending: Cardiovascular Disease

## 2012-10-11 ENCOUNTER — Ambulatory Visit (HOSPITAL_COMMUNITY): Admission: RE | Admit: 2012-10-11 | Payer: Medicare Other | Source: Ambulatory Visit | Admitting: Cardiovascular Disease

## 2012-10-11 ENCOUNTER — Encounter (HOSPITAL_COMMUNITY): Payer: Self-pay | Admitting: General Practice

## 2012-10-11 DIAGNOSIS — I2510 Atherosclerotic heart disease of native coronary artery without angina pectoris: Secondary | ICD-10-CM

## 2012-10-11 HISTORY — PX: PERCUTANEOUS CORONARY STENT INTERVENTION (PCI-S): SHX5485

## 2012-10-11 HISTORY — PX: CORONARY ANGIOPLASTY WITH STENT PLACEMENT: SHX49

## 2012-10-11 LAB — BASIC METABOLIC PANEL
BUN: 15 mg/dL (ref 6–23)
CO2: 25 mEq/L (ref 19–32)
Calcium: 8.3 mg/dL — ABNORMAL LOW (ref 8.4–10.5)
Chloride: 106 mEq/L (ref 96–112)
Creatinine, Ser: 1.44 mg/dL — ABNORMAL HIGH (ref 0.50–1.35)

## 2012-10-11 LAB — POCT ACTIVATED CLOTTING TIME: Activated Clotting Time: 360 seconds

## 2012-10-11 SURGERY — PERCUTANEOUS CORONARY STENT INTERVENTION (PCI-S)
Anesthesia: LOCAL

## 2012-10-11 MED ORDER — PRASUGREL HCL 10 MG PO TABS
ORAL_TABLET | ORAL | Status: AC
  Filled 2012-10-11: qty 3

## 2012-10-11 MED ORDER — HEPARIN (PORCINE) IN NACL 2-0.9 UNIT/ML-% IJ SOLN
INTRAMUSCULAR | Status: AC
  Filled 2012-10-11: qty 500

## 2012-10-11 MED ORDER — MORPHINE SULFATE 2 MG/ML IJ SOLN: 1.0000 mg | INTRAMUSCULAR | Status: DC | PRN

## 2012-10-11 MED ORDER — NITROGLYCERIN 0.2 MG/ML ON CALL CATH LAB
INTRAVENOUS | Status: AC
  Filled 2012-10-11: qty 1

## 2012-10-11 MED ORDER — FENTANYL CITRATE 0.05 MG/ML IJ SOLN
INTRAMUSCULAR | Status: AC
  Filled 2012-10-11: qty 2

## 2012-10-11 MED ORDER — SODIUM CHLORIDE 0.9 % IV SOLN
INTRAVENOUS | Status: AC
  Administered 2012-10-11: 15:00:00 via INTRAVENOUS

## 2012-10-11 MED ORDER — HEPARIN (PORCINE) IN NACL 2-0.9 UNIT/ML-% IJ SOLN
INTRAMUSCULAR | Status: AC
  Filled 2012-10-11: qty 1000

## 2012-10-11 MED ORDER — MIDAZOLAM HCL 2 MG/2ML IJ SOLN
INTRAMUSCULAR | Status: AC
  Filled 2012-10-11: qty 2

## 2012-10-11 MED ORDER — LIDOCAINE HCL (PF) 1 % IJ SOLN
INTRAMUSCULAR | Status: AC
  Filled 2012-10-11: qty 30

## 2012-10-11 MED ORDER — ACETAMINOPHEN 325 MG PO TABS: 650.0000 mg | ORAL_TABLET | ORAL | Status: DC | PRN

## 2012-10-11 MED ORDER — ONDANSETRON HCL 4 MG/2ML IJ SOLN: 4.0000 mg | Freq: Four times a day (QID) | INTRAMUSCULAR | Status: DC | PRN

## 2012-10-11 MED ORDER — BIVALIRUDIN 250 MG IV SOLR
INTRAVENOUS | Status: AC
  Filled 2012-10-11: qty 250

## 2012-10-11 NOTE — Progress Notes (Signed)
Site area: right groin  Site Prior to Removal:  Level 1  Pressure Applied For 25 MINUTES    Minutes Beginning at 1115 Manual:   yes  Patient Status During Pull:  stable  Post Pull Groin Site:  Level 1  Post Pull Instructions Given:  yes  Post Pull Pulses Present:  yes  Dressing Applied:  yes  Comments:  Gauze dressing secured with medipore tape

## 2012-10-11 NOTE — Progress Notes (Signed)
Notified Nada Boozer NP of patients oozing at groin site over 5 hours after initial sheath pull and hold. Patient had oozing at 1500 and pressure held for 10 minutes with new pressure dressing applied. Rechecked and pressure dressing saturated at 1700. Oozing noted from cath site and pressure held. Nada Boozer NP notified and instructed to hold pressure and continue with monitoring at this time. Dr. Allyson Sabal notified by Nada Boozer NP. Patient and family at bedside where informed of plan of care and verbalized understanding.

## 2012-10-11 NOTE — CV Procedure (Signed)
Willie [name] is a 72 y.o. male    [id number] LOCATION:  FACILITY: [facility]  PHYSICIAN: Nanetta Batty, M.D. [birth year]   DATE OF PROCEDURE:  10/11/2012  DATE OF DISCHARGE:   Coronary Intervention    History obtained from chart review.The patient is a 72 y/o male, followed at [facility] by Dr. Allyson Sabal, who presented to [facility] on 6/15 to undergo a planned left heart catheterization. He was admitted the day prior to his procedure for pre-cath hydration, due to stage III chronic renal insufficiency. The patient has a history significant for CAD, s/p CABG x 5 in 2007. He had a LIMA to the LAD, a SVG to the diagonal, a sequential vein graft to the first and second obtuse marginal, and a vein graft to the posterolateral artery. The patient had not had any recent anginal symptoms, however, he recently saw Dr. Allyson Sabal in clinic because his primary care physician picked up a murmur on exam. Subsequently, an echo was performed that demonstrated moderate to severe mitral regurgitation, which was changed from the findings of his prior echo in 2012, which demonstrated only mild MR. The mitral leaflets were noted to be thickened with systolic bowing. There was no evidence of prolapse. He was also noted to have grade 2 diastolic dysfunction and elevated left atrial filling pressure. He subsequently underwent a nuclear stress test on 09/08/12, which demonstrated an inferior scar with moderate peri-infarct ischemia, which was a new finding compared to his prior Myoview in 2012. Subsequent LHC revealed significant graft disease. He had a protected left main off of the circumflex coronary artery, which was 95% stenosed, and an occluded RCA vein graft supplying the dominant right with a high-grade mid stenosis. Dr. Allyson Sabal felt percutaneous revascularization would improve his severe mitral regurgitation. However, due to his renal function, and pre-cath serum creatinine of 1.59, it was decided to delay intervention and have the patient  return at at later date for a staged procedure.  Marland Kitchen     PROCEDURE DESCRIPTION:    The patient was brought to the second floor [place] [facility]iac cath lab in the postabsorptive state. He was premedicated with Valium 5 mg by mouth, IV Versed and fentanyl. His right groin was prepped and shaved in usual sterile fashion. Xylocaine 1% was used for local anesthesia. A 6 French sheath was inserted into the right common femoral artery using standard Seldinger technique.   HEMODYNAMICS:    AO SYSTOLIC/AO DIASTOLIC: 130/69     ANGIOGRAPHIC RESULTS:   1: RCA-the RCA was approached initially. She was on at the end orally for the last 5 days and received additional 30 mg of Effient  load today. Angiomax bolus was given with an ACT of 360. A total of 100 cc of contrast was administered to the patient. Using a 6 [place] JR 4 guide catheter along with an 014 pro water guidewire an 2mm x 12 mm long balloon predilatation was performed. Following this stenting was performed with a 3.0 mm x 18 mm long Xpeditions drug-eluting stent. Post dilatation was then performed with a 3.25 mm x 15 mm long noncompliant balloon at 16 atmospheres (3.3 mm) resulting in reduction of a 90% mid dominant RCA stenosis to 0% residual.  2: Left main-protected left main intervention into the circumflex coronary artery was then performed. Using a 6 [place] XB 3.5 cm guide catheter along with the same guidewire and the predilatation balloon the distal left main was dilated. Following this a 2.5 mm x 15 mm long Xpeditions drug-eluting  stent was then deployed at 14 atmospheres and postdilated 27 5 x 12 [place] sprinter at 16 atmospheres (2.9 mm) resulting reduction of 90% distal left main to 0% residual with excellent flow.  IMPRESSION:successful 2 vessel PTCI stenting using drug-eluting stents and Angiomax . The patient was already on aspirin and Effient . He'll be hydrated overnight and discharged home in the morning. I will see him back in  followup in approximately one week.  Runell Gess MD, [facility] 10/11/2012 8:45 AM

## 2012-10-11 NOTE — Progress Notes (Signed)
Pressure held on right groin for 15 minutes with no noted oozing. Pressure dressing applied gauze with medipore tape. Patient holding groin site. Rechecked at 1745 with no bleeding, oozing or swelling noted. Pulses present.

## 2012-10-11 NOTE — H&P (Signed)
    Pt was reexamined and existing H & P reviewed. No changes found.  Runell Gess, MD [facility] 10/11/2012 7:45 AM

## 2012-10-11 NOTE — Progress Notes (Signed)
Right groin cath site checked q 30 minutes x 2 then q 1 hour with no noted oozing, bleeding, or swelling. Pulses present and unchanged. Dressing dry and intact. Report given to [place] and site assessed. Reviewed plan of care with family at bedside.

## 2012-10-12 DIAGNOSIS — Z951 Presence of aortocoronary bypass graft: Secondary | ICD-10-CM

## 2012-10-12 DIAGNOSIS — I059 Rheumatic mitral valve disease, unspecified: Secondary | ICD-10-CM

## 2012-10-12 DIAGNOSIS — I2510 Atherosclerotic heart disease of native coronary artery without angina pectoris: Secondary | ICD-10-CM

## 2012-10-12 DIAGNOSIS — I4510 Unspecified right bundle-branch block: Secondary | ICD-10-CM

## 2012-10-12 DIAGNOSIS — R9439 Abnormal result of other cardiovascular function study: Secondary | ICD-10-CM

## 2012-10-12 LAB — CBC
Hemoglobin: 11.9 g/dL — ABNORMAL LOW (ref 13.0–17.0)
MCH: 32.3 pg (ref 26.0–34.0)
MCHC: 33.7 g/dL (ref 30.0–36.0)
Platelets: 162 10*3/uL (ref 150–400)
RBC: 3.68 MIL/uL — ABNORMAL LOW (ref 4.22–5.81)

## 2012-10-12 LAB — BASIC METABOLIC PANEL
Calcium: 9 mg/dL (ref 8.4–10.5)
GFR calc non Af Amer: 45 mL/min — ABNORMAL LOW (ref >90)
Potassium: 4.1 mEq/L (ref 3.5–5.1)
Sodium: 137 mEq/L (ref 135–145)

## 2012-10-12 MED ORDER — ENSURE COMPLETE PO LIQD
237.0000 mL | Freq: Two times a day (BID) | ORAL | Status: DC
  Filled 2012-10-12 (×3): qty 237

## 2012-10-12 MED ORDER — ACTIVE PARTNERSHIP FOR HEALTH OF YOUR HEART BOOK
Freq: Once | Status: AC
  Administered 2012-10-12: 01:00:00
  Filled 2012-10-12: qty 1

## 2012-10-12 MED FILL — Sodium Chloride IV Soln 0.9%: INTRAVENOUS | Qty: 50 | Status: AC

## 2012-10-12 NOTE — Progress Notes (Signed)
Subjective:  No CP/SOB  Objective:  Temp:  [97.6 F (36.4 C)-98.2 F (36.8 C)] 97.6 F (36.4 C) (06/25 0810) Pulse Rate:  [62-96] 83 (06/25 0810) Resp:  [16-20] 20 (06/25 0810) BP: (122-174)/(50-83) 127/75 mmHg (06/25 0810) SpO2:  [99 %-100 %] 100 % (06/25 0810) Weight:  [133 lb 9.6 oz (60.6 kg)] 133 lb 9.6 oz (60.6 kg) (06/25 0027) Weight change: -1 lb 1.6 oz (-0.5 kg)  Intake/Output from previous day: 06/24 0701 - 06/25 0700 In: 702.5 [P.O.:240; I.V.:462.5] Out: 3275 [Urine:3275]  Intake/Output from this shift:    Physical Exam: General appearance: alert and no distress Neck: no adenopathy, no carotid bruit, no JVD, supple, symmetrical, trachea midline and thyroid not enlarged, symmetric, no tenderness/mass/nodules Lungs: clear to auscultation bilaterally Heart: 3+ apical SEM c/w MR Extremities: right groin OK  Lab Results: Results for orders placed during the hospital encounter of 10/10/12 (from the past 48 hour(s))  BASIC METABOLIC PANEL     Status: Abnormal   Collection Time    10/10/12 11:45 AM      Result Value Range   Sodium 136  135 - 145 mEq/L   Potassium 3.7  3.5 - 5.1 mEq/L   Chloride 104  96 - 112 mEq/L   CO2 25  19 - 32 mEq/L   Glucose, Bld 91  70 - 99 mg/dL   BUN 14  6 - 23 mg/dL   Creatinine, Ser 9.81 (*) 0.50 - 1.35 mg/dL   Calcium 8.6  8.4 - 19.1 mg/dL   GFR calc non Af Amer 47 (*) >90 mL/min   GFR calc Af Amer 55 (*) >90 mL/min   Comment:            The eGFR has been calculated     using the CKD EPI equation.     This calculation has not been     validated in all clinical     situations.     eGFR's persistently     <90 mL/min signify     possible Chronic Kidney Disease.  CBC     Status: Abnormal   Collection Time    10/10/12  1:20 PM      Result Value Range   WBC 6.4  4.0 - 10.5 K/uL   RBC 3.54 (*) 4.22 - 5.81 MIL/uL   Hemoglobin 11.2 (*) 13.0 - 17.0 g/dL   HCT 47.8 (*) 29.5 - 62.1 %   MCV 96.6  78.0 - 100.0 fL   MCH 31.6  26.0 -  34.0 pg   MCHC 32.7  30.0 - 36.0 g/dL   RDW 30.8  65.7 - 84.6 %   Platelets 163  150 - 400 K/uL  PROTIME-INR     Status: None   Collection Time    10/10/12  1:20 PM      Result Value Range   Prothrombin Time 13.2  11.6 - 15.2 seconds   INR 1.01  0.00 - 1.49  BASIC METABOLIC PANEL     Status: Abnormal   Collection Time    10/11/12  3:38 AM      Result Value Range   Sodium 138  135 - 145 mEq/L   Potassium 3.9  3.5 - 5.1 mEq/L   Chloride 106  96 - 112 mEq/L   CO2 25  19 - 32 mEq/L   Glucose, Bld 83  70 - 99 mg/dL   BUN 15  6 - 23 mg/dL   Creatinine, Ser 9.62 (*) 0.50 -  1.35 mg/dL   Calcium 8.3 (*) 8.4 - 10.5 mg/dL   GFR calc non Af Amer 47 (*) >90 mL/min   GFR calc Af Amer 55 (*) >90 mL/min   Comment:            The eGFR has been calculated     using the CKD EPI equation.     This calculation has not been     validated in all clinical     situations.     eGFR's persistently     <90 mL/min signify     possible Chronic Kidney Disease.  POCT ACTIVATED CLOTTING TIME     Status: None   Collection Time    10/11/12  7:57 AM      Result Value Range   Activated Clotting Time 360    CBC     Status: Abnormal   Collection Time    10/12/12  5:21 AM      Result Value Range   WBC 8.0  4.0 - 10.5 K/uL   RBC 3.68 (*) 4.22 - 5.81 MIL/uL   Hemoglobin 11.9 (*) 13.0 - 17.0 g/dL   HCT 16.1 (*) 09.6 - 04.5 %   MCV 95.9  78.0 - 100.0 fL   MCH 32.3  26.0 - 34.0 pg   MCHC 33.7  30.0 - 36.0 g/dL   RDW 40.9  81.1 - 91.4 %   Platelets 162  150 - 400 K/uL  BASIC METABOLIC PANEL     Status: Abnormal   Collection Time    10/12/12  5:21 AM      Result Value Range   Sodium 137  135 - 145 mEq/L   Potassium 4.1  3.5 - 5.1 mEq/L   Chloride 105  96 - 112 mEq/L   CO2 26  19 - 32 mEq/L   Glucose, Bld 86  70 - 99 mg/dL   BUN 15  6 - 23 mg/dL   Creatinine, Ser 7.82 (*) 0.50 - 1.35 mg/dL   Calcium 9.0  8.4 - 95.6 mg/dL   GFR calc non Af Amer 45 (*) >90 mL/min   GFR calc Af Amer 52 (*) >90 mL/min    Comment:            The eGFR has been calculated     using the CKD EPI equation.     This calculation has not been     validated in all clinical     situations.     eGFR's persistently     <90 mL/min signify     possible Chronic Kidney Disease.    Imaging: Imaging results have been reviewed  Assessment/Plan:   1. Principal Problem: 2.   CAD (coronary artery disease) 3. Active Problems: 4.   Chronic renal insufficiency, stage III (moderate) 5.   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   Severe mitral regurgitation 7.   Hypertension 8.   Time Spent Directly with Patient:  20 minutes  Length of Stay:  LOS: 2 days   Pt is S/P 2 V PCI/Stent. Presumed ischemic MR. DES. On ASA and effient. Pt has not had his BB in 2 days which probably explains his sinus tach. Exam benign. OK for D/C home this afternoon. Ambulate and check groin. ROV with MLP 1-2 weeks and with me after that. Plan to get a F/U 2D in 3 months to follow MR.  Runell Gess 10/12/2012, 8:54 AM

## 2012-10-12 NOTE — Progress Notes (Signed)
.  CARDIAC REHAB PHASE I   PRE:  Rate/Rhythm: 93 SR  BP:  Supine: 127/79  Sitting:   Standing:    SaO2:   MODE:  Ambulation: 1000 ft   POST:  Rate/Rhythm: 140 ST  BP:  Supine:   Sitting: 152/78  Standing:    SaO2:  0855-1000  Pt tolerated ambulation well without c/o of cp or SOB. HR after walk 140, pt denies any symptoms with increased HR. Pt back to side of bed after walk. Completed stent education with pt. and wife. They voice understanding. Pt declines Outpt. CRP due to his work hours.  Melina Copa RN 10/12/2012 10:01 AM

## 2012-10-12 NOTE — Progress Notes (Signed)
INITIAL NUTRITION ASSESSMENT  DOCUMENTATION CODES Per approved criteria  -Severe malnutrition in the context of chronic illness   INTERVENTION: 1. Ensure Complete BID between meals (8 oz contains 350 kcal, 13 g protein).  2. Patient encouraged to order foods per preference from menu.   NUTRITION DIAGNOSIS: Inadequate oral intake related to food preferences as evidenced by 50% meal intake.   Goal: Patient will meet >/=90% of estimated nutrition needs.  Monitor:  PO intake, weight, labs  Reason for Assessment: Malnutrition screening tool, score of 2  72 y.o. male  Admitting Dx: CAD (coronary artery disease)  ASSESSMENT: Patient admitted with CAD for pre-hydration and left cardiac cath. He reports that his appetite is usually good, but he is eating 50% of his meals here due to food preferences. He has lost about 11 pounds in the last month (8% of UBW) with severe depletion of muscle mass at clavicle, shoulder, calf, and knee, and fat mass depletion at the tricep, meeting the criteria for severe malnutrition in the context of chronic illness.   Height: Ht Readings from Last 1 Encounters:  10/10/12 5\' 10"  (1.778 m)    Weight: Wt Readings from Last 1 Encounters:  10/12/12 133 lb 9.6 oz (60.6 kg)    Ideal Body Weight: 166 pounds  % Ideal Body Weight: 80%  Wt Readings from Last 10 Encounters:  10/12/12 133 lb 9.6 oz (60.6 kg)  10/12/12 133 lb 9.6 oz (60.6 kg)  10/02/12 139 lb (63.05 kg)  10/02/12 139 lb (63.05 kg)  09/08/12 144 lb (65.318 kg)  08/30/12 146 lb (66.225 kg)  05/03/12 142 lb 11.2 oz (64.728 kg)  11/10/11 152 lb 4.8 oz (69.083 kg)  05/12/11 153 lb 8 oz (69.627 kg)    Usual Body Weight: 144 pounds  % Usual Body Weight: 92%  BMI:  Body mass index is 19.17 kg/(m^2).  Patient is normal weight.   Estimated Nutritional Needs: Kcal: 1650-1800 kcal Protein: 80-90 g Fluid: 1.8-2.0 L  Skin: Intact  Diet Order: Cardiac  EDUCATION NEEDS: -No education  needs identified at this time   Intake/Output Summary (Last 24 hours) at 10/12/12 1237 Last data filed at 10/12/12 1100  Gross per 24 hour  Intake  942.5 ml  Output   3275 ml  Net -2332.5 ml    Last BM: 6/25   Labs:   Recent Labs Lab 10/10/12 1145 10/11/12 0338 10/12/12 0521  NA 136 138 137  K 3.7 3.9 4.1  CL 104 106 105  CO2 25 25 26   BUN 14 15 15   CREATININE 1.44* 1.44* 1.51*  CALCIUM 8.6 8.3* 9.0  GLUCOSE 91 83 86    CBG (last 3)  No results found for this basename: GLUCAP,  in the last 72 hours  Scheduled Meds: . aspirin EC  81 mg Oral QPM  . metoprolol tartrate  25 mg Oral Daily  . omega-3 acid ethyl esters  1 g Oral Daily  . prasugrel  10 mg Oral QHS    Continuous Infusions:   Past Medical History  Diagnosis Date  . Hypertension   . History of DVT of lower extremity     06/2001 as first sign of lymphoma: enlarged left inguinal nodes  . Low serum testosterone level 11/10/2011  . 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 Nocturia 11/10/2011  . Normochromic anemia 11/10/2011    Likely result of prior high dose chemotherapy  . Unexplained weight loss 05/03/2012  . Mitral valvular regurgitation 06/01/12  moderate to severe by echo- asymptomatic  . Dyslipidemia   . RBBB   . Coronary artery disease   . Family history of anesthesia complication     "son, Prudencio Burly, larynx collapses" (10/11/2012)  . Heart murmur   . History of blood transfusion 2009-2010    "both related to his chemo" (10/11/2012)  . Chronic renal insufficiency, stage III (moderate)   . Non Hodgkin's lymphoma 2003; 2009    "chemo; stem cell transplant & high powered chemo" (10/10/2012)    Past Surgical History  Procedure Laterality Date  . Bone marrow transplant  2010  . Cardiac catheterization  10/03/2012; 2007  . Coronary angioplasty with stent placement  10/11/2012    "2" (10/11/2012)  . Tonsillectomy  1950's  . Cataract extraction w/ intraocular lens implant Left 1990's  .  Coronary artery bypass graft  03/29/2006    "CABG X5" (10/11/2012)    Linnell Fulling, RD, LDN Pager #: [phone] After-Hours Pager #: [phone]

## 2012-10-13 NOTE — Discharge Summary (Signed)
Physician Discharge Summary  Patient ID: Willie [name] MRN: [id number] DOB/AGE: 1940-04-27 72 y.o.  Admit date: 10/10/2012 Discharge date: 10/12/2012  Admission Diagnoses:  Coronary artery disease  Discharge Diagnoses:  Principal Problem:   CAD (coronary artery disease) Active Problems:   Chronic renal insufficiency, stage III (moderate)   S/P CABG x 5  12/07   Severe mitral regurgitation   Hypertension    Discharged Condition: stable  Hospital Course:   The patient is a 72 y/o male, followed at [facility] by Dr. Allyson Sabal, who presented to [facility] on 6/15 to undergo a planned left heart catheterization. He was admitted the day prior to his procedure for pre-cath hydration, due to stage III chronic renal insufficiency. The patient has a history significant for CAD, s/p CABG x 5 in 2007. He had a LIMA to the LAD, a SVG to the diagonal, a sequential vein graft to the first and second obtuse marginal, and a vein graft to the posterolateral artery. The patient had not had any recent anginal symptoms, however, he recently saw Dr. Allyson Sabal in clinic because his primary care physician picked up a murmur on exam. Subsequently, an echo was performed that demonstrated moderate to severe mitral regurgitation, which was changed from the findings of his prior echo in 2012, which demonstrated only mild MR. The mitral leaflets were noted to be thickened with systolic bowing. There was no evidence of prolapse. He was also noted to have grade 2 diastolic dysfunction and elevated left atrial filling pressure. He subsequently underwent a nuclear stress test on 09/08/12, which demonstrated an inferior scar with moderate peri-infarct ischemia, which was a new finding compared to his prior Myoview in 2012. Subsequent LHC revealed significant graft disease. He had a protected left main off of the circumflex coronary artery, which was 95% stenosed, and an occluded RCA vein graft supplying the dominant right with a high-grade mid  stenosis. Dr. Allyson Sabal felt percutaneous revascularization would improve his severe mitral regurgitation. However, due to his renal function, and pre-cath serum creatinine of 1.59, it was decided to delay intervention and have the patient return at at later date for a staged procedure.   Patient underwent successful 2 vessel PTCI stenting using drug-eluting stents and Angiomax.  She was discharged on aspirin and effient.  She is tachycardic but had not had her beta blocker in 2 days. It was administered after the catheterization which improved her heart rate. Patient was discharged home in stable condition after being seen by Dr. Allyson Sabal.  Needs 2-D echocardiogram 3 months to evaluate MR.  Consults: None  Significant Diagnostic Studies:   Left heart catheterization  HEMODYNAMICS:  AO SYSTOLIC/AO DIASTOLIC: 130/69  ANGIOGRAPHIC RESULTS:  1: RCA-the RCA was approached initially. She was on at the end orally for the last 5 days and received additional 30 mg of Effient load today. Angiomax bolus was given with an ACT of 360. A total of 100 cc of contrast was administered to the patient. Using a 6 [place] JR 4 guide catheter along with an 014 pro water guidewire an 2mm x 12 mm long balloon predilatation was performed. Following this stenting was performed with a 3.0 mm x 18 mm long Xpeditions drug-eluting stent. Post dilatation was then performed with a 3.25 mm x 15 mm long noncompliant balloon at 16 atmospheres (3.3 mm) resulting in reduction of a 90% mid dominant RCA stenosis to 0% residual.  2: Left main-protected left main intervention into the circumflex coronary artery was then performed. Using a 6  [place] XB 3.5 cm guide catheter along with the same guidewire and the predilatation balloon the distal left main was dilated. Following this a 2.5 mm x 15 mm long Xpeditions drug-eluting stent was then deployed at 14 atmospheres and postdilated 27 5 x 12 [place] sprinter at 16 atmospheres (2.9 mm) resulting reduction  of 90% distal left main to 0% residual with excellent flow.  IMPRESSION:successful 2 vessel PTCI stenting using drug-eluting stents and Angiomax . The patient was already on aspirin and Effient . He'll be hydrated overnight and discharged home in the morning. I will see him back in followup in approximately one week.  Runell Gess MD, [facility]  10/11/2012  Treatments: See above  Discharge Exam: Blood pressure 123/84, pulse 95, temperature 97.7 F (36.5 C), temperature source Oral, resp. rate 20, height 5\' 10"  (1.778 m), weight 133 lb 9.6 oz (60.6 kg), SpO2 100.00%.   Disposition: 01-Home or Self Care  Discharge Orders   Future Appointments Provider Department Dept Phone   11/01/2012 2:45 PM Windell Hummingbird [facility] MEDICAL ONCOLOGY [phone]   11/01/2012 3:15 PM Rana Snare, NP [facility] MEDICAL ONCOLOGY [phone]   Future Orders Complete By Expires     Diet - low sodium heart healthy  As directed     Discharge instructions  As directed     Comments:      No lifting more than a half gallon of milk or driving for 3 days    Increase activity slowly  As directed         Medication List    TAKE these medications       aspirin EC 81 MG tablet  Take 81 mg by mouth every evening.     cholecalciferol 1000 UNITS tablet  Commonly known as:  VITAMIN D  Take 1,000 Units by mouth daily.     CORICIDIN HBP COLD/FLU PO  Take 2 tablets by mouth every 4 (four) hours as needed (for allergy symptoms).     fish oil-omega-3 fatty acids 1000 MG capsule  Take 1 g by mouth daily.     metoprolol tartrate 25 MG tablet  Commonly known as:  LOPRESSOR  Take 25 mg by mouth daily.     multivitamin with minerals Tabs  Take 1 tablet by mouth daily.     nitroGLYCERIN 0.4 MG SL tablet  Commonly known as:  NITROSTAT  Place 1 tablet (0.4 mg total) under the tongue every 5 (five) minutes as needed for chest pain.     Oyster Shell 500 MG Tabs  Take 500 mg by  mouth daily.     Potassium 99 MG Tabs  Take 1 tablet by mouth daily.     prasugrel 10 MG Tabs  Commonly known as:  EFFIENT  Take 1 tablet (10 mg total) by mouth daily.     rosuvastatin 10 MG tablet  Commonly known as:  CRESTOR  Take 10 mg by mouth 2 (two) times a week. On Wednesday and Sunday     testosterone cypionate 200 MG/ML injection  Commonly known as:  DEPOTESTOTERONE CYPIONATE  Inject 200 mg into the muscle every 14 (fourteen) days. Next injection due 09/15/12     TOVIAZ 4 MG Tb24  Generic drug:  fesoterodine  Take 4 mg by mouth every other day.     vitamin B-12 500 MCG tablet  Commonly known as:  CYANOCOBALAMIN  Take 500 mcg by mouth daily.  Follow-up Information   Follow up with Abelino Derrick, PA-C. (Our office will call you with the follow up appointment time)    Contact information:   [address] [place] [address] [phone]       Signed: Wilburt Finlay 10/13/2012, 1:19 PM

## 2012-11-01 ENCOUNTER — Telehealth: Payer: Self-pay | Admitting: Oncology

## 2012-11-01 ENCOUNTER — Ambulatory Visit (HOSPITAL_BASED_OUTPATIENT_CLINIC_OR_DEPARTMENT_OTHER): Payer: Medicare Other | Admitting: Nurse Practitioner

## 2012-11-01 ENCOUNTER — Ambulatory Visit: Payer: Medicare Other | Admitting: Physician Assistant

## 2012-11-01 ENCOUNTER — Other Ambulatory Visit (HOSPITAL_BASED_OUTPATIENT_CLINIC_OR_DEPARTMENT_OTHER): Payer: Self-pay | Admitting: Lab

## 2012-11-01 VITALS — BP 114/65 | HR 75 | Temp 98.0°F | Resp 20 | Ht 70.0 in | Wt 141.6 lb

## 2012-11-01 DIAGNOSIS — C8445 Peripheral T-cell lymphoma, not classified, lymph nodes of inguinal region and lower limb: Secondary | ICD-10-CM

## 2012-11-01 LAB — CBC WITH DIFFERENTIAL/PLATELET
BASO%: 0.2 % (ref 0.0–2.0)
Eosinophils Absolute: 0.1 10*3/uL (ref 0.0–0.5)
LYMPH%: 17 % (ref 14.0–49.0)
MCHC: 33.5 g/dL (ref 32.0–36.0)
MONO#: 0.6 10*3/uL (ref 0.1–0.9)
NEUT#: 4.4 10*3/uL (ref 1.5–6.5)
RBC: 3.45 10*6/uL — ABNORMAL LOW (ref 4.20–5.82)
RDW: 13.6 % (ref 11.0–14.6)
WBC: 6.2 10*3/uL (ref 4.0–10.3)
lymph#: 1 10*3/uL (ref 0.9–3.3)

## 2012-11-01 LAB — COMPREHENSIVE METABOLIC PANEL (CC13)
ALT: 27 U/L (ref 0–55)
AST: 26 U/L (ref 5–34)
Albumin: 3.6 g/dL (ref 3.5–5.0)
Alkaline Phosphatase: 84 U/L (ref 40–150)
BUN: 12.7 mg/dL (ref 7.0–26.0)
Potassium: 4.1 mEq/L (ref 3.5–5.1)
Sodium: 139 mEq/L (ref 136–145)

## 2012-11-01 LAB — SEDIMENTATION RATE: Sed Rate: 5 mm/hr (ref 0–16)

## 2012-11-01 LAB — LACTATE DEHYDROGENASE (CC13): LDH: 170 U/L (ref 125–245)

## 2012-11-01 NOTE — Telephone Encounter (Signed)
gv and printed appt sched and avs for pt  °

## 2012-11-01 NOTE — Progress Notes (Signed)
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 He had an initial complete response to 6 cycles of CHOP chemotherapy. He relapsed October 2009 with extensive infiltration of tumor into the right iliopsoas muscle presenting as an erythematous nodular cutaneous mass in the right inguinal region. No other disease outside of the muscle noted on PET scan. He again achieved remission with ICE chemotherapy and then went onto receive BCNU, etoposide and Cytoxan conditioning followed by autologous stem cell transplant at [facility] in January 2010.  He is seen today for scheduled followup. He reports a good appetite. Weight overall is stable. He continues to work. No fevers or sweats. No chest pain or shortness of breath.  He was recently found to have moderate to severe mitral regurgitation. He underwent two-vessel PTCI stenting using drug-eluting stents and Angiomax in June of this year.   Objective: Blood pressure 114/65, pulse 75, temperature 98 F (36.7 C), temperature source Oral, resp. rate 20, height 5\' 10"  (1.778 m), weight 141 lb 9.6 oz (64.229 kg).  Oropharynx is without thrush or ulceration. No palpable cervical, supraclavicular, axillary or inguinal lymph nodes. Lungs are clear. Regular cardiac rhythm. 3/6 systolic ejection murmur. Abdomen soft and nontender. No organomegaly. Extremities without edema.  Lab Results: Lab Results  Component Value Date   WBC 6.2 11/01/2012   HGB 11.4* 11/01/2012   HCT 34.1* 11/01/2012   MCV 98.7* 11/01/2012   PLT 181 11/01/2012    Chemistry:    Chemistry      Component Value Date/Time   NA 139 11/01/2012 1450   NA 137 10/12/2012 0521   NA 143 04/29/2011 1201   K 4.1 11/01/2012 1450   K 4.1 10/12/2012 0521   K 4.2 04/29/2011 1201   CL 105  10/12/2012 0521   CL 106 05/03/2012 1149   CL 102 04/29/2011 1201   CO2 26 11/01/2012 1450   CO2 26 10/12/2012 0521   CO2 29 04/29/2011 1201   BUN 12.7 11/01/2012 1450   BUN 15 10/12/2012 0521   BUN 15 04/29/2011 1201   CREATININE 1.6* 11/01/2012 1450   CREATININE 1.51* 10/12/2012 0521   CREATININE 1.3* 04/29/2011 1201      Component Value Date/Time   CALCIUM 9.4 11/01/2012 1450   CALCIUM 9.0 10/12/2012 0521   CALCIUM 9.2 04/29/2011 1201   ALKPHOS 84 11/01/2012 1450   ALKPHOS 82 10/03/2012 0524   ALKPHOS 111* 04/29/2011 1201   AST 26 11/01/2012 1450   AST 21 10/03/2012 0524   AST 30 04/29/2011 1201   ALT 27 11/01/2012 1450   ALT 21 10/03/2012 0524   BILITOT 0.54 11/01/2012 1450   BILITOT 0.3 10/03/2012 0524   BILITOT 0.60 04/29/2011 1201       Studies/Results: No results found.  Medications: I have reviewed the patient's current medications.  Assessment/Plan:  1. ALK positive T-cell lymphoma. Initial diagnosis March 2003 presenting with left inguinal lymphadenopathy, lymphedema and a left lower extremity deep venous thrombosis. Stage IIA disease. Complete remission with 6 cycles of CHOP chemotherapy. Relapse in October 2009 with extensive infiltration of tumor into the right iliopsoas muscle presenting as an erythematous nodular cutaneous mass in the right inguinal region. Disease limited to muscle on PET scan. He was re-induced into remission with ICE and then received BCNU, etoposide, Cytoxan conditioning followed by autologous  stem cell support at [facility] in January 2010.  2. History of weight loss-weight is stable. 3. Normochromic anemia. Hemoglobin is stable. 4. History of deep venous thrombosis related to vascular compression from initial lymphoma. 5. Coronary artery disease status post bypass surgery. Moderate to severe mitral regurgitation status post 2 vessel PTCI and stenting using drug-eluting stents and Angiomax in June of this year. 6. 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 7. Low testosterone currently on every 2 week testosterone injections. 8. Osteoporosis. He continues calcium and vitamin D.  Disposition-Mr. Hogg remains in clinical remission from the lymphoma. We will continue to follow with routine labs and office visits. CT scans for concerning signs and symptoms. He will return for a followup visit in 6 months. He will contact the office in the interim with any problems.  Plan reviewed with Dr. Cyndie Chime.  Lonna Cobb ANP/GNP-BC

## 2012-11-08 ENCOUNTER — Encounter: Payer: Self-pay | Admitting: Physician Assistant

## 2012-11-08 ENCOUNTER — Ambulatory Visit (INDEPENDENT_AMBULATORY_CARE_PROVIDER_SITE_OTHER): Payer: Self-pay | Admitting: Physician Assistant

## 2012-11-08 VITALS — BP 132/74 | HR 84 | Ht 70.0 in | Wt 140.0 lb

## 2012-11-08 DIAGNOSIS — I340 Nonrheumatic mitral (valve) insufficiency: Secondary | ICD-10-CM

## 2012-11-08 DIAGNOSIS — Z951 Presence of aortocoronary bypass graft: Secondary | ICD-10-CM

## 2012-11-08 DIAGNOSIS — I10 Essential (primary) hypertension: Secondary | ICD-10-CM

## 2012-11-08 DIAGNOSIS — I2510 Atherosclerotic heart disease of native coronary artery without angina pectoris: Secondary | ICD-10-CM

## 2012-11-08 DIAGNOSIS — I059 Rheumatic mitral valve disease, unspecified: Secondary | ICD-10-CM

## 2012-11-08 NOTE — Assessment & Plan Note (Signed)
Consider repeat 2D Echo in three months to reassess the mitral valve since he was just revascularized.

## 2012-11-08 NOTE — Patient Instructions (Signed)
Follow up with Dr. Allyson Sabal in 3 Months

## 2012-11-08 NOTE — Assessment & Plan Note (Signed)
Blood pressure doing well at this time.

## 2012-11-08 NOTE — Assessment & Plan Note (Addendum)
Current meds include: ASA, Effient, lopressor, crestor.  We discussed cardiac rehab.  The schedule will not allow him to attend, however, we discussed increasing activity level slowly and walking on a treadmill which he has access to .

## 2012-11-08 NOTE — Progress Notes (Signed)
Date:  11/08/2012   ID:  Willie [name], DOB 01/17/1941, MRN [id number]  PCP:  Londell Moh, MD  Primary Cardiologist:  Allyson Sabal     History of Present Illness: Willie [name] is a 72 y.o. male who presented to [facility] on 6/15 to undergo a planned left heart catheterization. He was admitted the day prior to his procedure for pre-cath hydration, due to stage III chronic renal insufficiency. The patient has a history significant for CAD, s/p CABG x 5 in 2007. He had a LIMA to the LAD, a SVG to the diagonal, a sequential vein graft to the first and second obtuse marginal, and a vein graft to the posterolateral artery. The patient has not had any recent anginal symptoms, however, he recently saw Dr. Allyson Sabal in clinic because his primary care physician picked up a murmur on exam. Subsequently, an echo was performed that demonstrated moderate to severe mitral regurgitation, which was changed from the findings of his prior echo in 2012, which demonstrated only mild MR. The mitral leaflets were noted to be thickened with systolic bowing. There was no evidence of prolapse. He was also noted to have grade 2 diastolic dysfunction and elevated left atrial filling pressure. EF is 55-60%.  He subsequently underwent a nuclear stress test on 09/08/12, which demonstrated an inferior scar with moderate peri-infarct ischemia, which was a new finding compared to his prior Myoview in 2012. Subsequent LHC revealed significant graft disease. He had a protected left main off of the circumflex coronary artery, which was 95% stenosed, and an occluded RCA vein graft supplying the dominant right with a high-grade mid stenosis. Dr. Allyson Sabal felt percutaneous revascularization would improve his severe mitral regurgitation. However, due to his renal function, and pre-cath serum creatinine of 1.59, it was decided to delay intervention and have the patient return at at later date for a staged procedure.  Patient underwent  successful 2 vessel PTCI stenting using drug-eluting stents and Angiomax.  He was discharged on aspirin and effient.   The patient presents today for follow up to his procedure.  He reports doing well and currently denies nausea, vomiting, fever, chest pain, shortness of breath, orthopnea, dizziness, PND, cough, congestion, abdominal pain, hematochezia, melena, lower extremity edema. No pain in the right groin cath site.  He is back at work doing Clinical biochemist. He is not having any problems with the number of stairs in the building.  Due to the Cardiac Rehab schedule, he will not be able to make it.  He and his wife live in [place].    Wt Readings from Last 3 Encounters:  11/08/12 140 lb (63.504 kg)  11/01/12 141 lb 9.6 oz (64.229 kg)  10/12/12 133 lb 9.6 oz (60.6 kg)     Past Medical History  Diagnosis Date  . Hypertension   . History of DVT of lower extremity     06/2001 as first sign of lymphoma: enlarged left inguinal nodes  . Low serum testosterone level 11/10/2011  . 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 Nocturia 11/10/2011  . Normochromic anemia 11/10/2011    Likely result of prior high dose chemotherapy  . Unexplained weight loss 05/03/2012  . Mitral valvular regurgitation 06/01/12    moderate to severe by echo- asymptomatic  . Dyslipidemia   . RBBB   . Coronary artery disease   . Family history of anesthesia complication     "son, Prudencio Burly, larynx collapses" (10/11/2012)  . Heart murmur   . History of  blood transfusion 2009-2010    "both related to his chemo" (10/11/2012)  . Chronic renal insufficiency, stage III (moderate)   . Non Hodgkin's lymphoma 2003; 2009    "chemo; stem cell transplant & high powered chemo" (10/10/2012)    Current Outpatient Prescriptions  Medication Sig Dispense Refill  . aspirin EC 81 MG tablet Take 81 mg by mouth every evening.      . Calcium Carbonate-Vitamin D (CALCIUM 500 + D PO) Take by mouth daily.      .  Chlorpheniramine-Acetaminophen (CORICIDIN HBP COLD/FLU PO) Take 2 tablets by mouth every 4 (four) hours as needed (for allergy symptoms).      . cholecalciferol (VITAMIN D) 1000 UNITS tablet Take 1,000 Units by mouth daily.       . fish oil-omega-3 fatty acids 1000 MG capsule Take 1 g by mouth daily.       . metoprolol tartrate (LOPRESSOR) 25 MG tablet Take 25 mg by mouth daily.       . Multiple Vitamin (MULTIVITAMIN WITH MINERALS) TABS Take 1 tablet by mouth daily.      . nitroGLYCERIN (NITROSTAT) 0.4 MG SL tablet Place 1 tablet (0.4 mg total) under the tongue every 5 (five) minutes as needed for chest pain.  25 tablet  2  . Potassium 99 MG TABS Take 1 tablet by mouth daily.      . prasugrel (EFFIENT) 10 MG TABS Take 1 tablet (10 mg total) by mouth daily.  30 tablet  10  . rosuvastatin (CRESTOR) 10 MG tablet Take 10 mg by mouth 2 (two) times a week. On Wednesday and Sunday      . testosterone cypionate (DEPOTESTOTERONE CYPIONATE) 200 MG/ML injection Inject 200 mg into the muscle every 14 (fourteen) days. Next injection due 09/15/12      . vitamin B-12 (CYANOCOBALAMIN) 500 MCG tablet Take 500 mcg by mouth daily.       No current facility-administered medications for this visit.    Allergies:    Allergies  Allergen Reactions  . Niaspan (Niacin Er)     Social History:  The patient  reports that he quit smoking about 43 years ago. His smoking use included Cigarettes. He has a 6.5 pack-year smoking history. He has never used smokeless tobacco. He reports that he does not drink alcohol or use illicit drugs.   Family history:   Family History  Problem Relation Age of Onset  . Cancer - Lung Father 33  . Leukemia Brother 21    ROS:  Please see the history of present illness.  All other systems reviewed and negative.   PHYSICAL EXAM: VS:  BP 132/74  Pulse 84  Ht 5\' 10"  (1.778 m)  Wt 140 lb (63.504 kg)  BMI 20.09 kg/m2 Well nourished, well developed, in no acute distress HEENT: Pupils are  equal round react to light accommodation extraocular movements are intact.  Neck: no JVDNo cervical lymphadenopathy. Cardiac: Regular rate and rhythm with 3/6 systolic MM  Lungs:  clear to auscultation bilaterally, no wheezing, rhonchi or rales Abd: soft, nontender, positive bowel sounds all quadrants, no hepatosplenomegaly Ext: no lower extremity edema.  2+ radial and dorsalis pedis pulses. Skin: warm and dry.  Right groin with a small hematoma.  Nontender.  No ecchymosis. Neuro:  Grossly normal, Strength 5/5 and equal.    ASSESSMENT AND PLAN:  Problem List Items Addressed This Visit   Severe mitral regurgitation     Consider repeat 2D Echo in three months to reassess the  mitral valve since he was just revascularized.    S/P CABG x 5  12/07 (Chronic)   Hypertension     Blood pressure doing well at this time.      CAD (coronary artery disease) - Primary     Current meds include: ASA, Effient, lopressor, crestor.  We discussed cardiac rehab.  The schedule will not allow him to attend, however, we discussed increasing activity level slowly and walking on a treadmill which he has access to .

## 2013-02-10 ENCOUNTER — Encounter: Payer: Self-pay | Admitting: Cardiovascular Disease

## 2013-02-10 ENCOUNTER — Ambulatory Visit (INDEPENDENT_AMBULATORY_CARE_PROVIDER_SITE_OTHER): Payer: Medicare Other | Admitting: Cardiovascular Disease

## 2013-02-10 VITALS — BP 140/82 | HR 84 | Ht 70.0 in | Wt 139.0 lb

## 2013-02-10 DIAGNOSIS — I340 Nonrheumatic mitral (valve) insufficiency: Secondary | ICD-10-CM

## 2013-02-10 DIAGNOSIS — I059 Rheumatic mitral valve disease, unspecified: Secondary | ICD-10-CM

## 2013-02-10 NOTE — Patient Instructions (Signed)
Your physician has requested that you have an echocardiogram. Echocardiography is a painless test that uses sound waves to create images of your heart. It provides your doctor with information about the size and shape of your heart and how well your heart's chambers and valves are working. This procedure takes approximately one hour. There are no restrictions for this procedure.   Your physician recommends that you schedule a follow-up appointment in: 6 months with a mid level extender and in 1 YEAR with Dr. Allyson Sabal.

## 2013-02-10 NOTE — Progress Notes (Signed)
02/10/2013 Willie [name]   1940-08-07  [id number]  Primary Physician Willie Moh, MD Primary Cardiologist: Willie Gess MD Willie [name]   HPI:  The patient is a 72y/o male, followed at [facility] by Dr. Allyson [name], who presented to [facility] on 6/15 to undergo a planned left heart catheterization. He was admitted the day prior to his procedure for pre-cath hydration, due to stage III chronic renal insufficiency. The patient has a history significant for CAD, s/p CABG x 5 in 2007. He had a LIMA to the LAD, a SVG to the diagonal, a sequential vein graft to the first and second obtuse marginal, and a vein graft to the posterolateral artery. The patient had not had any recent anginal symptoms, however, he recently saw Dr. Allyson [name] in clinic because his primary care physician picked up a murmur on exam. Subsequently, an echo was performed that demonstrated moderate to severe mitral regurgitation, which was changed from the findings of his prior echo in 2012, which demonstrated only mild MR. The mitral leaflets were noted to be thickened with systolic bowing. There was no evidence of prolapse. He was also noted to have grade 2 diastolic dysfunction and elevated left atrial filling pressure. He subsequently underwent a nuclear stress test on 09/08/12, which demonstrated an inferior scar with moderate peri-infarct ischemia, which was a new finding compared to his prior Myoview in 2012. Subsequent LHC revealed significant graft disease. He had a protected left main off of the circumflex coronary artery, which was 95% stenosed, and an occluded RCA vein graft supplying the dominant right with a high-grade mid stenosis. Dr. Allyson [name] felt percutaneous revascularization would improve his severe mitral regurgitation.I performed two-vessel intervention on 10/11/12 to his RCA and his protected left main. He has been asymptomatic since.   Current Outpatient Prescriptions  Medication Sig Dispense Refill  .  aspirin EC 81 MG tablet Take 81 mg by mouth every evening.      . Calcium Carbonate-Vitamin D (CALCIUM 500 + D PO) Take by mouth daily.      . Chlorpheniramine-Acetaminophen (CORICIDIN HBP COLD/FLU PO) Take 2 tablets by mouth every 4 (four) hours as needed (for allergy symptoms).      . cholecalciferol (VITAMIN D) 1000 UNITS tablet Take 1,000 Units by mouth daily.       . fish oil-omega-3 fatty acids 1000 MG capsule Take 1 g by mouth daily.       . metoprolol tartrate (LOPRESSOR) 25 MG tablet Take 25 mg by mouth daily.       . Multiple Vitamin (MULTIVITAMIN WITH MINERALS) TABS Take 1 tablet by mouth daily.      . nitroGLYCERIN (NITROSTAT) 0.4 MG SL tablet Place 1 tablet (0.4 mg total) under the tongue every 5 (five) minutes as needed for chest pain.  25 tablet  2  . Potassium 99 MG TABS Take 1 tablet by mouth daily.      . prasugrel (EFFIENT) 10 MG TABS Take 1 tablet (10 mg total) by mouth daily.  30 tablet  10  . rosuvastatin (CRESTOR) 10 MG tablet Take 10 mg by mouth 2 (two) times a week. On Wednesday and Sunday      . testosterone cypionate (DEPOTESTOTERONE CYPIONATE) 200 MG/ML injection Inject 200 mg into the muscle every 14 (fourteen) days. Next injection due 09/15/12      . vitamin B-12 (CYANOCOBALAMIN) 500 MCG tablet Take 500 mcg by mouth daily.       No current facility-administered medications for this visit.  Allergies  Allergen Reactions  . Niaspan [Niacin Er]     History   Social History  . Marital Status: Married    Spouse Name: N/A    Number of Children: N/A  . Years of Education: N/A   Occupational History  . Not on file.   Social History Main Topics  . Smoking status: Former Smoker -- 0.50 packs/day for 13 years    Types: Cigarettes    Quit date: 07/01/1969  . Smokeless tobacco: Never Used  . Alcohol Use: No  . Drug Use: No  . Sexual Activity: Not Currently   Other Topics Concern  . Not on file   Social History Narrative  . No narrative on file      Review of Systems: General: negative for chills, fever, night sweats or weight changes.  Cardiovascular: negative for chest pain, dyspnea on exertion, edema, orthopnea, palpitations, paroxysmal nocturnal dyspnea or shortness of breath Dermatological: negative for rash Respiratory: negative for cough or wheezing Urologic: negative for hematuria Abdominal: negative for nausea, vomiting, diarrhea, bright red blood per rectum, melena, or hematemesis Neurologic: negative for visual changes, syncope, or dizziness All other systems reviewed and are otherwise negative except as noted above.    Blood pressure 140/82, pulse 84, height 5\' 10"  (1.778 m), weight 139 lb (63.05 kg).  General appearance: alert and no distress Neck: no adenopathy, no JVD, supple, symmetrical, trachea midline, thyroid not enlarged, symmetric, no tenderness/mass/nodules and bilateral carotid bruits Lungs: clear to auscultation bilaterally Heart: 2/6 apical systolic murmur consistent with mitral regurgitation Extremities: extremities normal, atraumatic, no cyanosis or edema  EKG not performed today  ASSESSMENT AND PLAN:   Severe mitral regurgitation The patient had severe mitral regurgitation by 2-D echocardiography with normal LV size and function and elevated left facial filling pressures. I performed cardiac catheterization on him 10/11/12 to determine whether or not this was ischemically mediated. Alkaline performed intervention on his RCA and protected left main with drug-eluting stents. He is completely asymptomatic. A minute repeat a 2-D echo to determine whether or not the degree of mitral regurgitation has improved as a result of coronary intervention.      Willie Gess MD FACP,FACC,FAHA, [facility] 02/10/2013 5:15 PM

## 2013-02-10 NOTE — Assessment & Plan Note (Signed)
The patient had severe mitral regurgitation by 2-D echocardiography with normal LV size and function and elevated left facial filling pressures. I performed cardiac catheterization on him 10/11/12 to determine whether or not this was ischemically mediated. Alkaline performed intervention on his RCA and protected left main with drug-eluting stents. He is completely asymptomatic. A minute repeat a 2-D echo to determine whether or not the degree of mitral regurgitation has improved as a result of coronary intervention.

## 2013-02-23 ENCOUNTER — Other Ambulatory Visit: Payer: Self-pay

## 2013-03-01 ENCOUNTER — Ambulatory Visit (HOSPITAL_COMMUNITY)
Admission: RE | Admit: 2013-03-01 | Discharge: 2013-03-01 | Disposition: A | Discharge status: Home / Self Care | Payer: Medicare Other | Source: Ambulatory Visit | Attending: Cardiovascular Disease | Admitting: Cardiovascular Disease

## 2013-03-01 DIAGNOSIS — I2510 Atherosclerotic heart disease of native coronary artery without angina pectoris: Secondary | ICD-10-CM | POA: Insufficient documentation

## 2013-03-01 DIAGNOSIS — I340 Nonrheumatic mitral (valve) insufficiency: Secondary | ICD-10-CM

## 2013-03-01 DIAGNOSIS — I059 Rheumatic mitral valve disease, unspecified: Secondary | ICD-10-CM | POA: Insufficient documentation

## 2013-03-01 DIAGNOSIS — I079 Rheumatic tricuspid valve disease, unspecified: Secondary | ICD-10-CM | POA: Insufficient documentation

## 2013-03-01 DIAGNOSIS — I379 Nonrheumatic pulmonary valve disorder, unspecified: Secondary | ICD-10-CM | POA: Insufficient documentation

## 2013-03-01 NOTE — Progress Notes (Signed)
2D Echo Performed 03/01/2013    Willie [name], RCS  

## 2013-03-09 ENCOUNTER — Telehealth: Payer: Self-pay | Admitting: Cardiovascular Disease

## 2013-03-09 NOTE — Telephone Encounter (Signed)
Returning your call from Tuesday. °

## 2013-03-09 NOTE — Telephone Encounter (Signed)
lmom 

## 2013-03-10 ENCOUNTER — Telehealth: Payer: Self-pay | Admitting: *Deleted

## 2013-03-10 DIAGNOSIS — I341 Nonrheumatic mitral (valve) prolapse: Secondary | ICD-10-CM

## 2013-03-10 NOTE — Telephone Encounter (Signed)
Message copied by Marella Bile on Fri Mar 10, 2013  9:14 AM ------      Message from: Runell Gess      Created: Thu Mar 02, 2013  1:17 PM       Moderate mitral regurgitation with mitral valve prolapse, normal LV function. Repeat on an annual basis ------

## 2013-03-10 NOTE — Telephone Encounter (Signed)
Spoke with patient and gave echo results 

## 2013-03-10 NOTE — Telephone Encounter (Signed)
Returning your call from yesterday. °

## 2013-03-10 NOTE — Telephone Encounter (Signed)
lmom 

## 2013-03-10 NOTE — Telephone Encounter (Signed)
Order placed for repeat echocardiogram in 1 year 

## 2013-04-19 ENCOUNTER — Emergency Department
Admission: EM | Admit: 2013-04-19 | Discharge: 2013-04-19 | Disposition: A | Discharge status: Home / Self Care | Payer: Medicare Other | Source: Home / Self Care | Attending: Family Medicine | Admitting: Family Medicine

## 2013-04-19 ENCOUNTER — Encounter: Payer: Self-pay | Admitting: Emergency Medicine

## 2013-04-19 DIAGNOSIS — J069 Acute upper respiratory infection, unspecified: Secondary | ICD-10-CM

## 2013-04-19 DIAGNOSIS — R040 Epistaxis: Secondary | ICD-10-CM

## 2013-04-19 MED ORDER — AMOXICILLIN 875 MG PO TABS: 875.0000 mg | ORAL_TABLET | Freq: Two times a day (BID) | ORAL | Status: DC

## 2013-04-19 MED ORDER — BENZONATATE 200 MG PO CAPS: 200.0000 mg | ORAL_CAPSULE | Freq: Every day | ORAL | Status: DC

## 2013-04-19 NOTE — ED Notes (Signed)
Pt c/o cough and nasal congestion x 3 days with epistaxis x today. Denies fever.

## 2013-04-19 NOTE — ED Provider Notes (Signed)
CSN: [id number]     Arrival date & time 04/19/13  1553 History   First MD Initiated Contact with Patient 04/19/13 1650     Chief Complaint  Patient presents with  . Epistaxis  . Cough      HPI Comments: Patient reports that he has had mild URI symptoms for about 3 days.  He has had increased sinus congestion with intermittent right anterior nosebleed.  The history is provided by the patient and the spouse.    Past Medical History  Diagnosis Date  . Hypertension   . History of DVT of lower extremity     06/2001 as first sign of lymphoma: enlarged left inguinal nodes  . Low serum testosterone level 11/10/2011  . 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 Nocturia 11/10/2011  . Normochromic anemia 11/10/2011    Likely result of prior high dose chemotherapy  . Unexplained weight loss 05/03/2012  . Mitral valvular regurgitation 06/01/12    moderate to severe by echo- asymptomatic  . Dyslipidemia   . RBBB   . Coronary artery disease   . Family history of anesthesia complication     "son, Prudencio Burly, larynx collapses" (10/11/2012)  . Heart murmur   . History of blood transfusion 2009-2010    "both related to his chemo" (10/11/2012)  . Chronic renal insufficiency, stage III (moderate)   . Non Hodgkin's lymphoma 2003; 2009    "chemo; stem cell transplant & high powered chemo" (10/10/2012)   Past Surgical History  Procedure Laterality Date  . Bone marrow transplant  2010  . Cardiac catheterization  10/03/2012; 2007  . Coronary angioplasty with stent placement  10/11/2012    "2" (10/11/2012)  . Tonsillectomy  1950's  . Cataract extraction w/ intraocular lens implant Left 1990's  . Coronary artery bypass graft  03/29/2006    "CABG X5" (10/11/2012)   Family History  Problem Relation Age of Onset  . Heart failure Father   . Leukemia Brother 21  . Cancer - Lung Mother    History  Substance Use Topics  . Smoking status: Former Smoker -- 0.50 packs/day for 13 years    Types: Cigarettes     Quit date: 07/01/1969  . Smokeless tobacco: Never Used  . Alcohol Use: No    Review of Systems + sore throat + cough No pleuritic pain No wheezing + nasal congestion + recurring nosebleed + post-nasal drainage No sinus pain/pressure No itchy/red eyes ? earache No hemoptysis No SOB No fever/chills No nausea No vomiting No abdominal pain No diarrhea No urinary symptoms No skin rash + fatigue No myalgias No headache Used OTC meds without relief  Allergies  Niaspan  Home Medications   Current Outpatient Rx  Name  Route  Sig  Dispense  Refill  . aspirin EC 81 MG tablet   Oral   Take 81 mg by mouth every evening.         . benzonatate (TESSALON) 200 MG capsule   Oral   Take 1 capsule (200 mg total) by mouth at bedtime. Take as needed for cough   12 capsule   0   . Calcium Carbonate-Vitamin D (CALCIUM 500 + D PO)   Oral   Take by mouth daily.         . Chlorpheniramine-Acetaminophen (CORICIDIN HBP COLD/FLU PO)   Oral   Take 2 tablets by mouth every 4 (four) hours as needed (for allergy symptoms).         . cholecalciferol (VITAMIN D)  1000 UNITS tablet   Oral   Take 1,000 Units by mouth daily.          . fish oil-omega-3 fatty acids 1000 MG capsule   Oral   Take 1 g by mouth daily.          . metoprolol tartrate (LOPRESSOR) 25 MG tablet   Oral   Take 25 mg by mouth daily.          . Multiple Vitamin (MULTIVITAMIN WITH MINERALS) TABS   Oral   Take 1 tablet by mouth daily.         . nitroGLYCERIN (NITROSTAT) 0.4 MG SL tablet   Sublingual   Place 1 tablet (0.4 mg total) under the tongue every 5 (five) minutes as needed for chest pain.   25 tablet   2   . Potassium 99 MG TABS   Oral   Take 1 tablet by mouth daily.         . prasugrel (EFFIENT) 10 MG TABS   Oral   Take 1 tablet (10 mg total) by mouth daily.   30 tablet   10   . rosuvastatin (CRESTOR) 10 MG tablet   Oral   Take 10 mg by mouth 2 (two) times a week. On  Wednesday and Sunday         . testosterone cypionate (DEPOTESTOTERONE CYPIONATE) 200 MG/ML injection   Intramuscular   Inject 200 mg into the muscle every 14 (fourteen) days. Next injection due 09/15/12         . vitamin B-12 (CYANOCOBALAMIN) 500 MCG tablet   Oral   Take 500 mcg by mouth daily.          BP 144/85  Pulse 110  Temp(Src) 98.1 F (36.7 C) (Oral)  Resp 18  Ht 5\' 9"  (1.753 m)  Wt 133 lb (60.328 kg)  BMI 19.63 kg/m2  SpO2 100% Physical Exam Nursing notes and Vital Signs reviewed. Appearance:  Patient appears healthy, stated age, and in no acute distress Eyes:  Pupils are equal, round, and reactive to light and accomodation.  Extraocular movement is intact.  Conjunctivae are not inflamed  Ears:  Canals normal.  Tympanic membranes normal.  Nose:   Congested turbinates, worse on right.  No sinus tenderness.  There is a small amount of blood visible in right anterior nares but no active bleeding.  Pharynx:  Normal Neck:  Supple.  Slightly tender shotty posterior nodes are palpated bilaterally  Lungs:  Clear to auscultation.  Breath sounds are equal.  Heart:  Regular rate and rhythm without murmurs, rubs, or gallops.  Abdomen:  Nontender without masses or hepatosplenomegaly.  Bowel sounds are present.  No CVA or flank tenderness.  Extremities:  No edema.  No calf tenderness Skin:  No rash present.   ED Course  Procedures  none         MDM   1. Acute upper respiratory infections of unspecified site; suspect viral URI   2. Epistaxis    There is no evidence of bacterial infection today.  Treat symptomatically for now:  Prescription written for Benzonatate [facility]) to take at bedtime for night-time cough.  Take plain Mucinex (1200 mg guaifenesin) twice daily for cough and congestion.  Increase fluid intake, rest. May use Afrin nasal spray (or generic oxymetazoline) twice daily for about 5 days.  Also recommend using saline nasal spray several times  daily. Try warm salt water gargles for sore throat.  When nosebleed occurs, apply pressure to  nose for about 10 to 15 minutes. Stop all antihistamines for now, and other non-prescription cough/cold preparations. Begin Amoxicillin if not improving about 5 days or if persistent fever develops (Given a prescription to hold, with an expiration date)  Follow-up with family doctor if not improving 7 to 10 days    Lattie Haw, MD 04/19/13 [phone]

## 2013-04-22 ENCOUNTER — Telehealth: Payer: Self-pay | Admitting: Emergency Medicine

## 2013-04-28 ENCOUNTER — Encounter: Payer: Self-pay | Admitting: Emergency Medicine

## 2013-04-28 ENCOUNTER — Emergency Department
Admission: EM | Admit: 2013-04-28 | Discharge: 2013-04-28 | Disposition: A | Discharge status: Home / Self Care | Payer: Medicare Other | Source: Home / Self Care | Attending: Family Medicine | Admitting: Family Medicine

## 2013-04-28 DIAGNOSIS — J069 Acute upper respiratory infection, unspecified: Secondary | ICD-10-CM

## 2013-04-28 DIAGNOSIS — R040 Epistaxis: Secondary | ICD-10-CM

## 2013-04-28 NOTE — Discharge Instructions (Signed)
Finish amoxicillin.  May gently apply Bacitracin ointment to nares twice daily for several days, then use vaseline 2 or 3 times daily.  Continue saline spray several times daily. Recommend followup with ENT

## 2013-04-28 NOTE — ED Notes (Signed)
Started this am about 5:00, right nostri

## 2013-04-28 NOTE — ED Provider Notes (Signed)
CSN: [id number]     Arrival date & time 04/28/13  7106 History   First MD Initiated Contact with Patient 04/28/13 [phone]     Chief Complaint  Patient presents with  . Epistaxis     HPI Comments: Patient returns for follow-up reporting that he continues to have recurring nosebleeds primarily from right nostril.  He had a nosebleed at 5:30AM today which he finally controlled with pressure.  He reports that his cold has almost resolved and he has only a mild residual cough.  No fevers, chills, and sweats.  No facial pain.  The history is provided by the patient and the spouse.    Past Medical History  Diagnosis Date  . Hypertension   . History of DVT of lower extremity     06/2001 as first sign of lymphoma: enlarged left inguinal nodes  . Low serum testosterone level 11/10/2011  . 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 Nocturia 11/10/2011  . Normochromic anemia 11/10/2011    Likely result of prior high dose chemotherapy  . Unexplained weight loss 05/03/2012  . Mitral valvular regurgitation 06/01/12    moderate to severe by echo- asymptomatic  . Dyslipidemia   . RBBB   . Coronary artery disease   . Family history of anesthesia complication     "son, Margreta Journey, larynx collapses" (10/11/2012)  . Heart murmur   . History of blood transfusion 2009-2010    "both related to his chemo" (10/11/2012)  . Chronic renal insufficiency, stage III (moderate)   . Non Hodgkin's lymphoma 2003; 2009    "chemo; stem cell transplant & high powered chemo" (10/10/2012)   Past Surgical History  Procedure Laterality Date  . Bone marrow transplant  2010  . Cardiac catheterization  10/03/2012; 2007  . Coronary angioplasty with stent placement  10/11/2012    "2" (10/11/2012)  . Tonsillectomy  1950's  . Cataract extraction w/ intraocular lens implant Left 1990's  . Coronary artery bypass graft  03/29/2006    "CABG X5" (10/11/2012)   Family History  Problem Relation Age of Onset  . Heart failure Father   .  Leukemia Brother 21  . Cancer - Lung Mother    History  Substance Use Topics  . Smoking status: Former Smoker -- 0.50 packs/day for 13 years    Types: Cigarettes    Quit date: 07/01/1969  . Smokeless tobacco: Never Used  . Alcohol Use: No    Review of Systems No sore throat + cough, minimal No pleuritic pain No wheezing + nasal congestion + post-nasal drainage + nosebleeds No sinus pain/pressure No itchy/red eyes No earache No hemoptysis No SOB No fever/chills No nausea No vomiting No abdominal pain No diarrhea No urinary symptoms No skin rash No fatigue No myalgias No headache    Allergies  Niaspan  Home Medications   Current Outpatient Rx  Name  Route  Sig  Dispense  Refill  . amoxicillin (AMOXIL) 875 MG tablet   Oral   Take 1 tablet (875 mg total) by mouth 2 (two) times daily. (Rx void after 04/27/13)   20 tablet   0   . aspirin EC 81 MG tablet   Oral   Take 81 mg by mouth every evening.         . benzonatate (TESSALON) 200 MG capsule   Oral   Take 1 capsule (200 mg total) by mouth at bedtime. Take as needed for cough   12 capsule   0   . Calcium  Carbonate-Vitamin D (CALCIUM 500 + D PO)   Oral   Take by mouth daily.         . Chlorpheniramine-Acetaminophen (CORICIDIN HBP COLD/FLU PO)   Oral   Take 2 tablets by mouth every 4 (four) hours as needed (for allergy symptoms).         . cholecalciferol (VITAMIN D) 1000 UNITS tablet   Oral   Take 1,000 Units by mouth daily.          . fish oil-omega-3 fatty acids 1000 MG capsule   Oral   Take 1 g by mouth daily.          . metoprolol tartrate (LOPRESSOR) 25 MG tablet   Oral   Take 25 mg by mouth daily.          . Multiple Vitamin (MULTIVITAMIN WITH MINERALS) TABS   Oral   Take 1 tablet by mouth daily.         . nitroGLYCERIN (NITROSTAT) 0.4 MG SL tablet   Sublingual   Place 1 tablet (0.4 mg total) under the tongue every 5 (five) minutes as needed for chest pain.   25  tablet   2   . Potassium 99 MG TABS   Oral   Take 1 tablet by mouth daily.         . prasugrel (EFFIENT) 10 MG TABS   Oral   Take 1 tablet (10 mg total) by mouth daily.   30 tablet   10   . rosuvastatin (CRESTOR) 10 MG tablet   Oral   Take 10 mg by mouth 2 (two) times a week. On Wednesday and Sunday         . testosterone cypionate (DEPOTESTOTERONE CYPIONATE) 200 MG/ML injection   Intramuscular   Inject 200 mg into the muscle every 14 (fourteen) days. Next injection due 09/15/12         . vitamin B-12 (CYANOCOBALAMIN) 500 MCG tablet   Oral   Take 500 mcg by mouth daily.          BP 151/90  Pulse 83  Temp(Src) 97.5 F (36.4 C) (Oral)  Ht 5\' 8"  (1.727 m)  Wt 134 lb (60.782 kg)  BMI 20.38 kg/m2  SpO2 100% Physical Exam Nursing notes and Vital Signs reviewed. Appearance:  Patient appears healthy, stated age, and in no acute distress Eyes:  Pupils are equal, round, and reactive to light and accomodation.  Extraocular movement is intact.  Conjunctivae are not inflamed  Ears:  Canals normal.  Tympanic membranes normal.  Nose:  Congested turbinates.  No sinus tenderness.  No active epistaxis present.  Right septum has thin coating of blood present. Pharynx:  Normal Neck:  Supple.  No adenopathy Lungs:  Clear to auscultation.  Breath sounds are equal.  Heart:  Regular rate and rhythm without murmurs, rubs, or gallops.  Skin:  No rash present.   ED Course  Procedures  Epistaxis evaluation:  Inserted cotton pledget soaked with LET into right nares and left in place for 15 minutes.  After removal, no epistaxis present and no bleeding points identified although right septum appears diffusely friable.  Applied thin film of Bacitracin to septum using a Bacitracin coated cotton pledget.       MDM   1. Epistaxis; resolved  2. Acute upper respiratory infections of unspecified site; resolving    Finish amoxicillin.  May gently apply Bacitracin ointment to nares twice  daily for several days, then use vaseline 2 or 3 times  daily.  Continue saline spray several times daily. Patient is at risk for recurrent epistaxis; recommend followup with ENT    Kandra Nicolas, MD 04/28/13 [phone]

## 2013-05-02 ENCOUNTER — Other Ambulatory Visit: Payer: Medicare Other | Admitting: Lab

## 2013-05-02 ENCOUNTER — Ambulatory Visit: Payer: Medicare Other | Admitting: Oncology

## 2013-05-09 ENCOUNTER — Telehealth: Payer: Self-pay | Admitting: Oncology

## 2013-05-09 ENCOUNTER — Telehealth: Payer: Self-pay | Admitting: Hematology and Oncology

## 2013-05-09 ENCOUNTER — Other Ambulatory Visit (HOSPITAL_BASED_OUTPATIENT_CLINIC_OR_DEPARTMENT_OTHER): Payer: Medicare Other

## 2013-05-09 ENCOUNTER — Ambulatory Visit (HOSPITAL_BASED_OUTPATIENT_CLINIC_OR_DEPARTMENT_OTHER): Payer: Medicare Other | Admitting: Oncology

## 2013-05-09 VITALS — BP 140/69 | HR 93 | Temp 97.8°F | Resp 19 | Ht 68.0 in | Wt 136.2 lb

## 2013-05-09 DIAGNOSIS — R634 Abnormal weight loss: Secondary | ICD-10-CM

## 2013-05-09 DIAGNOSIS — IMO0002 Reserved for concepts with insufficient information to code with codable children: Secondary | ICD-10-CM

## 2013-05-09 DIAGNOSIS — I059 Rheumatic mitral valve disease, unspecified: Secondary | ICD-10-CM

## 2013-05-09 DIAGNOSIS — N189 Chronic kidney disease, unspecified: Secondary | ICD-10-CM

## 2013-05-09 DIAGNOSIS — C8445 Peripheral T-cell lymphoma, not classified, lymph nodes of inguinal region and lower limb: Secondary | ICD-10-CM

## 2013-05-09 LAB — CBC WITH DIFFERENTIAL/PLATELET
BASO%: 0.5 % (ref 0.0–2.0)
Basophils Absolute: 0 10*3/uL (ref 0.0–0.1)
EOS ABS: 0.1 10*3/uL (ref 0.0–0.5)
EOS%: 1.7 % (ref 0.0–7.0)
HEMATOCRIT: 30.5 % — AB (ref 38.4–49.9)
HGB: 10.1 g/dL — ABNORMAL LOW (ref 13.0–17.1)
LYMPH#: 1.3 10*3/uL (ref 0.9–3.3)
LYMPH%: 17.2 % (ref 14.0–49.0)
MCH: 32.8 pg (ref 27.2–33.4)
MCHC: 33.3 g/dL (ref 32.0–36.0)
MCV: 98.6 fL — ABNORMAL HIGH (ref 79.3–98.0)
MONO#: 1 10*3/uL — AB (ref 0.1–0.9)
MONO%: 12.8 % (ref 0.0–14.0)
NEUT%: 67.8 % (ref 39.0–75.0)
NEUTROS ABS: 5.2 10*3/uL (ref 1.5–6.5)
PLATELETS: 229 10*3/uL (ref 140–400)
RBC: 3.09 10*6/uL — AB (ref 4.20–5.82)
RDW: 13.4 % (ref 11.0–14.6)
WBC: 7.7 10*3/uL (ref 4.0–10.3)

## 2013-05-09 LAB — COMPREHENSIVE METABOLIC PANEL (CC13)
ALBUMIN: 3.4 g/dL — AB (ref 3.5–5.0)
ALT: 19 U/L (ref 0–55)
ANION GAP: 7 meq/L (ref 3–11)
AST: 22 U/L (ref 5–34)
Alkaline Phosphatase: 83 U/L (ref 40–150)
BILIRUBIN TOTAL: 0.46 mg/dL (ref 0.20–1.20)
BUN: 14.5 mg/dL (ref 7.0–26.0)
CALCIUM: 9.1 mg/dL (ref 8.4–10.4)
CHLORIDE: 101 meq/L (ref 98–109)
CO2: 26 meq/L (ref 22–29)
Creatinine: 1.5 mg/dL — ABNORMAL HIGH (ref 0.7–1.3)
GLUCOSE: 82 mg/dL (ref 70–140)
Potassium: 4.2 mEq/L (ref 3.5–5.1)
SODIUM: 135 meq/L — AB (ref 136–145)
TOTAL PROTEIN: 6.1 g/dL — AB (ref 6.4–8.3)

## 2013-05-09 LAB — LACTATE DEHYDROGENASE (CC13): LDH: 174 U/L (ref 125–245)

## 2013-05-09 NOTE — Progress Notes (Signed)
Hematology and Oncology Follow Up Visit  Willie [name] [id number] June 16, 1940 73 y.o. 05/09/2013 4:24 PM   Principle Diagnosis: Encounter Diagnoses  Name Primary?  . 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 He had an initial complete response to 6 cycles of CHOP chemotherapy. He relapsed October 2009 with extensive infiltration of tumor into the right iliopsoas muscle presenting as an erythematous nodular cutaneous mass in the right inguinal region. No other disease outside of the muscle noted on PET scan. He again achieved remission with ICE chemotherapy and then went onto receive BCNU, etoposide and Cytoxan conditioning followed by autologous stem cell transplant at [facility] in January 2010. He has achieved  a durable remission and is now 5 years from the transplant dose chemotherapy.  He has had a number of cardiac issues since his last visit. He had an abnormal stress test. He underwent a cardiac catheterization and placement of a right coronary artery and left main coronary artery drug eluting stent on 10/11/2012. He is now on aspirin and effient. He was referred back to cardiology in November 2014 to evaluate a mitral regurgitation heart murmur heard by his primary care physician. Echocardiogram done 03/01/2013 shows severe mitral regurgitation. His cardiologist discussed possibility of having a mitral ring in place in the near future. Is not having any chest pain, pressure, or dyspnea.  He continues to have unexplained weight loss. Compared to would've 153 pounds in January 2013, weight fell as low as 133 pounds by 03/22/2013. Weight today is 136 pounds. Appetite is good but he only is able to the  small meals. Most recent colonoscopy done in 2012 reported to him as negative. He had thyroid functions checked by his internist a few weeks ago and they were reported to him as normal. He has had a 1 g drop in his hemoglobin from his baseline of 11 g to today's value of 10 g.  He has been getting [facility] with scan so we have not had any CT scans in our system since July 2013.    Medications: reviewed  Allergies:  Allergies  Allergen Reactions  . Niaspan [Niacin Er]     Review of Systems: Hematology: No swollen glands, no bleeding or bruising ENT ROS: No sore throat Breast ROS Respiratory ROS: No cough or dyspnea Cardiovascular ROS:   See above Gastrointestinal ROS:  No abdominal pain or change in bowel habit Genito-Urinary ROS: Not questioned Musculoskeletal ROS: No muscle or bone pain Neurological ROS: No headache or change in vision Dermatological ROS: No rash Remaining ROS negative.  Physical Exam: Blood pressure 140/69, pulse 93, temperature 97.8 F (36.6 C), temperature source Oral, resp. rate 19, height $RemoveBe'5\' 8"'qIdHiNQHQ$  (1.727 m), weight 136 lb 3.2 oz (61.78 kg). Wt Readings from Last 3 Encounters:  05/09/13 136 lb 3.2 oz (61.78 kg)  04/28/13 134 lb (60.782 kg)  04/19/13 133 lb (60.328 kg)     General appearance: Pale, cachectic, Caucasian man HENNT: Pharynx no erythema, exudate, mass, or ulcer. No thyromegaly or thyroid nodules Lymph nodes: No cervical, supra-clavicular, axillary, or inguinal adenopathy Breasts:  Lungs: Clear to auscultation, resonant to percussion throughout Heart: Regular rhythm,  2/6 systolic murmur at the apex , no  gallop, no rub, no click, no edema Abdomen: Soft, nontender, normal bowel sounds, no mass, no organomegaly Extremities: No edema, no calf tenderness Musculoskeletal: no joint deformities GU:  Vascular: Carotid pulses 2+, no bruits,  Neurologic: Alert, oriented, PERRLA, , cranial nerves grossly normal, motor  strength 5 over 5, reflexes 1+ symmetric, upper body coordination normal, gait normal, Skin: No rash or ecchymosis  Lab Results: CBC W/Diff    Component Value Date/Time   WBC 7.7 05/09/2013 1453   WBC 8.0 10/12/2012 0521   RBC 3.09* 05/09/2013 1453   RBC 3.68* 10/12/2012 0521   HGB 10.1* 05/09/2013 1453   HGB 11.9* 10/12/2012 0521   HCT 30.5* 05/09/2013 1453   HCT 35.3* 10/12/2012 0521   PLT 229 05/09/2013 1453   PLT 162 10/12/2012 0521   MCV 98.6* 05/09/2013 1453   MCV 95.9 10/12/2012 0521   MCH 32.8 05/09/2013 1453   MCH 32.3 10/12/2012 0521   MCHC 33.3 05/09/2013 1453   MCHC 33.7 10/12/2012 0521   RDW 13.4 05/09/2013 1453   RDW 13.1 10/12/2012 0521   LYMPHSABS 1.3 05/09/2013 1453   LYMPHSABS 0.8 01/24/2008 1842   MONOABS 1.0* 05/09/2013 1453   MONOABS 0.8 01/24/2008 1842   EOSABS 0.1 05/09/2013 1453   EOSABS 0.2 01/24/2008 1842   BASOSABS 0.0 05/09/2013 1453   BASOSABS 0.0 01/24/2008 1842     Chemistry      Component Value Date/Time   NA 135* 05/09/2013 1454   NA 137 10/12/2012 0521   NA 143 04/29/2011 1201   K 4.2 05/09/2013 1454   K 4.1 10/12/2012 0521   K 4.2 04/29/2011 1201   CL 105 10/12/2012 0521   CL 106 05/03/2012 1149   CL 102 04/29/2011 1201   CO2 26 05/09/2013 1454   CO2 26 10/12/2012 0521   CO2 29 04/29/2011 1201   BUN 14.5 05/09/2013 1454   BUN 15 10/12/2012 0521   BUN 15 04/29/2011 1201   CREATININE 1.5* 05/09/2013 1454   CREATININE 1.51* 10/12/2012 0521   CREATININE 1.3* 04/29/2011 1201      Component Value Date/Time   CALCIUM 9.1 05/09/2013 1454   CALCIUM 9.0 10/12/2012 0521   CALCIUM 9.2 04/29/2011 1201   ALKPHOS 83 05/09/2013 1454   ALKPHOS 82 10/03/2012 0524   ALKPHOS 111* 04/29/2011 1201   AST 22 05/09/2013 1454   AST 21 10/03/2012 0524   AST 30 04/29/2011 1201   ALT 19 05/09/2013 1454   ALT 21 10/03/2012 0524   ALT 27 04/29/2011 1201   BILITOT 0.46 05/09/2013 1454   BILITOT 0.3 10/03/2012 0524   BILITOT 0.60 04/29/2011 1201        Impression:  #1. 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  No obvious signs of recurrence although his progressive weight loss remains a concern. He is due to be seen at [facility] next month but I don't want to wait to get CT scans. I will go ahead and order scans at this time.   #2. Progressive weight loss.  See discussion above.  #3. Chronic renal insufficiency. Creatinine is ranging between 1.4 and 1.7.  This accounts for some of his anemia but hemoglobin was over 11 with the same and creatinine until recently.  #4. Coronary artery disease status post 5 vessel bypass surgery December 2007  Now 7 months status post drug eluting stents to the right coronary and left main coronary arteries. #5. Valvular heart disease-mitral regurgitation  Consideration for mitral valvuloplasty as  discussed above.   #6. Chronic anemia.  Some component due to his renal insufficiency, however, Hemoglobin has fallen from his baseline and remains a concern.  #8. Remote left lower extremity DVT as presenting sign of his lymphoma secondary to left inguinal lymphadenopathy.    CC: Patient Care Team: Horatio Pel, MD as PCP - General (Internal Medicine)   Annia Belt, MD 1/20/20154:24 PM

## 2013-05-09 NOTE — Telephone Encounter (Signed)
gv and printed appt sched an [facility] forpt for Jan, Feb and May

## 2013-05-09 NOTE — Telephone Encounter (Signed)
s.w. Dr. Darnell Level and he said ok to sched with Dr. Alvy Bimler due to pt request.

## 2013-05-12 ENCOUNTER — Ambulatory Visit (HOSPITAL_COMMUNITY)
Admission: RE | Admit: 2013-05-12 | Discharge: 2013-05-12 | Disposition: A | Discharge status: Home / Self Care | Payer: Medicare Other | Source: Ambulatory Visit | Attending: Oncology | Admitting: Oncology

## 2013-05-12 ENCOUNTER — Encounter (HOSPITAL_COMMUNITY): Payer: Self-pay

## 2013-05-12 DIAGNOSIS — N189 Chronic kidney disease, unspecified: Secondary | ICD-10-CM | POA: Insufficient documentation

## 2013-05-12 DIAGNOSIS — R634 Abnormal weight loss: Secondary | ICD-10-CM

## 2013-05-12 DIAGNOSIS — Z9484 Stem cells transplant status: Secondary | ICD-10-CM | POA: Insufficient documentation

## 2013-05-12 DIAGNOSIS — R918 Other nonspecific abnormal finding of lung field: Secondary | ICD-10-CM | POA: Insufficient documentation

## 2013-05-12 DIAGNOSIS — C8445 Peripheral T-cell lymphoma, not classified, lymph nodes of inguinal region and lower limb: Secondary | ICD-10-CM | POA: Insufficient documentation

## 2013-05-12 HISTORY — DX: Disorder of kidney and ureter, unspecified: N28.9

## 2013-05-15 ENCOUNTER — Telehealth: Payer: Self-pay | Admitting: *Deleted

## 2013-05-15 NOTE — Telephone Encounter (Signed)
Talked with pt's wife & good report of CT scan given per Dr Azucena Freed instructions.

## 2013-05-15 NOTE — Telephone Encounter (Signed)
Message copied by Jesse Fall on Mon May 15, 2013  4:15 PM ------      Message from: Annia Belt      Created: Fri May 12, 2013  6:52 PM       Call pt: CT scans show no sign of recurrent lymphoma ------

## 2013-05-23 ENCOUNTER — Ambulatory Visit (HOSPITAL_BASED_OUTPATIENT_CLINIC_OR_DEPARTMENT_OTHER): Payer: Medicare Other | Admitting: Nurse Practitioner

## 2013-05-23 VITALS — BP 116/79 | HR 65 | Temp 97.8°F | Resp 18 | Ht 68.0 in | Wt 132.6 lb

## 2013-05-23 DIAGNOSIS — R634 Abnormal weight loss: Secondary | ICD-10-CM

## 2013-05-23 DIAGNOSIS — R21 Rash and other nonspecific skin eruption: Secondary | ICD-10-CM

## 2013-05-23 DIAGNOSIS — IMO0002 Reserved for concepts with insufficient information to code with codable children: Secondary | ICD-10-CM

## 2013-05-23 DIAGNOSIS — D649 Anemia, unspecified: Secondary | ICD-10-CM

## 2013-05-23 DIAGNOSIS — C8445 Peripheral T-cell lymphoma, not classified, lymph nodes of inguinal region and lower limb: Secondary | ICD-10-CM

## 2013-05-23 DIAGNOSIS — Z86718 Personal history of other venous thrombosis and embolism: Secondary | ICD-10-CM

## 2013-05-23 NOTE — Progress Notes (Addendum)
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 He had an initial complete response to 6 cycles of CHOP chemotherapy. He relapsed October 2009 with extensive infiltration of tumor into the right iliopsoas muscle presenting as an erythematous nodular cutaneous mass in the right inguinal region. No other disease outside of the muscle noted on PET scan. He again achieved remission with ICE chemotherapy and then went onto receive BCNU, etoposide and Cytoxan conditioning followed by autologous stem cell transplant at [facility] in January 2010.  At the time of his last visit with Dr. Beryle Beams on 05/09/2013 he was noted to have progressive weight loss. CT scans of the chest, abdomen and pelvis on 05/12/2013 showed no evidence of lymphoma recurrence. No cutaneous lesions were evident. Several right inguinal lymph nodes measured slightly larger.  He continues to feel well. He reports a good appetite. No fevers or sweats. Other than a "head cold" no interim illnesses or infections. No shortness of breath. He has a slight residual cough from the recent cold. No bowel or bladder problems. His wife has noticed a rash on his chest and back over the past several weeks. The rash is pruritic. She applied some hydrocortisone cream with improvement.     Objective: Filed Vitals:   05/23/13 1141  BP: 116/79  Pulse: 65  Temp: 97.8 F (36.6 C)  Resp: 18   Oropharynx is without thrush or ulceration. No palpable cervical or subclavicular lymph nodes. Lungs are clear. Regular cardiac rhythm. Abdomen is soft and nontender. No organomegaly. Somewhat firm fullness at the right lower abdomen superior to the inguinal region. No discrete mass. No palpable inguinal lymph nodes. Round, flat, erythematous 1-1/2 cm skin  lesion at the right pubic region. No leg edema. Scattered small erythematous skin lesions over the trunk some with a pustular appearance, others scabbed. The lesions do not have a vesicular appearance.   Lab Results: Lab Results  Component Value Date   WBC 7.7 05/09/2013   HGB 10.1* 05/09/2013   HCT 30.5* 05/09/2013   MCV 98.6* 05/09/2013   PLT 229 05/09/2013   NEUTROABS 5.2 05/09/2013    Chemistry:    Chemistry      Component Value Date/Time   NA 135* 05/09/2013 1454   NA 137 10/12/2012 0521   NA 143 04/29/2011 1201   K 4.2 05/09/2013 1454   K 4.1 10/12/2012 0521   K 4.2 04/29/2011 1201   CL 105 10/12/2012 0521   CL 106 05/03/2012 1149   CL 102 04/29/2011 1201   CO2 26 05/09/2013 1454   CO2 26 10/12/2012 0521   CO2 29 04/29/2011 1201   BUN 14.5 05/09/2013 1454   BUN 15 10/12/2012 0521   BUN 15 04/29/2011 1201   CREATININE 1.5* 05/09/2013 1454   CREATININE 1.51* 10/12/2012 0521   CREATININE 1.3* 04/29/2011 1201      Component Value Date/Time   CALCIUM 9.1 05/09/2013 1454   CALCIUM 9.0 10/12/2012 0521   CALCIUM 9.2 04/29/2011 1201   ALKPHOS 83 05/09/2013 1454   ALKPHOS 82 10/03/2012 0524   ALKPHOS 111* 04/29/2011 1201   AST 22 05/09/2013 1454   AST 21 10/03/2012 0524   AST 30 04/29/2011 1201   ALT 19 05/09/2013 1454   ALT 21 10/03/2012 0524   ALT 27 04/29/2011 1201   BILITOT 0.46 05/09/2013  1454   BILITOT 0.3 10/03/2012 0524   BILITOT 0.60 04/29/2011 1201       Studies/Results: Ct Abdomen Pelvis Wo Contrast  05/12/2013   CLINICAL DATA:  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 Recurrence in 2009 the right leg muscle. Stem cell transplant 2010. New unexplained weight loss. Chronic renal insufficiency  EXAM: CT CHEST, ABDOMEN AND PELVIS WITHOUT CONTRAST  TECHNIQUE: Multidetector CT imaging of the chest, abdomen and pelvis was performed following the standard protocol without IV contrast.  COMPARISON:  CT 11/02/2011  FINDINGS: CT CHEST FINDINGS  There is no axillary or supraclavicular  lymphadenopathy. No mediastinal hilar lymphadenopathy. No pericardial fluid.  Review of the lung parenchyma demonstrates mild peripheral nodularity in the upper lobes suggestive an infectious or inflammatory process. This is slightly progressed compared to prior. These nodules are irregular and in a bronchovascular distribution / interstitial distribution. 6 mm nodule in the right upper lobe (image 23) compares to 5 mm on prior. The enlargement is more appreciated by visual observation of increased thickness rather than measurable disease.  CT ABDOMEN AND PELVIS FINDINGS  No focal hepatic lesion on this noncontrast exam. The low-density lesion in the right hepatic lobe likely representing a benign cyst and is unchanged. The pancreas, spleen, adrenal glands, and kidneys are normal.  The stomach, small bowel and colon are normal.  Aorta is normal caliber. No retroperitoneal periportal lymphadenopathy. No free fluid the pelvis. No pelvic lymphadenopathy. No aggressive osseous lesion.  There is no cutaneous lesions evident. Several right inguinal lymph nodes measures slightly larger. For example 13 mm lymph node (image 130, series 2) compares to 9 mm on prior. 10 mm right inguinal node (image 122) compares to 7 mm on prior.  IMPRESSION: 1. No evidence of lymphoma recurrence within the thorax. 2. Small branching peripheral nodules in the right upper lobe are increased compared to prior. Favor infectious or inflammatory nodules. 3. No evidence of lymphoma recurrence within the abdomen or pelvis. 4. Increased size of small right inguinal lymph nodes is nonspecific. Patient may benefit from FDG PET/CT scan for localizing potential recurrent lymphoma .   Electronically Signed   By: Suzy Bouchard M.D.   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 Recurrence in 2009 the right leg muscle. Stem cell transplant 2010. New unexplained  weight loss. Chronic renal insufficiency  EXAM: CT CHEST, ABDOMEN AND PELVIS WITHOUT CONTRAST  TECHNIQUE: Multidetector CT imaging of the chest, abdomen and pelvis was performed following the standard protocol without IV contrast.  COMPARISON:  CT 11/02/2011  FINDINGS: CT CHEST FINDINGS  There is no axillary or supraclavicular lymphadenopathy. No mediastinal hilar lymphadenopathy. No pericardial fluid.  Review of the lung parenchyma demonstrates mild peripheral nodularity in the upper lobes suggestive an infectious or inflammatory process. This is slightly progressed compared to prior. These nodules are irregular and in a bronchovascular distribution / interstitial distribution. 6 mm nodule in the right upper lobe (image 23) compares to 5 mm on prior. The enlargement is more appreciated by visual observation of increased thickness rather than measurable disease.  CT ABDOMEN AND PELVIS FINDINGS  No focal hepatic lesion on this noncontrast exam. The low-density lesion in the right hepatic lobe likely representing a benign cyst and is unchanged. The pancreas, spleen, adrenal glands, and kidneys are normal.  The stomach, small bowel and colon are normal.  Aorta is normal caliber. No  retroperitoneal periportal lymphadenopathy. No free fluid the pelvis. No pelvic lymphadenopathy. No aggressive osseous lesion.  There is no cutaneous lesions evident. Several right inguinal lymph nodes measures slightly larger. For example 13 mm lymph node (image 130, series 2) compares to 9 mm on prior. 10 mm right inguinal node (image 122) compares to 7 mm on prior.  IMPRESSION: 1. No evidence of lymphoma recurrence within the thorax. 2. Small branching peripheral nodules in the right upper lobe are increased compared to prior. Favor infectious or inflammatory nodules. 3. No evidence of lymphoma recurrence within the abdomen or pelvis. 4. Increased size of small right inguinal lymph nodes is nonspecific. Patient may benefit from FDG PET/CT  scan for localizing potential recurrent lymphoma .   Electronically Signed   By: Suzy Bouchard M.D.   On: 05/12/2013 09:03    Medications: I have reviewed the patient's current medications.  Assessment/Plan: 1. ALK positive T-cell lymphoma. Initial diagnosis March 2003 presenting with left inguinal lymphadenopathy, lymphedema and a left lower extremity deep venous thrombosis. Stage IIA disease. Complete remission with 6 cycles of CHOP chemotherapy. Relapse in October 2009 with extensive infiltration of tumor into the right iliopsoas muscle presenting as an erythematous nodular cutaneous mass in the right inguinal region. Disease limited to muscle on PET scan. He was re-induced into remission with ICE and then received BCNU, etoposide, Cytoxan conditioning followed by autologous stem cell support at [facility] in January 2010.  2. Weight loss. Unclear etiology. 3. Normochromic anemia. Hemoglobin is stable. 4. History of deep venous thrombosis related to vascular compression from initial lymphoma. 5. Coronary artery disease status post bypass surgery. Moderate to severe mitral regurgitation status post 2 vessel PTCI and stenting using drug-eluting stents and Angiomax in June of this year. 6. 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  Dispositon-the recent CT scans did not show evidence of recurrent lymphoma. His weight is decreased as compared to about 2 weeks ago but fairly stable as compared to June 2014. He continues to have a good appetite. The reason for the weight loss remains unclear. He is scheduled to meet with the cancer Center dietitian next week. He and his wife will continue to monitor his weight closely and contact the office with continued weight loss.  His next scheduled visit is May 2015. He will contact the office in the interim as outlined above or with any other problems. We specifically discussed  progressive changes at the right low abdomen/inguinal region.   Patient seen with Dr. Benay Spice in Dr. Azucena Freed absence. 25 minutes were spent face-to-face at today's visit with the majority of that time involved in counseling/coordination of care.   Ned Card ANP/GNP-BC   This was a shared visit with Ned Card. Mr. Markoff was interviewed and examined. We reviewed the restaging CT scan.  The skin rash is most likely noninfectious. He will schedule a dermatology appointment at the skin rash does not resolve over the next few weeks.  There is slight asymmetry with firmness in the right groin and area immediately superior to the groin. This is most likely a benign finding. He will contact us if this area changes.  Julieanne Manson, M.D.

## 2013-05-26 ENCOUNTER — Encounter: Payer: Self-pay | Admitting: Oncology

## 2013-05-26 ENCOUNTER — Ambulatory Visit (HOSPITAL_COMMUNITY): Payer: Medicare Other

## 2013-05-26 ENCOUNTER — Ambulatory Visit (HOSPITAL_BASED_OUTPATIENT_CLINIC_OR_DEPARTMENT_OTHER): Payer: Medicare Other | Admitting: Oncology

## 2013-05-26 ENCOUNTER — Other Ambulatory Visit: Payer: Self-pay | Admitting: *Deleted

## 2013-05-26 ENCOUNTER — Telehealth: Payer: Self-pay | Admitting: *Deleted

## 2013-05-26 VITALS — BP 139/83 | HR 69 | Temp 97.0°F | Resp 18 | Ht 68.0 in | Wt 136.8 lb

## 2013-05-26 DIAGNOSIS — IMO0002 Reserved for concepts with insufficient information to code with codable children: Secondary | ICD-10-CM

## 2013-05-26 DIAGNOSIS — C8445 Peripheral T-cell lymphoma, not classified, lymph nodes of inguinal region and lower limb: Secondary | ICD-10-CM

## 2013-05-26 DIAGNOSIS — R21 Rash and other nonspecific skin eruption: Secondary | ICD-10-CM

## 2013-05-26 DIAGNOSIS — R1031 Right lower quadrant pain: Secondary | ICD-10-CM

## 2013-05-26 HISTORY — DX: Right lower quadrant pain: R10.31

## 2013-05-26 NOTE — Progress Notes (Signed)
Work in visit for this 73 year old man with a history of ALK positive T-cell lymphoma in second remission. He had an usual pattern of recurrence in October 2009 with extensive infiltration of lymphoma into the right iliopsoas muscle presenting as a erythematous nodular cutaneous mass in the right inguinal region. Please see my 05/09/2013 progress note for additional details. At time of that visit I was concerned with his progressive weight loss. I ordered CT scans of the chest abdomen and pelvis which were done on 05/12/2013. I personally reviewed these images with the radiologist. There is no gross evidence for recurrence. There are some prominent right inguinal lymph nodes but none larger than 13 mm. I was out of the office for a death in the family. He was seen by nurse practitioner Ned Card on February 3 to followup on the CAT scan results. On her exam, he had a firm area along the right inguinal ligament without any obvious adenopathy. My partner Dr. Benay Spice examined the patient and also felt the findings were nonspecific. The patient's wife called the office today to say he was having increasing discomfort and pain in the right inguinal region and asked if I would see him.  Over the last few days he has also developed a atypical maculopapular rash on his chest and back. He relates this coincidentally to a pneumonia vaccine that he got last week at his primary care physician's office.  On exam: Nonspecific maculopapular rash chest and back. There is an area of induration extending over about 15 cm in the area of the right inguinal ligament without any palpable adenopathy. Circumcised penis. No left inguinal adenopathy. No lower extremity edema. No calf tenderness.  Impression: Although physical findings are nonspecific, given his past history with muscle infiltration from lymphoma, I am concerned that we may be dealing with an early progression. I'm going to go ahead and get a PET scan. If there  is activity in the area of concern on physical exam and proceed with a biopsy.  With respect to the new rash, given the propensity of T-cell lymphoma for cutaneous involvement, if the rash does not resolve promptly, then I will refer him for a biopsy.

## 2013-05-26 NOTE — Telephone Encounter (Signed)
Received call from pt's wife, Harmon Pier stating that pt has noticed some swelling/puffiness in right groin & mentioned having some pain in this area last night.  The pain seems to be more when he is laying down.  She thought Dr. Beryle Beams may want to see him.  She will be away from 10am to 11:30 am but can be reached at home after that.  Note to Dr.Granfortuna.

## 2013-05-31 ENCOUNTER — Encounter (HOSPITAL_COMMUNITY)
Admission: RE | Admit: 2013-05-31 | Discharge: 2013-05-31 | Disposition: A | Discharge status: Home / Self Care | Payer: Medicare Other | Source: Ambulatory Visit | Attending: Oncology | Admitting: Oncology

## 2013-05-31 DIAGNOSIS — C8445 Peripheral T-cell lymphoma, not classified, lymph nodes of inguinal region and lower limb: Secondary | ICD-10-CM | POA: Insufficient documentation

## 2013-05-31 LAB — GLUCOSE, CAPILLARY: Glucose-Capillary: 90 mg/dL (ref 70–99)

## 2013-05-31 MED ORDER — FLUDEOXYGLUCOSE F - 18 (FDG) INJECTION
8.0000 | Freq: Once | INTRAVENOUS | Status: AC | PRN
  Administered 2013-05-31: 8 via INTRAVENOUS

## 2013-06-02 ENCOUNTER — Other Ambulatory Visit: Payer: Self-pay | Admitting: Oncology

## 2013-06-02 DIAGNOSIS — C8445 Peripheral T-cell lymphoma, not classified, lymph nodes of inguinal region and lower limb: Secondary | ICD-10-CM

## 2013-06-05 ENCOUNTER — Telehealth: Payer: Self-pay | Admitting: Oncology

## 2013-06-05 NOTE — Telephone Encounter (Signed)
[facility] will contact pt re bx via IR. no other orders per 2/13 pif. bx order in EPIC.

## 2013-06-06 ENCOUNTER — Encounter: Payer: Medicare Other | Admitting: Nutrition

## 2013-06-07 ENCOUNTER — Other Ambulatory Visit: Payer: Self-pay | Admitting: Radiology

## 2013-06-08 ENCOUNTER — Ambulatory Visit (HOSPITAL_COMMUNITY): Payer: Medicare Other

## 2013-06-09 ENCOUNTER — Ambulatory Visit (HOSPITAL_COMMUNITY): Admission: RE | Admit: 2013-06-09 | Payer: Medicare Other | Source: Ambulatory Visit

## 2013-06-09 ENCOUNTER — Telehealth: Payer: Self-pay | Admitting: *Deleted

## 2013-06-09 ENCOUNTER — Other Ambulatory Visit: Payer: Self-pay | Admitting: Radiology

## 2013-06-09 NOTE — Telephone Encounter (Signed)
Received vm call from pt's wife, Harmon Pier stating that she is leaving an update for Dr Beryle Beams.  She states that pt was scheduled for a a needle bx 06/07/13 but b/c he is on a blood thinner, effient, he was told that he would have to be off for 7 days, therefore bx r/s for 06/15/13 @ 1pm.  She states that the groin area is a little more inflamed but they will try to make do until this time & just wanted Dr Beryle Beams to know what was going on.

## 2013-06-11 ENCOUNTER — Encounter: Payer: Self-pay | Admitting: Emergency Medicine

## 2013-06-11 ENCOUNTER — Emergency Department
Admission: EM | Admit: 2013-06-11 | Discharge: 2013-06-11 | Disposition: A | Discharge status: Home / Self Care | Payer: Medicare Other | Source: Home / Self Care | Attending: Emergency Medicine | Admitting: Emergency Medicine

## 2013-06-11 DIAGNOSIS — J209 Acute bronchitis, unspecified: Secondary | ICD-10-CM

## 2013-06-11 MED ORDER — AZITHROMYCIN 250 MG PO TABS: ORAL_TABLET | ORAL | Status: DC

## 2013-06-11 MED ORDER — PROMETHAZINE-CODEINE 6.25-10 MG/5ML PO SYRP: ORAL_SOLUTION | ORAL | Status: DC

## 2013-06-11 NOTE — ED Notes (Signed)
Pt has had a cough for 5 days.  Cough is productive with green or yellow mucous. Denies any other symptoms

## 2013-06-11 NOTE — ED Provider Notes (Signed)
CSN: [id number]     Arrival date & time 06/11/13  1117 History   First MD Initiated Contact with Patient 06/11/13 1141     Chief Complaint  Patient presents with  . Cough     (Consider location/radiation/quality/duration/timing/severity/associated sxs/prior Treatment) HPI URI HISTORY  Willie [name] is a 73 y.o. male who complains of onset of cold symptoms for several days.  Have been using over-the-counter treatment which helps a little bit.  No chills/sweats + Low-grade Fever  +  Nasal congestion Mild Discolored Post-nasal drainage No sinus pain/pressure No sore throat  +  Congested cough, occasionally productive of yellow or green sputum No wheezing No chest congestion No hemoptysis No shortness of breath No pleuritic pain  No itchy/red eyes No earache  No nausea No vomiting No abdominal pain No diarrhea  No skin rashes +  Fatigue No myalgias No headache   Past Medical History  Diagnosis Date  . Hypertension   . History of DVT of lower extremity     06/2001 as first sign of lymphoma: enlarged left inguinal nodes  . Low serum testosterone level 11/10/2011  . 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 Nocturia 11/10/2011  . Normochromic anemia 11/10/2011    Likely result of prior high dose chemotherapy  . Unexplained weight loss 05/03/2012  . Mitral valvular regurgitation 06/01/12    moderate to severe by echo- asymptomatic  . Dyslipidemia   . RBBB   . Coronary artery disease   . Family history of anesthesia complication     "son, Margreta Journey, larynx collapses" (10/11/2012)  . Heart murmur   . History of blood transfusion 2009-2010    "both related to his chemo" (10/11/2012)  . Chronic renal insufficiency, stage III (moderate)   . Non Hodgkin's lymphoma 2003; 2009    "chemo; stem cell transplant & high powered chemo" (10/10/2012)  . Renal insufficiency   . Deep inguinal pain, right 05/26/2013   Past Surgical History  Procedure Laterality Date  . Bone marrow  transplant  2010  . Cardiac catheterization  10/03/2012; 2007  . Coronary angioplasty with stent placement  10/11/2012    "2" (10/11/2012)  . Tonsillectomy  1950's  . Cataract extraction w/ intraocular lens implant Left 1990's  . Coronary artery bypass graft  03/29/2006    "CABG X5" (10/11/2012)   Family History  Problem Relation Age of Onset  . Heart failure Father   . Leukemia Brother 21  . Cancer - Lung Mother    History  Substance Use Topics  . Smoking status: Former Smoker -- 0.50 packs/day for 13 years    Types: Cigarettes    Quit date: 07/01/1969  . Smokeless tobacco: Never Used  . Alcohol Use: No    Review of Systems  All other systems reviewed and are negative.      Allergies  Niaspan  Home Medications   Current Outpatient Rx  Name  Route  Sig  Dispense  Refill  . aspirin EC 81 MG tablet   Oral   Take 81 mg by mouth every evening.         Marland Kitchen azithromycin (ZITHROMAX Z-PAK) 250 MG tablet      Take 2 tablets on day one, then 1 tablet daily on days 2 through 5   1 each   0   . Calcium Carbonate-Vitamin D (CALCIUM 500 + D PO)   Oral   Take by mouth daily.         . Chlorpheniramine-Acetaminophen (CORICIDIN HBP COLD/FLU  PO)   Oral   Take 2 tablets by mouth every 4 (four) hours as needed (for allergy symptoms).         . cholecalciferol (VITAMIN D) 1000 UNITS tablet   Oral   Take 1,000 Units by mouth daily.          . fish oil-omega-3 fatty acids 1000 MG capsule   Oral   Take 1 g by mouth daily.          . metoprolol tartrate (LOPRESSOR) 25 MG tablet   Oral   Take 25 mg by mouth daily.          . Multiple Vitamin (MULTIVITAMIN WITH MINERALS) TABS   Oral   Take 1 tablet by mouth daily.         . nitroGLYCERIN (NITROSTAT) 0.4 MG SL tablet   Sublingual   Place 1 tablet (0.4 mg total) under the tongue every 5 (five) minutes as needed for chest pain.   25 tablet   2   . Potassium 99 MG TABS   Oral   Take 1 tablet by mouth daily.          . prasugrel (EFFIENT) 10 MG TABS   Oral   Take 1 tablet (10 mg total) by mouth daily.   30 tablet   10   . promethazine-codeine (PHENERGAN WITH CODEINE) 6.25-10 MG/5ML syrup      Take 1 teaspoon at bedtime as needed for cough. May cause drowsiness.   120 mL   0   . rosuvastatin (CRESTOR) 10 MG tablet   Oral   Take 10 mg by mouth 2 (two) times a week. On Wednesday and Sunday         . testosterone cypionate (DEPOTESTOTERONE CYPIONATE) 200 MG/ML injection   Intramuscular   Inject 200 mg into the muscle every 21 ( twenty-one) days. Next injection due 09/15/12         . vitamin B-12 (CYANOCOBALAMIN) 500 MCG tablet   Oral   Take 500 mcg by mouth daily.          BP 100/68  Pulse 66  Temp(Src) 97.9 F (36.6 C) (Oral)  Ht 5\' 9"  (1.753 m)  Wt 130 lb 8 oz (59.194 kg)  BMI 19.26 kg/m2  SpO2 98% Physical Exam  Nursing note and vitals reviewed. Constitutional: He is oriented to person, place, and time. He appears well-developed and well-nourished. No distress.  HENT:  Head: Normocephalic and atraumatic.  Right Ear: Tympanic membrane normal.  Left Ear: Tympanic membrane normal.  Nose: Nose normal.  Mouth/Throat: Oropharynx is clear and moist. No oropharyngeal exudate.  Eyes: Conjunctivae and EOM are normal. Pupils are equal, round, and reactive to light. Right eye exhibits no discharge. Left eye exhibits no discharge. No scleral icterus.  Neck: Normal range of motion. Neck supple.  Cardiovascular: Normal rate, regular rhythm and normal heart sounds.   Pulmonary/Chest: Effort normal. No respiratory distress. He has no wheezes. He has rhonchi. He has no rales.  Abdominal: He exhibits no distension.  Musculoskeletal: Normal range of motion.  Lymphadenopathy:    He has no cervical adenopathy.  Neurological: He is alert and oriented to person, place, and time.  Skin: Skin is warm and dry.  Psychiatric: He has a normal mood and affect.    ED Course  Procedures  (including critical care time) Labs Review Labs Reviewed - No data to display Imaging Review No results found.    MDM   Final diagnoses:  Acute  bronchitis    Treatment options discussed, as well as risks, benefits, alternatives. Patient voiced understanding and agreement with the following plans: Z-Pak Phenergan with codeine cough syrup, but instructions carefully explained and precautions discussed Follow-up with your primary care doctor in 5-7 days if not improving, or sooner if symptoms become worse. Precautions discussed. Red flags discussed. Questions invited and answered. Patient voiced understanding and agreement.     Jacqulyn Cane, MD 06/11/13 [phone]

## 2013-06-12 ENCOUNTER — Other Ambulatory Visit: Payer: Self-pay | Admitting: Radiology

## 2013-06-14 ENCOUNTER — Telehealth: Payer: Self-pay | Admitting: *Deleted

## 2013-06-14 ENCOUNTER — Encounter (HOSPITAL_COMMUNITY): Payer: Self-pay | Admitting: Pharmacy Technician

## 2013-06-14 NOTE — Telephone Encounter (Signed)
Wife called and LM:  She thinks her husband's "lymphoma is spreading quickly".  Areas are red and inflammed.  He has increased pain.  She thinks he will need lab work again (last was 05/26/13).  He is having a biopsy in the morning and she wants Dr. Beryle Beams to look at it ASAP.  (Next scheduled appt. Is lab/MD with Dr. Alvy Bimler 08-28-13)  Wife call back is [phone].   Note to Dr. Beryle Beams.

## 2013-06-15 ENCOUNTER — Ambulatory Visit (HOSPITAL_COMMUNITY): Payer: Medicare Other

## 2013-06-15 ENCOUNTER — Encounter (HOSPITAL_COMMUNITY): Payer: Self-pay

## 2013-06-15 ENCOUNTER — Ambulatory Visit (HOSPITAL_COMMUNITY)
Admission: RE | Admit: 2013-06-15 | Discharge: 2013-06-15 | Disposition: A | Discharge status: Home / Self Care | Payer: Medicare Other | Source: Ambulatory Visit | Attending: Oncology | Admitting: Oncology

## 2013-06-15 DIAGNOSIS — N183 Chronic kidney disease, stage 3 unspecified: Secondary | ICD-10-CM | POA: Insufficient documentation

## 2013-06-15 DIAGNOSIS — E785 Hyperlipidemia, unspecified: Secondary | ICD-10-CM | POA: Insufficient documentation

## 2013-06-15 DIAGNOSIS — Z79899 Other long term (current) drug therapy: Secondary | ICD-10-CM | POA: Insufficient documentation

## 2013-06-15 DIAGNOSIS — I129 Hypertensive chronic kidney disease with stage 1 through stage 4 chronic kidney disease, or unspecified chronic kidney disease: Secondary | ICD-10-CM | POA: Insufficient documentation

## 2013-06-15 DIAGNOSIS — I82409 Acute embolism and thrombosis of unspecified deep veins of unspecified lower extremity: Secondary | ICD-10-CM | POA: Insufficient documentation

## 2013-06-15 DIAGNOSIS — E291 Testicular hypofunction: Secondary | ICD-10-CM | POA: Insufficient documentation

## 2013-06-15 DIAGNOSIS — Z951 Presence of aortocoronary bypass graft: Secondary | ICD-10-CM | POA: Insufficient documentation

## 2013-06-15 DIAGNOSIS — C8445 Peripheral T-cell lymphoma, not classified, lymph nodes of inguinal region and lower limb: Secondary | ICD-10-CM

## 2013-06-15 DIAGNOSIS — Z86718 Personal history of other venous thrombosis and embolism: Secondary | ICD-10-CM | POA: Insufficient documentation

## 2013-06-15 DIAGNOSIS — R599 Enlarged lymph nodes, unspecified: Secondary | ICD-10-CM | POA: Insufficient documentation

## 2013-06-15 DIAGNOSIS — M818 Other osteoporosis without current pathological fracture: Secondary | ICD-10-CM | POA: Insufficient documentation

## 2013-06-15 DIAGNOSIS — Z9481 Bone marrow transplant status: Secondary | ICD-10-CM | POA: Insufficient documentation

## 2013-06-15 DIAGNOSIS — Z87891 Personal history of nicotine dependence: Secondary | ICD-10-CM | POA: Insufficient documentation

## 2013-06-15 DIAGNOSIS — Z7982 Long term (current) use of aspirin: Secondary | ICD-10-CM | POA: Insufficient documentation

## 2013-06-15 DIAGNOSIS — R011 Cardiac murmur, unspecified: Secondary | ICD-10-CM | POA: Insufficient documentation

## 2013-06-15 DIAGNOSIS — Z87898 Personal history of other specified conditions: Secondary | ICD-10-CM | POA: Insufficient documentation

## 2013-06-15 DIAGNOSIS — T387X5A Adverse effect of androgens and anabolic congeners, initial encounter: Secondary | ICD-10-CM | POA: Insufficient documentation

## 2013-06-15 DIAGNOSIS — I2510 Atherosclerotic heart disease of native coronary artery without angina pectoris: Secondary | ICD-10-CM | POA: Insufficient documentation

## 2013-06-15 DIAGNOSIS — I059 Rheumatic mitral valve disease, unspecified: Secondary | ICD-10-CM | POA: Insufficient documentation

## 2013-06-15 LAB — CBC
HEMATOCRIT: 35.7 % — AB (ref 39.0–52.0)
HEMOGLOBIN: 11.8 g/dL — AB (ref 13.0–17.0)
MCH: 31.8 pg (ref 26.0–34.0)
MCHC: 33.1 g/dL (ref 30.0–36.0)
MCV: 96.2 fL (ref 78.0–100.0)
Platelets: 364 10*3/uL (ref 150–400)
RBC: 3.71 MIL/uL — ABNORMAL LOW (ref 4.22–5.81)
RDW: 13.5 % (ref 11.5–15.5)
WBC: 13 10*3/uL — ABNORMAL HIGH (ref 4.0–10.5)

## 2013-06-15 LAB — PROTIME-INR
INR: 0.99 (ref 0.00–1.49)
Prothrombin Time: 12.9 seconds (ref 11.6–15.2)

## 2013-06-15 LAB — APTT: aPTT: 32 seconds (ref 24–37)

## 2013-06-15 LAB — POTASSIUM: Potassium: 3.8 mEq/L (ref 3.7–5.3)

## 2013-06-15 MED ORDER — FENTANYL CITRATE 0.05 MG/ML IJ SOLN
INTRAMUSCULAR | Status: AC | PRN
  Administered 2013-06-15: 100 ug via INTRAVENOUS

## 2013-06-15 MED ORDER — SODIUM CHLORIDE 0.9 % IV SOLN
Freq: Once | INTRAVENOUS | Status: AC
  Administered 2013-06-15: 11:00:00 via INTRAVENOUS

## 2013-06-15 MED ORDER — MIDAZOLAM HCL 2 MG/2ML IJ SOLN
INTRAMUSCULAR | Status: AC
  Filled 2013-06-15: qty 4

## 2013-06-15 MED ORDER — HYDROCODONE-ACETAMINOPHEN 5-325 MG PO TABS: 1.0000 | ORAL_TABLET | ORAL | Status: DC | PRN

## 2013-06-15 MED ORDER — FENTANYL CITRATE 0.05 MG/ML IJ SOLN
INTRAMUSCULAR | Status: AC
  Filled 2013-06-15: qty 4

## 2013-06-15 MED ORDER — MIDAZOLAM HCL 2 MG/2ML IJ SOLN
INTRAMUSCULAR | Status: AC | PRN
  Administered 2013-06-15: 2 mg via INTRAVENOUS

## 2013-06-15 NOTE — H&P (Signed)
Willie [name] is an 73 y.o. male.   Chief Complaint: "I'm having a biopsy" HPI: Patient with past history of T cell lymphoma/NHL and now with hypermetabolic right inguinal mass/adenopathy on PET presents today for US guided right inguinal mass biopsy.  Past Medical History  Diagnosis Date  . Hypertension   . History of DVT of lower extremity     06/2001 as first sign of lymphoma: enlarged left inguinal nodes  . Low serum testosterone level 11/10/2011  . 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 Nocturia 11/10/2011  . Normochromic anemia 11/10/2011    Likely result of prior high dose chemotherapy  . Unexplained weight loss 05/03/2012  . Mitral valvular regurgitation 06/01/12    moderate to severe by echo- asymptomatic  . Dyslipidemia   . RBBB   . Coronary artery disease   . Family history of anesthesia complication     "son, Willie [name], larynx collapses" (10/11/2012)  . Heart murmur   . History of blood transfusion 2009-2010    "both related to his chemo" (10/11/2012)  . Chronic renal insufficiency, stage III (moderate)   . Non Hodgkin's lymphoma 2003; 2009    "chemo; stem cell transplant & high powered chemo" (10/10/2012)  . Renal insufficiency   . Deep inguinal pain, right 05/26/2013    Past Surgical History  Procedure Laterality Date  . Bone marrow transplant  2010  . Cardiac catheterization  10/03/2012; 2007  . Coronary angioplasty with stent placement  10/11/2012    "2" (10/11/2012)  . Tonsillectomy  1950's  . Cataract extraction w/ intraocular lens implant Left 1990's  . Coronary artery bypass graft  03/29/2006    "CABG X5" (10/11/2012)    Family History  Problem Relation Age of Onset  . Heart failure Father   . Leukemia Brother 21  . Cancer - Lung Mother    Social History:  reports that he quit smoking about 43 years ago. His smoking use included Cigarettes. He has a 6.5 pack-year smoking history. He has never used smokeless tobacco. He reports that he does not drink  alcohol or use illicit drugs.  Allergies:  Allergies  Allergen Reactions  . Niaspan [Niacin Er] Rash    Current outpatient prescriptions:aspirin EC 81 MG tablet, Take 81 mg by mouth every evening., Disp: , Rfl: ;  azithromycin (ZITHROMAX) 500 MG tablet, Take 500 mg by mouth daily. STARTED 5 DAY SUPPLY ON 06/11/13, Disp: , Rfl: ;  Calcium Carbonate-Vitamin D (CALCIUM 500 + D PO), Take 1 tablet by mouth daily. , Disp: , Rfl:  Chlorpheniramine-Acetaminophen (CORICIDIN HBP COLD/FLU PO), Take 2 tablets by mouth every 4 (four) hours as needed (for allergy symptoms)., Disp: , Rfl: ;  cholecalciferol (VITAMIN D) 1000 UNITS tablet, Take 1,000 Units by mouth daily. , Disp: , Rfl: ;  fish oil-omega-3 fatty acids 1000 MG capsule, Take 1 g by mouth daily. , Disp: , Rfl:  metoprolol tartrate (LOPRESSOR) 25 MG tablet, Take 25 mg by mouth every morning. verified that it is not XL, Disp: , Rfl: ;  Multiple Vitamin (MULTIVITAMIN WITH MINERALS) TABS, Take 1 tablet by mouth daily., Disp: , Rfl: ;  nitroGLYCERIN (NITROSTAT) 0.4 MG SL tablet, Place 1 tablet (0.4 mg total) under the tongue every 5 (five) minutes as needed for chest pain., Disp: 25 tablet, Rfl: 2 Potassium 99 MG TABS, Take 1 tablet by mouth daily., Disp: , Rfl: ;  prasugrel (EFFIENT) 10 MG TABS tablet, Take 10 mg by mouth every evening., Disp: , Rfl: ;  promethazine-codeine (PHENERGAN WITH CODEINE) 6.25-10 MG/5ML syrup, Take 5 mLs by mouth 2 (two) times daily as needed for cough., Disp: , Rfl: ;  rosuvastatin (CRESTOR) 10 MG tablet, Take 10 mg by mouth 2 (two) times a week. On Wednesday and Sunday, Disp: , Rfl:  testosterone cypionate (DEPOTESTOTERONE CYPIONATE) 200 MG/ML injection, Inject 200 mg into the muscle every 21 ( twenty-one) days. Next injection due 06/08/13 PATIENT DID NOT GET THIS AND IS NOT GOING BACK UNTIL AFTER THIS PROCEDURE, Disp: , Rfl: ;  vitamin B-12 (CYANOCOBALAMIN) 500 MCG tablet, Take 500 mcg by mouth daily., Disp: , Rfl:   No results  found for this or any previous visit (from the past 48 hour(s)). No results found.  Review of Systems  Constitutional: Positive for weight loss. Negative for fever and chills.  Eyes: Positive for redness.       Sl conjuctival redness, irritation rt eye  Respiratory: Negative for hemoptysis and shortness of breath.        Occ cough  Cardiovascular: Negative for chest pain.  Gastrointestinal: Negative for nausea, vomiting, abdominal pain and blood in stool.  Genitourinary: Negative for dysuria and hematuria.       Rt inguinal region pain with radiation down rt leg secondary to adenopathy  Musculoskeletal: Negative for back pain.  Skin: Positive for rash.  Neurological: Negative for headaches.  Endo/Heme/Allergies: Does not bruise/bleed easily.   Vitals: BP 123/67   HR 117  R 18  O2 SATS 100% RA  TEMP 97 Physical Exam  Constitutional: He is oriented to person, place, and time.  thin WM in NAD  Cardiovascular: Regular rhythm.   Murmur heard. Tachy   Respiratory: Effort normal and breath sounds normal.  GI: Soft. Bowel sounds are normal.  Prominent rt inguinal adenopathy/mass tender to palpation with surrounding erythema  Musculoskeletal: Normal range of motion.  1+ RLE edema, LLE with no edema;   Neurological: He is alert and oriented to person, place, and time.     Assessment/Plan Patient with past history of T cell lymphoma/NHL and now with hypermetabolic right inguinal mass/adenopathy on PET presents today for US guided right inguinal mass biopsy. Details/risks of procedure d/w pt/wife with their understanding and consent.   ALLRED,D KEVIN 06/15/2013, 10:44 AM

## 2013-06-15 NOTE — Discharge Instructions (Signed)
Moderate Sedation, Adult Moderate sedation is given to help you relax or even sleep through a procedure. You may remain sleepy, be clumsy, or have poor balance for several hours following this procedure. Arrange for a responsible adult, family member, or friend to take you home. A responsible adult should stay with you for at least 24 hours or until the medicines have worn off.  Do not participate in any activities where you could become injured for the next 24 hours, or until you feel normal again. Do not:  Drive.  Swim.  Ride a bicycle.  Operate heavy machinery.  Cook.  Use power tools.  Climb ladders.  Work at [employer].  Do not make important decisions or sign legal documents until you are improved.  Vomiting may occur if you eat too soon. When you can drink without vomiting, try water, juice, or soup. Try solid foods if you feel little or no nausea.  Only take over-the-counter or prescription medications for pain, discomfort, or fever as directed by your caregiver.If pain medications have been prescribed for you, ask your caregiver how soon it is safe to take them.  Make sure you and your family fully understands everything about the medication given to you. Make sure you understand what side effects may occur.  You should not drink alcohol, take sleeping pills, or medications that cause drowsiness for at least 24 hours.  If you smoke, do not smoke alone.  If you are feeling better, you may resume normal activities 24 hours after receiving sedation.  Keep all appointments as scheduled. Follow all instructions.  Ask questions if you do not understand. SEEK MEDICAL CARE IF:   Your skin is pale or bluish in color.  You continue to feel sick to your stomach (nauseous) or throw up (vomit).  Your pain is getting worse and not helped by medication.  You have bleeding or swelling.  You are still sleepy or feeling clumsy after 24 hours. SEEK IMMEDIATE MEDICAL CARE IF:    You develop a rash.  You have difficulty breathing.  You develop any type of allergic problem.  You have a fever. Document Released: 12/30/2000 Document Revised: 06/29/2011 Document Reviewed: 12/12/2012 [facility] Patient Information 2014 [place]. Biopsy Care After Refer to this sheet in the next few weeks. These instructions provide you with information on caring for yourself after your procedure. Your caregiver may also give you more specific instructions. Your treatment has been planned according to current medical practices, but problems sometimes occur. Call your caregiver if you have any problems or questions after your procedure. If you had a fine needle biopsy, you may have soreness at the biopsy site for 1 to 2 days. If you had an open biopsy, you may have soreness at the biopsy site for 3 to 4 days. HOME CARE INSTRUCTIONS   You may resume normal diet and activities as directed.  Change bandages (dressings) as directed. If your wound was closed with a skin glue (adhesive), it will wear off and begin to peel in 7 days.  Only take over-the-counter or prescription medicines for pain, discomfort, or fever as directed by your caregiver.  Ask your caregiver when you can bathe and get your wound wet. SEEK IMMEDIATE MEDICAL CARE IF:   You have increased bleeding (more than a small spot) from the biopsy site.  You notice redness, swelling, or increasing pain at the biopsy site.  You have pus coming from the biopsy site.  You have a fever.  You notice  a bad smell coming from the biopsy site or dressing.  You have a rash, have difficulty breathing, or have any allergic problems. MAKE SURE YOU:   Understand these instructions.  Will watch your condition.  Will get help right away if you are not doing well or get worse. Document Released: 10/24/2004 Document Revised: 06/29/2011 Document Reviewed: 10/02/2010 [facility] Patient Information 2014 [place].

## 2013-06-15 NOTE — Procedures (Signed)
R inguinal LN Bx 18 g core times 6 No comp

## 2013-06-17 ENCOUNTER — Encounter: Payer: Self-pay | Admitting: Emergency Medicine

## 2013-06-17 ENCOUNTER — Emergency Department
Admission: EM | Admit: 2013-06-17 | Discharge: 2013-06-17 | Disposition: A | Discharge status: Home / Self Care | Payer: Medicare Other | Source: Home / Self Care | Attending: Family Medicine | Admitting: Family Medicine

## 2013-06-17 ENCOUNTER — Emergency Department (INDEPENDENT_AMBULATORY_CARE_PROVIDER_SITE_OTHER): Payer: Medicare Other

## 2013-06-17 ENCOUNTER — Encounter: Payer: Self-pay | Admitting: Oncology

## 2013-06-17 DIAGNOSIS — J209 Acute bronchitis, unspecified: Secondary | ICD-10-CM

## 2013-06-17 DIAGNOSIS — R05 Cough: Secondary | ICD-10-CM

## 2013-06-17 DIAGNOSIS — R059 Cough, unspecified: Secondary | ICD-10-CM

## 2013-06-17 DIAGNOSIS — R918 Other nonspecific abnormal finding of lung field: Secondary | ICD-10-CM

## 2013-06-17 LAB — POCT CBC W AUTO DIFF (K'VILLE URGENT CARE)

## 2013-06-17 MED ORDER — LEVOFLOXACIN 500 MG PO TABS: 500.0000 mg | ORAL_TABLET | Freq: Every day | ORAL | Status: DC

## 2013-06-17 NOTE — ED Notes (Signed)
Patient c/o productive cough x 2 wks. States he has been prescribed Z-pak, cough syrup without relief.

## 2013-06-17 NOTE — Discharge Instructions (Signed)
Take plain Mucinex (1200 mg guaifenesin) twice daily for cough and congestion.  Increase fluid intake, rest..  Stop all antihistamines for now, and other non-prescription cough/cold preparations. May continue cough suppressant at bedtime. Follow-up with family doctor if not improving one week.

## 2013-06-17 NOTE — ED Provider Notes (Signed)
CSN: [id number]     Arrival date & time 06/17/13  1333 History   First MD Initiated Contact with Patient 06/17/13 1420     Chief Complaint  Patient presents with  . Cough      HPI Comments: Patient was treated for bronchitis six days ago with azithromycin.  He complains of persistent cough.  No fevers, chills, and sweats.  The history is provided by the patient.    Past Medical History  Diagnosis Date  . Hypertension   . History of DVT of lower extremity     06/2001 as first sign of lymphoma: enlarged left inguinal nodes  . Low serum testosterone level 11/10/2011  . 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 Nocturia 11/10/2011  . Normochromic anemia 11/10/2011    Likely result of prior high dose chemotherapy  . Unexplained weight loss 05/03/2012  . Mitral valvular regurgitation 06/01/12    moderate to severe by echo- asymptomatic  . Dyslipidemia   . RBBB   . Coronary artery disease   . Family history of anesthesia complication     "son, Margreta Journey, larynx collapses" (10/11/2012)  . Heart murmur   . History of blood transfusion 2009-2010    "both related to his chemo" (10/11/2012)  . Chronic renal insufficiency, stage III (moderate)   . Non Hodgkin's lymphoma 2003; 2009    "chemo; stem cell transplant & high powered chemo" (10/10/2012)  . Renal insufficiency   . Deep inguinal pain, right 05/26/2013   Past Surgical History  Procedure Laterality Date  . Bone marrow transplant  2010  . Cardiac catheterization  10/03/2012; 2007  . Coronary angioplasty with stent placement  10/11/2012    "2" (10/11/2012)  . Tonsillectomy  1950's  . Cataract extraction w/ intraocular lens implant Left 1990's  . Coronary artery bypass graft  03/29/2006    "CABG X5" (10/11/2012)   Family History  Problem Relation Age of Onset  . Heart failure Father   . Leukemia Brother 21  . Cancer - Lung Mother    History  Substance Use Topics  . Smoking status: Former Smoker -- 0.50 packs/day for 13 years     Types: Cigarettes    Quit date: 07/01/1969  . Smokeless tobacco: Never Used  . Alcohol Use: No    Review of Systems No sore throat + cough No pleuritic pain No wheezing + nasal congestion + post-nasal drainage No sinus pain/pressure No itchy/red eyes No earache No hemoptysis No SOB No fever/chills No nausea No vomiting No abdominal pain No diarrhea No urinary symptoms No skin rash + fatigue No myalgias No headache Used OTC meds without relief  Allergies  Niaspan  Home Medications   Current Outpatient Rx  Name  Route  Sig  Dispense  Refill  . aspirin EC 81 MG tablet   Oral   Take 81 mg by mouth every evening.         . Calcium Carbonate-Vitamin D (CALCIUM 500 + D PO)   Oral   Take 1 tablet by mouth daily.          . cholecalciferol (VITAMIN D) 1000 UNITS tablet   Oral   Take 1,000 Units by mouth daily.          . fish oil-omega-3 fatty acids 1000 MG capsule   Oral   Take 1 g by mouth daily.          Marland Kitchen levofloxacin (LEVAQUIN) 500 MG tablet   Oral   Take 1 tablet (  500 mg total) by mouth daily.   7 tablet   0   . metoprolol tartrate (LOPRESSOR) 25 MG tablet   Oral   Take 25 mg by mouth every morning. verified that it is not XL         . Multiple Vitamin (MULTIVITAMIN WITH MINERALS) TABS   Oral   Take 1 tablet by mouth daily.         . nitroGLYCERIN (NITROSTAT) 0.4 MG SL tablet   Sublingual   Place 1 tablet (0.4 mg total) under the tongue every 5 (five) minutes as needed for chest pain.   25 tablet   2   . Potassium 99 MG TABS   Oral   Take 1 tablet by mouth daily.         . prasugrel (EFFIENT) 10 MG TABS tablet   Oral   Take 10 mg by mouth every evening.         . promethazine-codeine (PHENERGAN WITH CODEINE) 6.25-10 MG/5ML syrup   Oral   Take 5 mLs by mouth 2 (two) times daily as needed for cough.         . rosuvastatin (CRESTOR) 10 MG tablet   Oral   Take 10 mg by mouth 2 (two) times a week. On Wednesday and  Sunday         . testosterone cypionate (DEPOTESTOTERONE CYPIONATE) 200 MG/ML injection   Intramuscular   Inject 200 mg into the muscle every 21 ( twenty-one) days. Next injection due 06/08/13 PATIENT DID NOT GET THIS AND IS NOT GOING BACK UNTIL AFTER THIS PROCEDURE         . vitamin B-12 (CYANOCOBALAMIN) 500 MCG tablet   Oral   Take 500 mcg by mouth daily.          BP 111/73  Pulse 105  Resp 16  Ht 5\' 9"  (1.753 m)  Wt 130 lb 12 oz (59.308 kg)  BMI 19.30 kg/m2  SpO2 100% Physical Exam Nursing notes and Vital Signs reviewed. Appearance:  Patient appears stated age, and in no acute distress Eyes:  Pupils are equal, round, and reactive to light and accomodation.  Extraocular movement is intact.  Conjunctivae are not inflamed  Ears:  Canals normal.  Tympanic membranes normal.  Nose:  Mildly congested turbinates.  No sinus tenderness.   Pharynx:  Normal; moist mucous membranes  Neck:  Supple.  No adenopathy Lungs:  Clear to auscultation.  Breath sounds are equal.  Heart:  Regular rate and rhythm with Gr 2/6 murmur.  No rubs or gallops.  Abdomen:  Nontender without masses or hepatosplenomegaly.  Bowel sounds are present.  No CVA or flank tenderness.  Extremities:  No edema.  No calf tenderness Skin:  No rash present.   ED Course  Procedures  none    Labs Reviewed  POCT CBC W AUTO DIFF ([facility]): WBC 12.2; LY 12.3; MO 5.6; GR 82.1; Hgb 10.5; Platelets 303    Imaging Review Dg Chest 2 View  06/17/2013   CLINICAL DATA:  Two weeks of cough, nonsmoker  EXAM: CHEST  2 VIEW  COMPARISON:  CT CHEST W/O CM dated 05/12/2013; CT CHEST W/O CM dated 04/29/2011; NM PET IMAGE RESTAG (PS) SKULL BASE TO THIGH dated 05/31/2013  FINDINGS: Grossly unchanged cardiac silhouette and mediastinal contours post median sternotomy and CABG. Atherosclerotic plaque within a mildly tortuous thoracic aorta. The lungs appear hyperexpanded with mild diffuse slightly nodular thickening of the pulmonary  interstitium. No discrete focal airspace  opacities. No pleural effusion or pneumothorax. No evidence of edema. Unchanged bones.  IMPRESSION: Hyperexpanded lungs without acute cardiopulmonary disease.   Electronically Signed   By: Sandi Mariscal M.D.   On: 06/17/2013 15:09     MDM   1. Acute bronchitis    Begin Levaquin Take plain Mucinex (1200 mg guaifenesin) twice daily for cough and congestion.  Increase fluid intake, rest..  Stop all antihistamines for now, and other non-prescription cough/cold preparations. May continue cough suppressant at bedtime. Follow-up with family doctor if not improving one week.    Kandra Nicolas, MD 06/19/13 2133

## 2013-06-19 ENCOUNTER — Telehealth: Payer: Self-pay | Admitting: Hematology and Oncology

## 2013-06-19 ENCOUNTER — Other Ambulatory Visit: Payer: Self-pay | Admitting: *Deleted

## 2013-06-19 ENCOUNTER — Other Ambulatory Visit: Payer: Self-pay | Admitting: Oncology

## 2013-06-19 ENCOUNTER — Ambulatory Visit (HOSPITAL_BASED_OUTPATIENT_CLINIC_OR_DEPARTMENT_OTHER): Payer: Medicare Other

## 2013-06-19 ENCOUNTER — Ambulatory Visit (HOSPITAL_BASED_OUTPATIENT_CLINIC_OR_DEPARTMENT_OTHER): Payer: Medicare Other | Admitting: Oncology

## 2013-06-19 VITALS — BP 127/75 | HR 102 | Temp 97.9°F | Resp 18 | Ht 69.0 in | Wt 129.3 lb

## 2013-06-19 DIAGNOSIS — I059 Rheumatic mitral valve disease, unspecified: Secondary | ICD-10-CM

## 2013-06-19 DIAGNOSIS — R634 Abnormal weight loss: Secondary | ICD-10-CM

## 2013-06-19 DIAGNOSIS — R011 Cardiac murmur, unspecified: Secondary | ICD-10-CM

## 2013-06-19 DIAGNOSIS — I340 Nonrheumatic mitral (valve) insufficiency: Secondary | ICD-10-CM

## 2013-06-19 DIAGNOSIS — IMO0002 Reserved for concepts with insufficient information to code with codable children: Secondary | ICD-10-CM

## 2013-06-19 DIAGNOSIS — C8445 Peripheral T-cell lymphoma, not classified, lymph nodes of inguinal region and lower limb: Secondary | ICD-10-CM

## 2013-06-19 LAB — CBC WITH DIFFERENTIAL/PLATELET
BASO%: 0.2 % (ref 0.0–2.0)
Basophils Absolute: 0 10*3/uL (ref 0.0–0.1)
EOS ABS: 0.1 10*3/uL (ref 0.0–0.5)
EOS%: 0.6 % (ref 0.0–7.0)
HCT: 32.9 % — ABNORMAL LOW (ref 38.4–49.9)
HGB: 10.7 g/dL — ABNORMAL LOW (ref 13.0–17.1)
LYMPH#: 1 10*3/uL (ref 0.9–3.3)
LYMPH%: 8.5 % — ABNORMAL LOW (ref 14.0–49.0)
MCH: 31.2 pg (ref 27.2–33.4)
MCHC: 32.4 g/dL (ref 32.0–36.0)
MCV: 96.1 fL (ref 79.3–98.0)
MONO#: 1.1 10*3/uL — AB (ref 0.1–0.9)
MONO%: 9.4 % (ref 0.0–14.0)
NEUT%: 81.3 % — ABNORMAL HIGH (ref 39.0–75.0)
NEUTROS ABS: 10 10*3/uL — AB (ref 1.5–6.5)
Platelets: 357 10*3/uL (ref 140–400)
RBC: 3.43 10*6/uL — AB (ref 4.20–5.82)
RDW: 13.5 % (ref 11.0–14.6)
WBC: 12.3 10*3/uL — AB (ref 4.0–10.3)

## 2013-06-19 LAB — COMPREHENSIVE METABOLIC PANEL (CC13)
ALT: 22 U/L (ref 0–55)
ANION GAP: 8 meq/L (ref 3–11)
AST: 32 U/L (ref 5–34)
Albumin: 3.3 g/dL — ABNORMAL LOW (ref 3.5–5.0)
Alkaline Phosphatase: 94 U/L (ref 40–150)
BILIRUBIN TOTAL: 0.48 mg/dL (ref 0.20–1.20)
BUN: 14.6 mg/dL (ref 7.0–26.0)
CO2: 25 mEq/L (ref 22–29)
CREATININE: 1.5 mg/dL — AB (ref 0.7–1.3)
Calcium: 9.8 mg/dL (ref 8.4–10.4)
Chloride: 102 mEq/L (ref 98–109)
Glucose: 99 mg/dl (ref 70–140)
Potassium: 3.7 mEq/L (ref 3.5–5.1)
Sodium: 135 mEq/L — ABNORMAL LOW (ref 136–145)
Total Protein: 7.1 g/dL (ref 6.4–8.3)

## 2013-06-19 LAB — LACTATE DEHYDROGENASE (CC13): LDH: 215 U/L (ref 125–245)

## 2013-06-19 LAB — CHCC SMEAR

## 2013-06-19 LAB — MORPHOLOGY: PLT EST: ADEQUATE

## 2013-06-19 NOTE — Progress Notes (Signed)
Hematology and Oncology Follow Up Visit  Willie [name] [id number] 1941/04/08 73 y.o. 06/19/2013 5:11 PM   Principle Diagnosis: Encounter Diagnoses  Name Primary?  . Heart murmur, systolic   . Unexplained weight loss   . Severe mitral regurgitation   . 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 Unfortunately, there was only scant lymph node tissue in the biopsy and this tissue showed reactive hyperplasia but no areas suspicious for malignancy. In the interim, he has experienced increasing pain and induration of the soft tissues of the right inguinal region with superficial erythema, pruritus, and desquamation of the superficial layers of the skin. On my exam today, there is still no palpable inguinal lymphadenopathy. No testicular mass. There is a wide area of induration of the soft tissues following the inguinal ligament with 2 large areas approximately 4 cm in diameter each were the skin is erythematous with peeling of the superficial layers.   Medications: reviewed  Allergies:  Allergies  Allergen Reactions  . Niaspan [Niacin Er] Rash    Review of Systems: Constitutional: He continues to lose weight despite good appetite, normal thyroid tests, negative. No fevers or night sweats. PET scan except for hypermetabolic activity in the right inguinal area. Hematology:  No bleeding or bruising ENT ROS: No sore throat Breast ROS:  Respiratory ROS: Persistent, dry cough no fever Cardiovascular ROS:  No chest pain or palpitations Gastrointestinal ROS: No abdominal pain or change in bowel habit   Genito-Urinary ROS: Not questioned Musculoskeletal ROS: No bone or joint pain Neurological ROS: No headache or change in vision Dermatological ROS: Localized  skin changes right inguinal region Remaining ROS negative:   Physical Exam: Blood pressure 127/75, pulse 102, temperature 97.9 F (36.6 C), temperature source Oral, resp. rate 18, height 5' 9" (1.753 m), weight 129 lb 4.8 oz (58.65 kg). Wt Readings from Last 3 Encounters:  06/19/13 129 lb 4.8 oz (58.65 kg)  06/17/13 130 lb 12 oz (59.308 kg)  06/15/13 130 lb (58.968 kg)     General appearance: Cachectic-appearing Caucasian man HENNT: Pharynx no erythema, exudate, mass, or ulcer. No thyromegaly or thyroid nodules Lymph nodes: No cervical, supraclavicular, or axillary lymphadenopathy Breasts:  Lungs: Clear to auscultation, resonant to percussion throughout Heart: Regular rhythm, no murmur, no gallop, no rub, no click, no edema Abdomen: Soft, nontender, normal bowel sounds, no mass, no organomegaly Extremities: No edema, no calf tenderness Musculoskeletal: Induration and erythema right inguinal region-see history of present illness GU:  Vascular: Carotid pulses 2+, no bruits,  Neurologic: Alert, oriented, PERRLA,   cranial nerves grossly normal, motor strength 5 over 5, reflexes 1+ symmetric, upper body coordination normal, gait normal, Skin: No rash or ecchymosis  Lab Results: CBC W/Diff    Component Value Date/Time   WBC 12.3* 06/19/2013 1246   WBC 13.0* 06/15/2013 1030   RBC 3.43* 06/19/2013 1246   RBC 3.71* 06/15/2013 1030   HGB 10.7* 06/19/2013 1246   HGB 11.8* 06/15/2013 1030   HCT 32.9* 06/19/2013 1246   HCT 35.7* 06/15/2013 1030   PLT 357 06/19/2013 1246   PLT 364 06/15/2013 1030   MCV 96.1 06/19/2013 1246   MCV 96.2 06/15/2013 1030   MCH 31.2 06/19/2013 1246   MCH 31.8 06/15/2013 1030  MCHC 32.4 06/19/2013 1246   MCHC 33.1 06/15/2013 1030   RDW 13.5 06/19/2013 1246   RDW 13.5 06/15/2013 1030   LYMPHSABS 1.0 06/19/2013 1246   LYMPHSABS 0.8 01/24/2008 1842   MONOABS 1.1* 06/19/2013 1246   MONOABS 0.8 01/24/2008 1842   EOSABS 0.1 06/19/2013 1246   EOSABS 0.2 01/24/2008 1842   BASOSABS 0.0  06/19/2013 1246   BASOSABS 0.0 01/24/2008 1842     Chemistry      Component Value Date/Time   NA 135* 06/19/2013 1246   NA 137 10/12/2012 0521   NA 143 04/29/2011 1201   K 3.7 06/19/2013 1246   K 3.8 06/15/2013 1030   K 4.2 04/29/2011 1201   CL 105 10/12/2012 0521   CL 106 05/03/2012 1149   CL 102 04/29/2011 1201   CO2 25 06/19/2013 1246   CO2 26 10/12/2012 0521   CO2 29 04/29/2011 1201   BUN 14.6 06/19/2013 1246   BUN 15 10/12/2012 0521   BUN 15 04/29/2011 1201   CREATININE 1.5* 06/19/2013 1246   CREATININE 1.51* 10/12/2012 0521   CREATININE 1.3* 04/29/2011 1201      Component Value Date/Time   CALCIUM 9.8 06/19/2013 1246   CALCIUM 9.0 10/12/2012 0521   CALCIUM 9.2 04/29/2011 1201   ALKPHOS 94 06/19/2013 1246   ALKPHOS 82 10/03/2012 0524   ALKPHOS 111* 04/29/2011 1201   AST 32 06/19/2013 1246   AST 21 10/03/2012 0524   AST 30 04/29/2011 1201   ALT 22 06/19/2013 1246   ALT 21 10/03/2012 0524   ALT 27 04/29/2011 1201   BILITOT 0.48 06/19/2013 1246   BILITOT 0.3 10/03/2012 0524   BILITOT 0.60 04/29/2011 1201       Radiological Studies: Dg Chest 2 View  06/17/2013   CLINICAL DATA:  Two weeks of cough, nonsmoker  EXAM: CHEST  2 VIEW  COMPARISON:  CT CHEST W/O CM dated 05/12/2013; CT CHEST W/O CM dated 04/29/2011; NM PET IMAGE RESTAG (PS) SKULL BASE TO THIGH dated 05/31/2013  FINDINGS: Grossly unchanged cardiac silhouette and mediastinal contours post median sternotomy and CABG. Atherosclerotic plaque within a mildly tortuous thoracic aorta. The lungs appear hyperexpanded with mild diffuse slightly nodular thickening of the pulmonary interstitium. No discrete focal airspace opacities. No pleural effusion or pneumothorax. No evidence of edema. Unchanged bones.  IMPRESSION: Hyperexpanded lungs without acute cardiopulmonary disease.   Electronically Signed   By: Sandi Mariscal M.D.   On: 06/17/2013 15:09   Nm Pet Image Restag (ps) Skull Base To Thigh  05/31/2013   CLINICAL DATA:  Subsequent treatment strategy for lymphoma. Peripheral  T-cell lymphoma of the inguinal region. Suspicion for recurrence in the right inguinal region.  EXAM: NUCLEAR MEDICINE PET SKULL BASE TO THIGH  FASTING BLOOD GLUCOSE:  Value: 9.0 mg/dl  TECHNIQUE: 8.0 mCi F-18 FDG was injected intravenously. Full-ring PET imaging was performed from the skull base to thigh after the radiotracer. CT data was obtained and used for attenuation correction and anatomic localization.  COMPARISON:  NM PET IMAGE RESTAG (PS) SKULL BASE TO THIGH dated 04/29/2010  FINDINGS: NECK  No hypermetabolic lymph nodes in the neck.  CHEST  No hypermetabolic mediastinal or hilar nodes. There are several scattered small sub 5 mm pulmonary nodules within the periphery of the left and right lung most consistent with benign infectious or inflammatory process. This is similar to comparison exam.  ABDOMEN/PELVIS  No abnormal hypermetabolic activity in the liver. No hypermetabolic abdominal pelvic lymph nodes.  Within the right  inguinal region, there is a hypermetabolic mass lesion measuring 3.7 x 2.8 cm with SUV max 34. Single smaller adjacent hypermetabolic lymph node measuring 8 mm (image 23).  SKELETON  No focal hypermetabolic activity to suggest skeletal metastasis.  IMPRESSION: 1. Hypermetabolic mass within the right inguinal region and small adjacent lymph node are consistent with lymphoma recurrence. 2. No evidence of more distant metastatic recurrence.   Electronically Signed   By: Suzy Bouchard M.D.   On: 05/31/2013 16:34   [place] Core Biopsy  06/16/2013   CLINICAL DATA:  Right groin mass  EXAM: ULTRASOUND-GUIDED BIOPSY OF A RIGHT GROIN MASS.  CORE.  MEDICATIONS AND MEDICAL HISTORY: Versed two mg, Fentanyl 200 mcg.  Additional Medications: None.  ANESTHESIA/SEDATION: Moderate sedation time: 15 minutes  PROCEDURE: The procedure, risks, benefits, and alternatives were explained to the patient. Questions regarding the procedure were encouraged and answered. The patient understands and consents to the  procedure.  The right groin was prepped with Betadine in a sterile fashion, and a sterile drape was applied covering the operative field. A sterile gown and sterile gloves were used for the procedure.  Under sonographic guidance, six 18 gauge core biopsies of the right groin mass were obtained. Final imaging was performed.  Patient tolerated the procedure well without complication. Vital sign monitoring by nursing staff during the procedure will continue as patient is in the special procedures unit for post procedure observation.  FINDINGS: The images document guide needle placement within the right groin mass. Post biopsy images demonstrate no mm.  IMPRESSION: Successful ultrasound-guided core biopsy of a right groin mass.   Electronically Signed   By: Maryclare Bean M.D.   On: 06/16/2013 08:41    Impression:  Unfortunately, nondiagnostic inguinal lymph node biopsy. Clinical suspicion for recurrent lymphoma remains high. I am going to get him back for a repeat biopsy of the soft tissue mass - I will call  interventional radiologist to discuss the case.    CC: Patient Care Team: Horatio Pel, MD as PCP - General (Internal Medicine)   Annia Belt, MD 3/2/20155:11 PM

## 2013-06-19 NOTE — Telephone Encounter (Signed)
gv pt appt schedule for march - [facility] will call re bx appt.

## 2013-06-20 ENCOUNTER — Encounter (HOSPITAL_COMMUNITY): Payer: Self-pay | Admitting: Pharmacy Technician

## 2013-06-20 ENCOUNTER — Other Ambulatory Visit: Payer: Self-pay | Admitting: Radiology

## 2013-06-21 ENCOUNTER — Other Ambulatory Visit: Payer: Self-pay | Admitting: Oncology

## 2013-06-21 ENCOUNTER — Encounter (HOSPITAL_COMMUNITY): Payer: Self-pay

## 2013-06-21 ENCOUNTER — Ambulatory Visit (HOSPITAL_COMMUNITY)
Admission: RE | Admit: 2013-06-21 | Discharge: 2013-06-21 | Disposition: A | Discharge status: Home / Self Care | Payer: Medicare Other | Source: Ambulatory Visit | Attending: Oncology | Admitting: Oncology

## 2013-06-21 DIAGNOSIS — I059 Rheumatic mitral valve disease, unspecified: Secondary | ICD-10-CM | POA: Insufficient documentation

## 2013-06-21 DIAGNOSIS — Z9484 Stem cells transplant status: Secondary | ICD-10-CM | POA: Insufficient documentation

## 2013-06-21 DIAGNOSIS — Z86718 Personal history of other venous thrombosis and embolism: Secondary | ICD-10-CM | POA: Insufficient documentation

## 2013-06-21 DIAGNOSIS — E785 Hyperlipidemia, unspecified: Secondary | ICD-10-CM | POA: Insufficient documentation

## 2013-06-21 DIAGNOSIS — N183 Chronic kidney disease, stage 3 unspecified: Secondary | ICD-10-CM | POA: Insufficient documentation

## 2013-06-21 DIAGNOSIS — I129 Hypertensive chronic kidney disease with stage 1 through stage 4 chronic kidney disease, or unspecified chronic kidney disease: Secondary | ICD-10-CM | POA: Insufficient documentation

## 2013-06-21 DIAGNOSIS — C8589 Other specified types of non-Hodgkin lymphoma, extranodal and solid organ sites: Secondary | ICD-10-CM | POA: Insufficient documentation

## 2013-06-21 DIAGNOSIS — I2510 Atherosclerotic heart disease of native coronary artery without angina pectoris: Secondary | ICD-10-CM | POA: Insufficient documentation

## 2013-06-21 DIAGNOSIS — C8445 Peripheral T-cell lymphoma, not classified, lymph nodes of inguinal region and lower limb: Secondary | ICD-10-CM

## 2013-06-21 DIAGNOSIS — Z87891 Personal history of nicotine dependence: Secondary | ICD-10-CM | POA: Insufficient documentation

## 2013-06-21 DIAGNOSIS — Z9221 Personal history of antineoplastic chemotherapy: Secondary | ICD-10-CM | POA: Insufficient documentation

## 2013-06-21 DIAGNOSIS — Z951 Presence of aortocoronary bypass graft: Secondary | ICD-10-CM | POA: Insufficient documentation

## 2013-06-21 LAB — CBC
HCT: 34.5 % — ABNORMAL LOW (ref 39.0–52.0)
Hemoglobin: 11.3 g/dL — ABNORMAL LOW (ref 13.0–17.0)
MCH: 31.5 pg (ref 26.0–34.0)
MCHC: 32.8 g/dL (ref 30.0–36.0)
MCV: 96.1 fL (ref 78.0–100.0)
PLATELETS: 362 10*3/uL (ref 150–400)
RBC: 3.59 MIL/uL — ABNORMAL LOW (ref 4.22–5.81)
RDW: 13.6 % (ref 11.5–15.5)
WBC: 14.6 10*3/uL — ABNORMAL HIGH (ref 4.0–10.5)

## 2013-06-21 LAB — PROTIME-INR
INR: 1.03 (ref 0.00–1.49)
Prothrombin Time: 13.3 seconds (ref 11.6–15.2)

## 2013-06-21 LAB — APTT: aPTT: 30 seconds (ref 24–37)

## 2013-06-21 MED ORDER — FENTANYL CITRATE 0.05 MG/ML IJ SOLN
INTRAMUSCULAR | Status: AC | PRN
  Administered 2013-06-21: 50 ug via INTRAVENOUS

## 2013-06-21 MED ORDER — FENTANYL CITRATE 0.05 MG/ML IJ SOLN
INTRAMUSCULAR | Status: DC | PRN
  Administered 2013-06-21: 50 ug via INTRAVENOUS

## 2013-06-21 MED ORDER — FENTANYL CITRATE 0.05 MG/ML IJ SOLN
INTRAMUSCULAR | Status: AC
  Filled 2013-06-21: qty 6

## 2013-06-21 MED ORDER — MIDAZOLAM HCL 2 MG/2ML IJ SOLN
INTRAMUSCULAR | Status: AC
  Filled 2013-06-21: qty 6

## 2013-06-21 MED ORDER — SODIUM CHLORIDE 0.9 % IV SOLN
INTRAVENOUS | Status: DC
  Administered 2013-06-21: 20 mL/h via INTRAVENOUS

## 2013-06-21 MED ORDER — MIDAZOLAM HCL 2 MG/2ML IJ SOLN
INTRAMUSCULAR | Status: AC | PRN
  Administered 2013-06-21 (×2): 1 mg via INTRAVENOUS

## 2013-06-21 NOTE — Discharge Instructions (Signed)
Needle Biopsy °Care After °These instructions give you information on caring for yourself after your procedure. Your doctor may also give you more specific instructions. Call your doctor if you have any problems or questions after your procedure. °HOME CARE °· Rest for 4 hours after your biopsy, except for getting up to go to the bathroom or as told. °· Keep the places where the needles were put in clean and dry. °· Do not put powder or lotion on the sites. °· Do not shower until 24 hours after the test. Remove all bandages (dressings) before showering. °· Remove all bandages at least once every day. Gently clean the sites with soap and water. Keep putting a new bandage on until the skin is closed. °Finding out the results of your test °Ask your doctor when your test results will be ready. Make sure you follow up and get the test results. °GET HELP RIGHT AWAY IF:  °· You have shortness of breath or trouble breathing. °· You have pain or cramping in your belly (abdomen). °· You feel sick to your stomach (nauseous) or throw up (vomit). °· Any of the places where the needles were put in: °· Are puffy (swollen) or red. °· Are sore or hot to the touch. °· Are draining yellowish-white fluid (pus). °· Are bleeding after 10 minutes of pressing down on the site. Have someone keep pressing on any place that is bleeding until you see a doctor. °· You have any unusual pain that will not stop. °· You have a fever. °If you go to the emergency room, tell the nurse that you had a biopsy. Take this paper with you to show the nurse. °MAKE SURE YOU:  °· Understand these instructions. °· Will watch your condition. °· Will get help right away if you are not doing well or get worse. °Document Released: 03/19/2008 Document Revised: 06/29/2011 Document Reviewed: 03/19/2008 °[employer]® Patient Information ©2014 [employer]. °Moderate Sedation, Adult °Moderate sedation is given to help you relax or even sleep through a procedure. You may  remain sleepy, be clumsy, or have poor balance for several hours following this procedure. Arrange for a responsible adult, family member, or friend to take you home. A responsible adult should stay with you for at least 24 hours or until the medicines have worn off. °· Do not participate in any activities where you could become injured for the next 24 hours, or until you feel normal again. Do not: °· Drive. °· Swim. °· Ride a bicycle. °· Operate heavy machinery. °· Cook. °· Use power tools. °· Climb ladders. °· Work at heights. °· Do not make important decisions or sign legal documents until you are improved. °· Vomiting may occur if you eat too soon. When you can drink without vomiting, try water, juice, or soup. Try solid foods if you feel little or no nausea. °· Only take over-the-counter or prescription medications for pain, discomfort, or fever as directed by your caregiver.If pain medications have been prescribed for you, ask your caregiver how soon it is safe to take them. °· Make sure you and your family fully understands everything about the medication given to you. Make sure you understand what side effects may occur. °· You should not drink alcohol, take sleeping pills, or medications that cause drowsiness for at least 24 hours. °· If you smoke, do not smoke alone. °· If you are feeling better, you may resume normal activities 24 hours after receiving sedation. °· Keep all appointments as scheduled.   Follow all instructions. °· Ask questions if you do not understand. °SEEK MEDICAL CARE IF:  °· Your skin is pale or bluish in color. °· You continue to feel sick to your stomach (nauseous) or throw up (vomit). °· Your pain is getting worse and not helped by medication. °· You have bleeding or swelling. °· You are still sleepy or feeling clumsy after 24 hours. °SEEK IMMEDIATE MEDICAL CARE IF:  °· You develop a rash. °· You have difficulty breathing. °· You develop any type of allergic problem. °· You have a  fever. °Document Released: 12/30/2000 Document Revised: 06/29/2011 Document Reviewed: 12/12/2012 °[employer]® Patient Information ©2014 [employer]. ° °

## 2013-06-21 NOTE — Procedures (Signed)
Successful RT INGUINAL MASS 18G CORE BXS NO COMP STABLE PATH PENDING

## 2013-06-21 NOTE — H&P (Signed)
Willie [name] is an 73 y.o. male.   Chief Complaint: right groin mass HPI: Patient with past history of T cell lymphoma/NHL and now with hypermetabolic right inguinal mass/adenopathy on PET . He is s/p US guided core biopsy of right inguinal mass/adenopathy on 06/16/13 which revealed reactive lymphoid tissue/no malignancy. Due to high suspicion for lymphoma he presents today for repeat US guided core biopsy of the right inguinal mass.   Past Medical History  Diagnosis Date  . Hypertension   . History of DVT of lower extremity     06/2001 as first sign of lymphoma: enlarged left inguinal nodes  . Low serum testosterone level 11/10/2011  . 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 Nocturia 11/10/2011  . Normochromic anemia 11/10/2011    Likely result of prior high dose chemotherapy  . Unexplained weight loss 05/03/2012  . Mitral valvular regurgitation 06/01/12    moderate to severe by echo- asymptomatic  . Dyslipidemia   . RBBB   . Coronary artery disease   . Family history of anesthesia complication     "son, Margreta Journey, larynx collapses" (10/11/2012)  . Heart murmur   . History of blood transfusion 2009-2010    "both related to his chemo" (10/11/2012)  . Chronic renal insufficiency, stage III (moderate)   . Non Hodgkin's lymphoma 2003; 2009    "chemo; stem cell transplant & high powered chemo" (10/10/2012)  . Renal insufficiency   . Deep inguinal pain, right 05/26/2013    Past Surgical History  Procedure Laterality Date  . Bone marrow transplant  2010  . Cardiac catheterization  10/03/2012; 2007  . Coronary angioplasty with stent placement  10/11/2012    "2" (10/11/2012)  . Tonsillectomy  1950's  . Cataract extraction w/ intraocular lens implant Left 1990's  . Coronary artery bypass graft  03/29/2006    "CABG X5" (10/11/2012)    Family History  Problem Relation Age of Onset  . Heart failure Father   . Leukemia Brother 21  . Cancer - Lung Mother    Social History:  reports  that he quit smoking about 44 years ago. His smoking use included Cigarettes. He has a 6.5 pack-year smoking history. He has never used smokeless tobacco. He reports that he does not drink alcohol or use illicit drugs.  Allergies:  Allergies  Allergen Reactions  . Niaspan [Niacin Er] Rash     (Not in a hospital admission)  Results for orders placed during the hospital encounter of 06/21/13 (from the past 48 hour(s))  APTT     Status: None   Collection Time    06/21/13 11:15 AM      Result Value Ref Range   aPTT 30  24 - 37 seconds  CBC     Status: Abnormal   Collection Time    06/21/13 11:15 AM      Result Value Ref Range   WBC 14.6 (*) 4.0 - 10.5 K/uL   RBC 3.59 (*) 4.22 - 5.81 MIL/uL   Hemoglobin 11.3 (*) 13.0 - 17.0 g/dL   HCT 34.5 (*) 39.0 - 52.0 %   MCV 96.1  78.0 - 100.0 fL   MCH 31.5  26.0 - 34.0 pg   MCHC 32.8  30.0 - 36.0 g/dL   RDW 13.6  11.5 - 15.5 %   Platelets 362  150 - 400 K/uL  PROTIME-INR     Status: None   Collection Time    06/21/13 11:15 AM      Result  Value Ref Range   Prothrombin Time 13.3  11.6 - 15.2 seconds   INR 1.03  0.00 - 1.49   No results found.  Review of Systems  Constitutional: Positive for weight loss. Negative for fever and chills.  Respiratory: Negative for cough and shortness of breath.   Cardiovascular: Negative for chest pain.  Gastrointestinal: Negative for nausea and vomiting.  Genitourinary: Negative for hematuria.  Musculoskeletal: Positive for neck pain. Negative for back pain.       Rt inguinal/groin pain with radiation down right leg secondary to inguinal mass  Skin: Positive for rash.  Neurological: Negative for headaches.  Endo/Heme/Allergies: Does not bruise/bleed easily.    Blood pressure 101/59, pulse 104, temperature 97.7 F (36.5 C), temperature source Oral, resp. rate 16, height 5\' 9"  (1.753 m), weight 129 lb (58.514 kg), SpO2 100.00%. Physical Exam  Constitutional: He is oriented to person, place, and time.   Thin WM in NAD  Cardiovascular:  Murmur heard. Tachy but regular rhythm  Respiratory: Effort normal and breath sounds normal.  GI: Soft. Bowel sounds are normal.  Prominent rt inguinal mass/adenopathy with adjacent erythema/desquamation of skin surface  Musculoskeletal: Normal range of motion.  Trace RLE edema  Neurological: He is alert and oriented to person, place, and time.     Assessment/Plan Patient with past history of T cell lymphoma/NHL and now with hypermetabolic right inguinal mass/adenopathy on PET . He is s/p US guided core biopsy of right inguinal mass/adenopathy on 06/16/13 which revealed reactive lymphoid tissue/no malignancy. Due to high suspicion for lymphoma he presents today for repeat US guided core biopsy of the right inguinal mass. Details/risks of procedure d/w pt/wife with their understanding and consent.   Keyauna Graefe,D KEVIN 06/21/2013, 12:57 PM

## 2013-06-23 ENCOUNTER — Telehealth: Payer: Self-pay | Admitting: *Deleted

## 2013-06-23 ENCOUNTER — Other Ambulatory Visit: Payer: Self-pay | Admitting: *Deleted

## 2013-06-23 ENCOUNTER — Telehealth: Payer: Self-pay | Admitting: Oncology

## 2013-06-23 NOTE — Telephone Encounter (Signed)
Called pt per Dr. Azucena Freed request & informed that biopsy positive & needs to start chemo with Brentuximab next week.  Discussed drug & side effects & reasons to call office.  Pt will see Dr Beryle Beams before treatment to answer questions & we will give him written information.  Pt expressed understanding.

## 2013-06-23 NOTE — Telephone Encounter (Signed)
per 3/6 pof added inf after 3/11 md visit. start time remains the same

## 2013-06-25 ENCOUNTER — Other Ambulatory Visit: Payer: Self-pay | Admitting: Oncology

## 2013-06-25 LAB — CULTURE, BLOOD (SINGLE)

## 2013-06-26 ENCOUNTER — Telehealth: Payer: Self-pay | Admitting: Oncology

## 2013-06-26 NOTE — Telephone Encounter (Signed)
s/w pt wife re appt for 3/10 ched. wife will get new schedule tomorrow.

## 2013-06-27 ENCOUNTER — Encounter: Payer: Self-pay | Admitting: *Deleted

## 2013-06-27 ENCOUNTER — Telehealth: Payer: Self-pay | Admitting: Oncology

## 2013-06-27 ENCOUNTER — Other Ambulatory Visit: Payer: Medicare Other

## 2013-06-27 ENCOUNTER — Telehealth: Payer: Self-pay | Admitting: *Deleted

## 2013-06-27 ENCOUNTER — Other Ambulatory Visit: Payer: Self-pay | Admitting: Oncology

## 2013-06-27 MED ORDER — DEXAMETHASONE 4 MG PO TABS: ORAL_TABLET | ORAL | Status: DC

## 2013-06-27 MED ORDER — ONDANSETRON 8 MG PO TBDP: ORAL_TABLET | ORAL | Status: DC

## 2013-06-27 NOTE — Telephone Encounter (Signed)
Gave pt apt for Lab,md and chemo class today

## 2013-06-27 NOTE — Telephone Encounter (Signed)
Received vm call from pt' wife stating that she wanted to know if there are any prescriptions that should be filled before chemo tomorrow.  Returned call & she asked also if there is anything that can be given to boost pt's energy level & appetite,  She also reports that pt is coughing & asked for suggestions for this.  She states that he has finished everything that was prescribed for him.  She says that his eyes are red & doesn't feel like he can see as well although this was reported at last visit to Dr Beryle Beams.  Note to Dr.Granfortuna.

## 2013-06-28 ENCOUNTER — Encounter: Payer: Self-pay | Admitting: Oncology

## 2013-06-28 ENCOUNTER — Ambulatory Visit (HOSPITAL_BASED_OUTPATIENT_CLINIC_OR_DEPARTMENT_OTHER): Payer: Medicare Other

## 2013-06-28 ENCOUNTER — Telehealth: Payer: Self-pay | Admitting: Oncology

## 2013-06-28 ENCOUNTER — Ambulatory Visit (HOSPITAL_BASED_OUTPATIENT_CLINIC_OR_DEPARTMENT_OTHER): Payer: Medicare Other | Admitting: Oncology

## 2013-06-28 VITALS — BP 99/62 | HR 98 | Temp 97.9°F | Resp 18 | Ht 69.0 in | Wt 125.2 lb

## 2013-06-28 DIAGNOSIS — H1031 Unspecified acute conjunctivitis, right eye: Secondary | ICD-10-CM | POA: Insufficient documentation

## 2013-06-28 DIAGNOSIS — H109 Unspecified conjunctivitis: Secondary | ICD-10-CM

## 2013-06-28 DIAGNOSIS — IMO0002 Reserved for concepts with insufficient information to code with codable children: Secondary | ICD-10-CM

## 2013-06-28 DIAGNOSIS — J42 Unspecified chronic bronchitis: Secondary | ICD-10-CM

## 2013-06-28 DIAGNOSIS — R634 Abnormal weight loss: Secondary | ICD-10-CM

## 2013-06-28 DIAGNOSIS — C8445 Peripheral T-cell lymphoma, not classified, lymph nodes of inguinal region and lower limb: Secondary | ICD-10-CM

## 2013-06-28 DIAGNOSIS — Z86718 Personal history of other venous thrombosis and embolism: Secondary | ICD-10-CM

## 2013-06-28 DIAGNOSIS — I059 Rheumatic mitral valve disease, unspecified: Secondary | ICD-10-CM

## 2013-06-28 DIAGNOSIS — Z5112 Encounter for antineoplastic immunotherapy: Secondary | ICD-10-CM

## 2013-06-28 DIAGNOSIS — J40 Bronchitis, not specified as acute or chronic: Secondary | ICD-10-CM

## 2013-06-28 DIAGNOSIS — R64 Cachexia: Secondary | ICD-10-CM

## 2013-06-28 HISTORY — DX: Unspecified acute conjunctivitis, right eye: H10.31

## 2013-06-28 MED ORDER — ONDANSETRON 8 MG/50ML IVPB (CHCC)
8.0000 mg | Freq: Once | INTRAVENOUS | Status: AC
  Administered 2013-06-28: 8 mg via INTRAVENOUS

## 2013-06-28 MED ORDER — DEXAMETHASONE SODIUM PHOSPHATE 10 MG/ML IJ SOLN
INTRAMUSCULAR | Status: AC
  Filled 2013-06-28: qty 1

## 2013-06-28 MED ORDER — DEXAMETHASONE SODIUM PHOSPHATE 10 MG/ML IJ SOLN
10.0000 mg | Freq: Once | INTRAMUSCULAR | Status: AC
  Administered 2013-06-28: 10 mg via INTRAVENOUS

## 2013-06-28 MED ORDER — ACETAMINOPHEN 325 MG PO TABS
650.0000 mg | ORAL_TABLET | Freq: Once | ORAL | Status: AC
  Administered 2013-06-28: 650 mg via ORAL

## 2013-06-28 MED ORDER — DIPHENHYDRAMINE HCL 50 MG/ML IJ SOLN
50.0000 mg | Freq: Once | INTRAMUSCULAR | Status: AC
Start: 2013-06-28 — End: 2013-06-28
  Administered 2013-06-28: 50 mg via INTRAVENOUS

## 2013-06-28 MED ORDER — ACETAMINOPHEN 325 MG PO TABS
ORAL_TABLET | ORAL | Status: AC
  Filled 2013-06-28: qty 2

## 2013-06-28 MED ORDER — DIPHENHYDRAMINE HCL 50 MG/ML IJ SOLN
INTRAMUSCULAR | Status: AC
  Filled 2013-06-28: qty 1

## 2013-06-28 MED ORDER — VALACYCLOVIR HCL 1 G PO TABS: ORAL_TABLET | ORAL | Status: DC

## 2013-06-28 MED ORDER — ONDANSETRON 8 MG/NS 50 ML IVPB
INTRAVENOUS | Status: AC
  Filled 2013-06-28: qty 8

## 2013-06-28 MED ORDER — SODIUM CHLORIDE 0.9 % IV SOLN
100.0000 mg | Freq: Once | INTRAVENOUS | Status: AC
  Administered 2013-06-28: 100 mg via INTRAVENOUS
  Filled 2013-06-28: qty 20

## 2013-06-28 MED ORDER — SODIUM CHLORIDE 0.9 % IV SOLN
Freq: Once | INTRAVENOUS | Status: AC
  Administered 2013-06-28: 12:00:00 via INTRAVENOUS

## 2013-06-28 NOTE — Progress Notes (Signed)
Hematology and Oncology Follow Up Visit  CLIFFORD BENNINGER [id number] 07/08/1940 73 y.o. 06/28/2013 7:04 PM   Principle Diagnosis: Encounter Diagnosis  Name Primary?  . 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 The nondiagnostic needle biopsy of a right deep inguinal node on February 26. I reviewed his scans again with the radiologist. I had them do another biopsy of the indurated soft tissues in the right inguinal region that are hypermetabolic on the PET scan. Biopsy done on 06/21/2013 diagnostic for recurrent CD30-positive, ALK positive, recurrent T-cell lymphoma. He continues to have induration and erythema in the right inguinal region. He continues to have anorexia and weight loss. He has developed a acute conjunctivitis of his right eye. He continues to have a hacking nonproductive cough without fever. No uptake in the lungs on PET/CT done to rule out in 2015. I suspect he has contracted the viral bronchitis that is circulating in our community for the last few months.   Medications: reviewed  Allergies:  Allergies  Allergen Reactions  . Niaspan [Niacin Er] Rash    Review of Systems: Hematology:  No bleeding or bruising ENT ROS: No sore throat Breast ROS:  Respiratory ROS: Persistent hacking dry cough Cardiovascular ROS:  No chest pain or palpitations Gastrointestinal ROS: No abdominal pain or change in bowel habit   Genito-Urinary ROS: Not questioned Musculoskeletal ROS: No bone or joint pain Neurological ROS: No headache or change in vision Dermatological ROS: Persistent inflammatory dermatitis right inguinal region Remaining ROS negative:   Physical Exam: Blood pressure 99/62, pulse 98, temperature 97.9 F (36.6 C), temperature source Oral, resp. rate 18, height $RemoveBe'5\' 9"'auvhNPSDU$  (1.753 m), weight 125 lb 3.2 oz (56.79 kg), SpO2 98.00%. Wt Readings from Last  3 Encounters:  06/28/13 125 lb 3.2 oz (56.79 kg)  06/21/13 129 lb (58.514 kg)  06/19/13 129 lb 4.8 oz (58.65 kg)     General appearance: Cachectic Caucasian man HENNT: Conjunctival irritation right eye no exudate. Pharynx no erythema, exudate, mass, or ulcer. No thyromegaly or thyroid nodules Lymph nodes: No cervical, supraclavicular, or axillary lymphadenopathy Breasts:  Lungs: Clear to auscultation, resonant to percussion throughout Heart: Regular rhythm, no murmur, no gallop, no rub, no click, no edema Abdomen: Soft, nontender, normal bowel sounds, no mass, no organomegaly Extremities: No edema, no calf tenderness Musculoskeletal: no joint deformities GU: Persistent 20 x 20 cm area of erythema with desquamation and induration of the soft tissues following the right inguinal ligament Vascular: Carotid pulses 2+, no bruits, distal pulses: Dorsalis pedis 1+ symmetric Neurologic: Alert, oriented, PERRLA, optic discs sharp and vessels normal, no hemorrhage or exudate, cranial nerves grossly normal, motor strength 5 over 5, reflexes 1+ symmetric, upper body coordination normal, gait normal, Skin: No rash or ecchymosis  Lab Results: CBC W/Diff    Component Value Date/Time   WBC 14.6* 06/21/2013 1115   WBC 12.3* 06/19/2013 1246   RBC 3.59* 06/21/2013 1115   RBC 3.43* 06/19/2013 1246   HGB 11.3* 06/21/2013 1115   HGB 10.7* 06/19/2013 1246   HCT 34.5* 06/21/2013 1115   HCT 32.9* 06/19/2013 1246   PLT 362 06/21/2013 1115   PLT 357 06/19/2013 1246   MCV 96.1 06/21/2013 1115   MCV 96.1 06/19/2013 1246   MCH 31.5 06/21/2013 1115   MCH 31.2 06/19/2013 1246   MCHC 32.8 06/21/2013 1115   MCHC 32.4 06/19/2013 1246  RDW 13.6 06/21/2013 1115   RDW 13.5 06/19/2013 1246   LYMPHSABS 1.0 06/19/2013 1246   LYMPHSABS 0.8 01/24/2008 1842   MONOABS 1.1* 06/19/2013 1246   MONOABS 0.8 01/24/2008 1842   EOSABS 0.1 06/19/2013 1246   EOSABS 0.2 01/24/2008 1842   BASOSABS 0.0 06/19/2013 1246   BASOSABS 0.0 01/24/2008 1842     Chemistry       Component Value Date/Time   NA 135* 06/19/2013 1246   NA 137 10/12/2012 0521   NA 143 04/29/2011 1201   K 3.7 06/19/2013 1246   K 3.8 06/15/2013 1030   K 4.2 04/29/2011 1201   CL 105 10/12/2012 0521   CL 106 05/03/2012 1149   CL 102 04/29/2011 1201   CO2 25 06/19/2013 1246   CO2 26 10/12/2012 0521   CO2 29 04/29/2011 1201   BUN 14.6 06/19/2013 1246   BUN 15 10/12/2012 0521   BUN 15 04/29/2011 1201   CREATININE 1.5* 06/19/2013 1246   CREATININE 1.51* 10/12/2012 0521   CREATININE 1.3* 04/29/2011 1201      Component Value Date/Time   CALCIUM 9.8 06/19/2013 1246   CALCIUM 9.0 10/12/2012 0521   CALCIUM 9.2 04/29/2011 1201   ALKPHOS 94 06/19/2013 1246   ALKPHOS 82 10/03/2012 0524   ALKPHOS 111* 04/29/2011 1201   AST 32 06/19/2013 1246   AST 21 10/03/2012 0524   AST 30 04/29/2011 1201   ALT 22 06/19/2013 1246   ALT 21 10/03/2012 0524   ALT 27 04/29/2011 1201   BILITOT 0.48 06/19/2013 1246   BILITOT 0.3 10/03/2012 0524   BILITOT 0.60 04/29/2011 1201       Radiological Studies: Dg Chest 2 View  06/17/2013   CLINICAL DATA:  Two weeks of cough, nonsmoker  EXAM: CHEST  2 VIEW  COMPARISON:  CT CHEST W/O CM dated 05/12/2013; CT CHEST W/O CM dated 04/29/2011; NM PET IMAGE RESTAG (PS) SKULL BASE TO THIGH dated 05/31/2013  FINDINGS: Grossly unchanged cardiac silhouette and mediastinal contours post median sternotomy and CABG. Atherosclerotic plaque within a mildly tortuous thoracic aorta. The lungs appear hyperexpanded with mild diffuse slightly nodular thickening of the pulmonary interstitium. No discrete focal airspace opacities. No pleural effusion or pneumothorax. No evidence of edema. Unchanged bones.  IMPRESSION: Hyperexpanded lungs without acute cardiopulmonary disease.   Electronically Signed   By: Sandi Mariscal M.D.   On: 06/17/2013 15:09   Nm Pet Image Restag (ps) Skull Base To Thigh  05/31/2013   CLINICAL DATA:  Subsequent treatment strategy for lymphoma. Peripheral T-cell lymphoma of the inguinal region. Suspicion for recurrence in  the right inguinal region.  EXAM: NUCLEAR MEDICINE PET SKULL BASE TO THIGH  FASTING BLOOD GLUCOSE:  Value: 9.0 mg/dl  TECHNIQUE: 8.0 mCi F-18 FDG was injected intravenously. Full-ring PET imaging was performed from the skull base to thigh after the radiotracer. CT data was obtained and used for attenuation correction and anatomic localization.  COMPARISON:  NM PET IMAGE RESTAG (PS) SKULL BASE TO THIGH dated 04/29/2010  FINDINGS: NECK  No hypermetabolic lymph nodes in the neck.  CHEST  No hypermetabolic mediastinal or hilar nodes. There are several scattered small sub 5 mm pulmonary nodules within the periphery of the left and right lung most consistent with benign infectious or inflammatory process. This is similar to comparison exam.  ABDOMEN/PELVIS  No abnormal hypermetabolic activity in the liver. No hypermetabolic abdominal pelvic lymph nodes.  Within the right inguinal region, there is a hypermetabolic mass lesion measuring 3.7 x 2.8  cm with SUV max 34. Single smaller adjacent hypermetabolic lymph node measuring 8 mm (image 23).  SKELETON  No focal hypermetabolic activity to suggest skeletal metastasis.  IMPRESSION: 1. Hypermetabolic mass within the right inguinal region and small adjacent lymph node are consistent with lymphoma recurrence. 2. No evidence of more distant metastatic recurrence.   Electronically Signed   By: Suzy Bouchard M.D.   On: 05/31/2013 16:34   [place] Core Biopsy  06/16/2013   CLINICAL DATA:  Right groin mass  EXAM: ULTRASOUND-GUIDED BIOPSY OF A RIGHT GROIN MASS.  CORE.  MEDICATIONS AND MEDICAL HISTORY: Versed two mg, Fentanyl 200 mcg.  Additional Medications: None.  ANESTHESIA/SEDATION: Moderate sedation time: 15 minutes  PROCEDURE: The procedure, risks, benefits, and alternatives were explained to the patient. Questions regarding the procedure were encouraged and answered. The patient understands and consents to the procedure.  The right groin was prepped with Betadine in a sterile  fashion, and a sterile drape was applied covering the operative field. A sterile gown and sterile gloves were used for the procedure.  Under sonographic guidance, six 18 gauge core biopsies of the right groin mass were obtained. Final imaging was performed.  Patient tolerated the procedure well without complication. Vital sign monitoring by nursing staff during the procedure will continue as patient is in the special procedures unit for post procedure observation.  FINDINGS: The images document guide needle placement within the right groin mass. Post biopsy images demonstrate no mm.  IMPRESSION: Successful ultrasound-guided core biopsy of a right groin mass.   Electronically Signed   By: Maryclare Bean M.D.   On: 06/16/2013 08:41   US Biopsy  06/21/2013   CLINICAL DATA:  HISTORY OF LYMPHOMA, RIGHT INGUINAL INFILTRATIVE MASS WHICH IS PET POSITIVE.  EXAM: ULTRASOUND GUIDED CORE BIOPSY OF RIGHT INGUINAL INFILTRATIVE MASS  MEDICATIONS: 2 mg IV Versed; 100 mcg IV Fentanyl  Total Moderate Sedation Time: 15 MIN  PROCEDURE: The procedure, risks, benefits, and alternatives were explained to the patient. Questions regarding the procedure were encouraged and answered. The patient understands and consents to the procedure.  The RIGHT INGUINAL REGION was prepped with BETADINE in a sterile fashion, and a sterile drape was applied covering the operative field. A sterile gown and sterile gloves were used for the procedure. Local anesthesia was provided with 1% Lidocaine.  Previous imaging reviewed. Preliminary ultrasound performed of the right inguinal region. The ill-defined hypoechoic infiltrative mass is demonstrated anterior to the femoral vessels. Under sterile conditions and local anesthesia, an 18 gauge core biopsy needle was advanced into the right inguinal infiltrative mass. Needle position confirmed with ultrasound. Five core biopsies obtained and placed in saline. No immediate complication. Patient tolerated the biopsy  well.  COMPLICATIONS: None.  FINDINGS: Imaging confirms needle placement in the right inguinal infiltrative mass  IMPRESSION: Successful ultrasound right inguinal infiltrative mass 18 gauge core biopsy   Electronically Signed   By: Daryll Brod M.D.   On: 06/21/2013 15:33    Impression:  #1. 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 He had an initial complete response to 6 cycles of CHOP chemotherapy. He relapsed October 2009 with extensive infiltration of tumor into the right iliopsoas muscle presenting as an erythematous nodular cutaneous mass in the right inguinal region. No other disease outside of the muscle noted on PET scan. He again achieved remission with ICE  chemotherapy and then went onto receive BCNU, etoposide and Cytoxan conditioning followed by autologous stem cell transplant at [facility] in January 2010. He denies an isolated recurrence in the soft tissues of the right inguinal region.  I'm going to start him on a trial of Brentuximab 1.8 mg per kilogram IV every 3 weeks. I reviewed potential side effects with him and his family today. Care plan stipulates Decadron 4 mg twice a day and Zofran 8 mg twice a day for 2 days starting the day after the chemotherapy treatment. I will not add Neulasta unless necessary. We will get him back to assess tolerance and check a nadir count in 2 weeks.  #2. Persistent bronchitis. Probably viral. Although this is likely an influenza strain, he has had symptoms for a number of weeks so I don't think Tamiflu will be helpful. I'm going to put him on Valtrex 1 g by mouth twice a day for 7 days and then maintenance dose 500 mg daily while he is on chemotherapy. I will check a peripheral blood PCR for CMV.  #3. Right conjunctivitis This also appears viral to me. He will be in touch  with his ophthalmologist within the next one to 4 hours to schedule an appointment for further evaluation and treatment.  #4. Cachexia. My clinical impression is that this is related to be indolent recurrence of his lymphoma since the weight loss has been going on over at least a one-year interval. No other obvious primary tumor found on PET scan. For the time being, I'm going to keep him on Decadron 4 mg daily as an appetite stimulant after the 16 mg dose given for antiemesis x2 days after the chemotherapy.  #5. Coronary artery disease status post 2 vessel stents June 2014  #6. Moderate to severe mitral regurgitation. In view of his cachexia I obtained a blood culture on March 2 to rule out endocarditis. Culture showed no growth at 5 days.  #7. History of left lower extremity DVT related to venous compression by lymphoma at time of initial diagnosis. Not currently on anticoagulation.  #8. 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  Time spent with direct face-to-face contact, exam, and discussion of biopsy results, review of treatment plan and side effects, and drink care plan for chemotherapy into the computer, patient counseling and coordination of care approximately 60 minutes.  I will transition his care to Dr. Alvy Bimler. Return in 2 weeks for nurse practitioner evaluation to assess tolerance to the Brentuximab and check a nadir count.   CC: Patient Care Team: Horatio Pel, MD as PCP - General (Internal Medicine)   Annia Belt, MD 3/11/20157:04 PM

## 2013-06-28 NOTE — Patient Instructions (Signed)
[facility] Discharge Instructions for Patients Receiving Chemotherapy  Today you received the following chemotherapy agents: Brentuximab  To help prevent nausea and vomiting after your treatment, we encourage you to take your nausea medication as prescribed.   If you develop nausea and vomiting that is not controlled by your nausea medication, call the clinic.   BELOW ARE SYMPTOMS THAT SHOULD BE REPORTED IMMEDIATELY:  *FEVER GREATER THAN 100.5 F  *CHILLS WITH OR WITHOUT FEVER  NAUSEA AND VOMITING THAT IS NOT CONTROLLED WITH YOUR NAUSEA MEDICATION  *UNUSUAL SHORTNESS OF BREATH  *UNUSUAL BRUISING OR BLEEDING  TENDERNESS IN MOUTH AND THROAT WITH OR WITHOUT PRESENCE OF ULCERS  *URINARY PROBLEMS  *BOWEL PROBLEMS  UNUSUAL RASH Items with * indicate a potential emergency and should be followed up as soon as possible.  Feel free to call the clinic you have any questions or concerns. The clinic phone number is [phone].     Brentuximab vedotin solution for injection What is this medicine? BRENTUXIMAB VEDOTIN (bren TUX see mab ve DOE tin) is a chemotherapy drug. It targets a specific protein within cancer cells and stops the cancer cells from growing. This medicine is used for treating Hodgkin's lymphoma and certain non-Hodgkin's lymphomas, such as systemic anaplastic large cell lymphoma. This medicine may be used for other purposes; ask your health care provider or pharmacist if you have questions. COMMON BRAND NAME(S): ADCETRIS What should I tell my health care provider before I take this medicine? They need to know if you have any of these conditions: -immune system problems -infection (especially a virus infection such as chickenpox, cold sores, or herpes) -low blood counts, like low white cell, platelet, or red cell counts -tingling of the fingers or toes, or other nerve disorder -an unusual or allergic reaction to brentuximab vedotin, other medicines,  foods, dyes, or preservatives -pregnant or trying to get pregnant -breast-feeding How should I use this medicine? This medicine is for infusion into a vein. It is given by a health care professional in a hospital or clinic setting. Talk to your pediatrician regarding the use of this medicine in children. Special care may be needed. Overdosage: If you think you've taken too much of this medicine contact a poison control center or emergency room at once. Overdosage: If you think you have taken too much of this medicine contact a poison control center or emergency room at once. NOTE: This medicine is only for you. Do not share this medicine with others. What if I miss a dose? It is important not to miss your dose. Call your doctor or health care professional if you are unable to keep an appointment. What may interact with this medicine? This medicine may interact with the following medications: -ketoconazole -rifampin -St. John's wort; Hypericum perforatum This list may not describe all possible interactions. Give your health care provider a list of all the medicines, herbs, non-prescription drugs, or dietary supplements you use. Also tell them if you smoke, drink alcohol, or use illegal drugs. Some items may interact with your medicine. What should I watch for while using this medicine? Visit your doctor for checks on your progress. This drug may make you feel generally unwell. This is not uncommon, as chemotherapy can affect healthy cells as well as cancer cells. Report any side effects. Continue your course of treatment even though you feel ill unless your doctor tells you to stop. Call your doctor or health care professional for advice if you get a fever, chills or  sore throat, or other symptoms of a cold or flu. Do not treat yourself. This drug decreases your body's ability to fight infections. Try to avoid being around people who are sick. This medicine may increase your risk to bruise or  bleed. Call your doctor or health care professional if you notice any unusual bleeding. Be careful brushing and flossing your teeth or using a toothpick because you may get an infection or bleed more easily. If you have any dental work done, tell your dentist you are receiving this medicine. Avoid taking products that contain aspirin, acetaminophen, ibuprofen, naproxen, or ketoprofen unless instructed by your doctor. These medicines may hide a fever. Do not become pregnant while taking this medicine. Women should inform their doctor if they wish to become pregnant or think they might be pregnant. There is a potential for serious side effects to an unborn child. Talk to your health care professional or pharmacist for more information. Do not breast-feed an infant while taking this medicine. What side effects may I notice from receiving this medicine? Side effects that you should report to your doctor or health care professional as soon as possible: -allergic reactions like skin rash, itching or hives, swelling of the face, lips, or tongue -low blood counts - this medicine may decrease the number of white blood cells, red blood cells and platelets. You may be at increased risk for infections and bleeding. -pain, tingling, numbness in the hands or feet -redness, blistering, peeling or loosening of the skin, including inside the mouth -signs of infection - fever or chills, cough, sore throat, pain or difficulty passing urine -signs of decreased platelets or bleeding - bruising, pinpoint red spots on the skin, black, tarry stools, blood in the urine -signs of decreased red blood cells - unusually weak or tired, fainting spells, lightheadedness Side effects that usually do not require medical attention (Report these to your doctor or health care professional if they continue or are bothersome.): -cough -diarrhea -dizziness -headache -muscle pain -nausea, vomiting -stomach pain -tiredness This list  may not describe all possible side effects. Call your doctor for medical advice about side effects. You may report side effects to FDA at 1-800-FDA-1088. Where should I keep my medicine? This drug is given in a hospital or clinic and will not be stored at home. NOTE: This sheet is a summary. It may not cover all possible information. If you have questions about this medicine, talk to your doctor, pharmacist, or health care provider.  2014, [employer]vier/Gold Standard. (2009-12-17 14:27:13)

## 2013-06-28 NOTE — Telephone Encounter (Signed)
gave pt appt for lab,md and M[facility] for  March and April , emailed michelle regarding chemo

## 2013-06-29 ENCOUNTER — Telehealth: Payer: Self-pay | Admitting: Oncology

## 2013-06-29 ENCOUNTER — Telehealth: Payer: Self-pay | Admitting: *Deleted

## 2013-06-29 NOTE — Telephone Encounter (Signed)
talked to pt and she is aware of appt for [facility] and April 2015 lab, md and chemo

## 2013-06-29 NOTE — Telephone Encounter (Signed)
Spoke with pt today for post chemo follow up call.  Pt stated he was doing better.  Stated still has coughs which Dr. Beryle Beams had prescribed Valtrex.  Denied nausea/vomiting; stated appetite better, and drinking lots of fluids as tolerated.  Bowel and bladder function fine.   Stated still has mild  pain in groin area which md aware.  Reinforced that pt needs to drink lots of fluids - gave pt varieties of po fluids suggestions.  Pt aware of next return appt on 07/12/13. Pt stated he had good experience with first chemo treatment.  Staff were courteous, and nice.  Pt was provided with explanations throughout chemo treatment.  No other concerns at this time.

## 2013-06-30 ENCOUNTER — Telehealth: Payer: Self-pay | Admitting: *Deleted

## 2013-06-30 NOTE — Telephone Encounter (Signed)
Patient's wife called.  "I'd like to get a message to Dr. Beryle Beams.  Please let him know Willie [name] is doing well.  He is resting at home.  No complaints at side effects at this time.  Someone called yesterday but we just wanted to let The doctor know."  Will notify providers.

## 2013-07-06 ENCOUNTER — Other Ambulatory Visit: Payer: Self-pay | Admitting: Oncology

## 2013-07-07 ENCOUNTER — Encounter: Payer: Self-pay | Admitting: *Deleted

## 2013-07-07 NOTE — Progress Notes (Signed)
[facility] Psychosocial Distress Screening Clinical Social Work  Clinical Social Work was referred by distress screening protocol.  The patient scored a 5 on the Psychosocial Distress Thermometer which indicates moderate distress. Clinical Social Worker phoned pt at home to assess for distress and other psychosocial needs. Pt reports to be coping well currently and denies any concerns at this time. Both him and his wife report to be doing well and are aware of how to seek assistance through the Pt and [facility] as needed.    Clinical Social Worker follow up needed: no  Loren Racer, [place] Social Worker [facility] for [facility] Wednesday, Thursday and Friday Phone: [phone] Fax: [phone]

## 2013-07-12 ENCOUNTER — Ambulatory Visit (HOSPITAL_BASED_OUTPATIENT_CLINIC_OR_DEPARTMENT_OTHER): Payer: Medicare Other | Admitting: Nurse Practitioner

## 2013-07-12 ENCOUNTER — Other Ambulatory Visit (HOSPITAL_BASED_OUTPATIENT_CLINIC_OR_DEPARTMENT_OTHER): Payer: Medicare Other

## 2013-07-12 ENCOUNTER — Telehealth: Payer: Self-pay | Admitting: Nurse Practitioner

## 2013-07-12 VITALS — BP 116/59 | HR 86 | Temp 97.7°F | Resp 18 | Ht 69.0 in | Wt 123.8 lb

## 2013-07-12 DIAGNOSIS — H1031 Unspecified acute conjunctivitis, right eye: Secondary | ICD-10-CM

## 2013-07-12 DIAGNOSIS — D649 Anemia, unspecified: Secondary | ICD-10-CM

## 2013-07-12 DIAGNOSIS — Z86718 Personal history of other venous thrombosis and embolism: Secondary | ICD-10-CM

## 2013-07-12 DIAGNOSIS — C8445 Peripheral T-cell lymphoma, not classified, lymph nodes of inguinal region and lower limb: Secondary | ICD-10-CM

## 2013-07-12 DIAGNOSIS — R634 Abnormal weight loss: Secondary | ICD-10-CM

## 2013-07-12 DIAGNOSIS — J42 Unspecified chronic bronchitis: Secondary | ICD-10-CM

## 2013-07-12 DIAGNOSIS — IMO0002 Reserved for concepts with insufficient information to code with codable children: Secondary | ICD-10-CM

## 2013-07-12 LAB — COMPREHENSIVE METABOLIC PANEL (CC13)
ALK PHOS: 110 U/L (ref 40–150)
ALT: 78 U/L — AB (ref 0–55)
ANION GAP: 10 meq/L (ref 3–11)
AST: 37 U/L — ABNORMAL HIGH (ref 5–34)
Albumin: 3.2 g/dL — ABNORMAL LOW (ref 3.5–5.0)
BILIRUBIN TOTAL: 0.52 mg/dL (ref 0.20–1.20)
BUN: 23.3 mg/dL (ref 7.0–26.0)
CO2: 22 mEq/L (ref 22–29)
Calcium: 9.1 mg/dL (ref 8.4–10.4)
Chloride: 99 mEq/L (ref 98–109)
Creatinine: 1.3 mg/dL (ref 0.7–1.3)
GLUCOSE: 140 mg/dL (ref 70–140)
Potassium: 4.5 mEq/L (ref 3.5–5.1)
SODIUM: 131 meq/L — AB (ref 136–145)
Total Protein: 6 g/dL — ABNORMAL LOW (ref 6.4–8.3)

## 2013-07-12 LAB — CBC WITH DIFFERENTIAL/PLATELET
BASO%: 1 % (ref 0.0–2.0)
BASOS ABS: 0.1 10*3/uL (ref 0.0–0.1)
EOS ABS: 0 10*3/uL (ref 0.0–0.5)
EOS%: 0.1 % (ref 0.0–7.0)
HCT: 32.5 % — ABNORMAL LOW (ref 38.4–49.9)
HEMOGLOBIN: 10.6 g/dL — AB (ref 13.0–17.1)
LYMPH#: 0.5 10*3/uL — AB (ref 0.9–3.3)
LYMPH%: 4.5 % — ABNORMAL LOW (ref 14.0–49.0)
MCH: 31 pg (ref 27.2–33.4)
MCHC: 32.6 g/dL (ref 32.0–36.0)
MCV: 95.2 fL (ref 79.3–98.0)
MONO#: 0.2 10*3/uL (ref 0.1–0.9)
MONO%: 1.4 % (ref 0.0–14.0)
NEUT%: 93 % — AB (ref 39.0–75.0)
NEUTROS ABS: 11.2 10*3/uL — AB (ref 1.5–6.5)
Platelets: 330 10*3/uL (ref 140–400)
RBC: 3.41 10*6/uL — ABNORMAL LOW (ref 4.20–5.82)
RDW: 13.8 % (ref 11.0–14.6)
WBC: 12 10*3/uL — ABNORMAL HIGH (ref 4.0–10.3)

## 2013-07-12 LAB — LACTATE DEHYDROGENASE (CC13): LDH: 254 U/L — ABNORMAL HIGH (ref 125–245)

## 2013-07-12 LAB — URIC ACID (CC13): URIC ACID, SERUM: 2.4 mg/dL — AB (ref 2.6–7.4)

## 2013-07-12 NOTE — Progress Notes (Signed)
[facility] OFFICE PROGRESS NOTE   Diagnosis:  ALK positive anaplastic large cell lymphoma.  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 He had an initial complete response to 6 cycles of CHOP chemotherapy. He relapsed October 2009 with extensive infiltration of tumor into the right iliopsoas muscle presenting as an erythematous nodular cutaneous mass in the right inguinal region. No other disease outside of the muscle noted on PET scan. He again achieved remission with ICE chemotherapy and then went onto receive BCNU, etoposide and Cytoxan conditioning followed by autologous stem cell transplant at [name] in January 2010.  He was recently found to have a recurrence in the soft tissues of the right inguinal region. He began brentuximab on 06/28/2013. He presents today for scheduled followup.  Will[name] feels he tolerated the first cycle of brentuximab well. He denies nausea/vomiting. No mouth sores. No diarrhea. He had some mild constipation. No numbness or tingling in his hands or feet. He had mild intermittent dizziness for a few days last week. The dizziness has completely resolved. Appetite is better. He is eating multiple small meals a day and drinking nutritional supplements. He and his wife have noted significant improvement in the erythema at the right low pelvis/ inguinal region. The cough he was experiencing at the time of his last visit has resolved. He denies shortness of breath to   Objective:  Vital signs in last 24 hours:  Blood pressure 116/59, pulse 86, temperature 97.7 F (36.5 C), temperature source Oral, resp. rate 18, height $RemoveBe'5\' 9"'pFdABvvTR$  (1.753 m), weight 123 lb 12.8 oz (56.155 kg), SpO2 96.00%.    HEENT: Oropharynx is without thrush or ulceration. Mucous membranes are  moist. Lymphatics: No palpable cervical, supraclavicular or axillary lymph nodes. Resp: Lungs are clear. No wheezes or rales. Cardio: Regular cardiac rhythm. Faint systolic murmur. GI: Abdomen is soft and nontender. No organomegaly. Vascular: No leg edema. Neuro: Alert and oriented. Motor strength 5 over 5. Knee DTRs 1+, symmetric.  Skin: Area of faint erythema at the right low pelvic/inguinal region measuring approximately 6 x 4 cm. Right inguinal region is firm.  Portacath/PICC-without erythema  Lab Results:  Lab Results  Component Value Date   WBC 12.0* 07/12/2013   HGB 10.6* 07/12/2013   HCT 32.5* 07/12/2013   MCV 95.2 07/12/2013   PLT 330 07/12/2013   NEUTROABS 11.2* 07/12/2013    Lab Results  Component Value Date   NA 131* 07/12/2013   SGOT 37 SGPT 78   Imaging:  No results found.  Medications: I have reviewed the patient's current medications.  Assessment/Plan:  1. Recent relapse of ALK positive T-cell lymphoma. Specifics of initial diagnosis and previous treatment outlined above. Status post cycle 1 of brentuximab 06/28/2013. 2. Mild elevation of transaminases 07/12/2013. 3. Weight loss likely secondary to #1. 4. Normochromic anemia. Hemoglobin is stable. 5. History of deep venous thrombosis related to vascular compression from initial lymphoma. 6. Coronary artery disease status post bypass surgery. Moderate to severe mitral regurgitation status post 2 vessel PTCI and stenting using drug-eluting stents and Angiomax in June of this year. 7. 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   Disposition:  [name] appears stable. He completed cycle one brentuximab on 06/28/2013. Clinically he appears to be responding with significant improvement in the  erythema/induration at the right low pelvis/inguinal region. He is scheduled to return for cycle 2 on 07/19/2013.  As noted above, transaminases were  mildly elevated on labs today. We will obtain a stat chemistry panel prior to proceeding with treatment on 07/19/2013.  He has a followup visit with Dr. Alvy Bimler on 07/31/2013. He knows to contact the office in the interim with any problems.  Plan reviewed with Dr. Alvy Bimler.  Ned Card ANP/GNP-BC   07/12/2013  4:11 PM

## 2013-07-12 NOTE — Telephone Encounter (Signed)
, °

## 2013-07-15 LAB — CMV (CYTOMEGALOVIRUS) DNA ULTRAQUANT, PCR

## 2013-07-17 ENCOUNTER — Telehealth: Payer: Self-pay | Admitting: *Deleted

## 2013-07-17 NOTE — Telephone Encounter (Signed)
Received vm call from pt's wife stating that [name] gave them lab report but didn't receive pages 2 & 3 & would like to get these.  She reports that they will be here 07/19/13 for treatment & can pick up then.  Returned call & spoke with pt & informed to ask the chemo nurse when they are here.

## 2013-07-19 ENCOUNTER — Other Ambulatory Visit: Payer: Self-pay | Admitting: Hematology and Oncology

## 2013-07-19 ENCOUNTER — Other Ambulatory Visit (HOSPITAL_BASED_OUTPATIENT_CLINIC_OR_DEPARTMENT_OTHER): Payer: Medicare Other

## 2013-07-19 ENCOUNTER — Ambulatory Visit (HOSPITAL_BASED_OUTPATIENT_CLINIC_OR_DEPARTMENT_OTHER): Payer: Medicare Other

## 2013-07-19 VITALS — BP 117/53 | HR 91 | Temp 97.6°F | Resp 18

## 2013-07-19 DIAGNOSIS — C8445 Peripheral T-cell lymphoma, not classified, lymph nodes of inguinal region and lower limb: Secondary | ICD-10-CM

## 2013-07-19 DIAGNOSIS — Z5112 Encounter for antineoplastic immunotherapy: Secondary | ICD-10-CM

## 2013-07-19 DIAGNOSIS — IMO0002 Reserved for concepts with insufficient information to code with codable children: Secondary | ICD-10-CM

## 2013-07-19 DIAGNOSIS — D649 Anemia, unspecified: Secondary | ICD-10-CM

## 2013-07-19 LAB — COMPREHENSIVE METABOLIC PANEL (CC13)
ALBUMIN: 3 g/dL — AB (ref 3.5–5.0)
ALT: 70 U/L — ABNORMAL HIGH (ref 0–55)
ANION GAP: 8 meq/L (ref 3–11)
AST: 38 U/L — ABNORMAL HIGH (ref 5–34)
Alkaline Phosphatase: 88 U/L (ref 40–150)
BUN: 22.3 mg/dL (ref 7.0–26.0)
CALCIUM: 8.9 mg/dL (ref 8.4–10.4)
CO2: 24 mEq/L (ref 22–29)
CREATININE: 1.1 mg/dL (ref 0.7–1.3)
Chloride: 100 mEq/L (ref 98–109)
GLUCOSE: 79 mg/dL (ref 70–140)
POTASSIUM: 3.6 meq/L (ref 3.5–5.1)
Sodium: 133 mEq/L — ABNORMAL LOW (ref 136–145)
Total Bilirubin: 0.48 mg/dL (ref 0.20–1.20)
Total Protein: 5.5 g/dL — ABNORMAL LOW (ref 6.4–8.3)

## 2013-07-19 LAB — CBC WITH DIFFERENTIAL/PLATELET
BASO%: 0.1 % (ref 0.0–2.0)
BASOS ABS: 0 10*3/uL (ref 0.0–0.1)
EOS ABS: 0.1 10*3/uL (ref 0.0–0.5)
EOS%: 0.9 % (ref 0.0–7.0)
HCT: 27.5 % — ABNORMAL LOW (ref 38.4–49.9)
HEMOGLOBIN: 9.3 g/dL — AB (ref 13.0–17.1)
LYMPH#: 2.1 10*3/uL (ref 0.9–3.3)
LYMPH%: 24.9 % (ref 14.0–49.0)
MCH: 31.8 pg (ref 27.2–33.4)
MCHC: 33.8 g/dL (ref 32.0–36.0)
MCV: 94.2 fL (ref 79.3–98.0)
MONO#: 0.9 10*3/uL (ref 0.1–0.9)
MONO%: 11.4 % (ref 0.0–14.0)
NEUT%: 62.7 % (ref 39.0–75.0)
NEUTROS ABS: 5.2 10*3/uL (ref 1.5–6.5)
Platelets: 179 10*3/uL (ref 140–400)
RBC: 2.92 10*6/uL — AB (ref 4.20–5.82)
RDW: 17.5 % — AB (ref 11.0–14.6)
WBC: 8.2 10*3/uL (ref 4.0–10.3)
nRBC: 0 % (ref 0–0)

## 2013-07-19 LAB — LACTATE DEHYDROGENASE (CC13): LDH: 209 U/L (ref 125–245)

## 2013-07-19 MED ORDER — ONDANSETRON 8 MG/50ML IVPB (CHCC)
8.0000 mg | Freq: Once | INTRAVENOUS | Status: AC
  Administered 2013-07-19: 8 mg via INTRAVENOUS

## 2013-07-19 MED ORDER — DEXAMETHASONE SODIUM PHOSPHATE 10 MG/ML IJ SOLN
INTRAMUSCULAR | Status: AC
  Filled 2013-07-19: qty 1

## 2013-07-19 MED ORDER — SODIUM CHLORIDE 0.9 % IV SOLN
Freq: Once | INTRAVENOUS | Status: AC
  Administered 2013-07-19: 14:00:00 via INTRAVENOUS

## 2013-07-19 MED ORDER — ACETAMINOPHEN 325 MG PO TABS
ORAL_TABLET | ORAL | Status: AC
  Filled 2013-07-19: qty 2

## 2013-07-19 MED ORDER — DEXAMETHASONE SODIUM PHOSPHATE 10 MG/ML IJ SOLN
10.0000 mg | Freq: Once | INTRAMUSCULAR | Status: AC
  Administered 2013-07-19: 10 mg via INTRAVENOUS

## 2013-07-19 MED ORDER — DIPHENHYDRAMINE HCL 50 MG/ML IJ SOLN
INTRAMUSCULAR | Status: AC
  Filled 2013-07-19: qty 1

## 2013-07-19 MED ORDER — ACETAMINOPHEN 325 MG PO TABS
650.0000 mg | ORAL_TABLET | Freq: Once | ORAL | Status: AC
Start: 2013-07-19 — End: 2013-07-19
  Administered 2013-07-19: 650 mg via ORAL

## 2013-07-19 MED ORDER — ONDANSETRON 8 MG/NS 50 ML IVPB
INTRAVENOUS | Status: AC
  Filled 2013-07-19: qty 8

## 2013-07-19 MED ORDER — SODIUM CHLORIDE 0.9 % IV SOLN
1.8000 mg/kg | Freq: Once | INTRAVENOUS | Status: AC
  Administered 2013-07-19: 100 mg via INTRAVENOUS
  Filled 2013-07-19: qty 20

## 2013-07-19 MED ORDER — DIPHENHYDRAMINE HCL 50 MG/ML IJ SOLN
50.0000 mg | Freq: Once | INTRAMUSCULAR | Status: AC
  Administered 2013-07-19: 50 mg via INTRAVENOUS

## 2013-07-19 NOTE — Patient Instructions (Signed)
[facility] Discharge Instructions for Patients Receiving Chemotherapy  Today you received the following chemotherapy agents brentuximab  To help prevent nausea and vomiting after your treatment, we encourage you to take your nausea medication as you did last time, per Dr Beryle Beams   If you develop nausea and vomiting that is not controlled by your nausea medication, call the clinic.   BELOW ARE SYMPTOMS THAT SHOULD BE REPORTED IMMEDIATELY:  *FEVER GREATER THAN 100.5 F  *CHILLS WITH OR WITHOUT FEVER  NAUSEA AND VOMITING THAT IS NOT CONTROLLED WITH YOUR NAUSEA MEDICATION  *UNUSUAL SHORTNESS OF BREATH  *UNUSUAL BRUISING OR BLEEDING  TENDERNESS IN MOUTH AND THROAT WITH OR WITHOUT PRESENCE OF ULCERS  *URINARY PROBLEMS  *BOWEL PROBLEMS  UNUSUAL RASH Items with * indicate a potential emergency and should be followed up as soon as possible.  Feel free to call the clinic you have any questions or concerns. The clinic phone number is [phone].

## 2013-07-31 ENCOUNTER — Other Ambulatory Visit (HOSPITAL_BASED_OUTPATIENT_CLINIC_OR_DEPARTMENT_OTHER): Payer: Medicare Other

## 2013-07-31 ENCOUNTER — Encounter: Payer: Self-pay | Admitting: Hematology and Oncology

## 2013-07-31 ENCOUNTER — Other Ambulatory Visit: Payer: Self-pay | Admitting: Hematology and Oncology

## 2013-07-31 ENCOUNTER — Telehealth: Payer: Self-pay | Admitting: Hematology and Oncology

## 2013-07-31 ENCOUNTER — Ambulatory Visit (HOSPITAL_BASED_OUTPATIENT_CLINIC_OR_DEPARTMENT_OTHER): Payer: Medicare Other | Admitting: Hematology and Oncology

## 2013-07-31 VITALS — BP 103/53 | HR 97 | Temp 97.4°F | Resp 18 | Ht 69.0 in | Wt 131.3 lb

## 2013-07-31 DIAGNOSIS — H1031 Unspecified acute conjunctivitis, right eye: Secondary | ICD-10-CM

## 2013-07-31 DIAGNOSIS — R634 Abnormal weight loss: Secondary | ICD-10-CM

## 2013-07-31 DIAGNOSIS — C8445 Peripheral T-cell lymphoma, not classified, lymph nodes of inguinal region and lower limb: Secondary | ICD-10-CM

## 2013-07-31 DIAGNOSIS — IMO0002 Reserved for concepts with insufficient information to code with codable children: Secondary | ICD-10-CM

## 2013-07-31 DIAGNOSIS — D649 Anemia, unspecified: Secondary | ICD-10-CM

## 2013-07-31 DIAGNOSIS — J42 Unspecified chronic bronchitis: Secondary | ICD-10-CM

## 2013-07-31 DIAGNOSIS — E291 Testicular hypofunction: Secondary | ICD-10-CM

## 2013-07-31 DIAGNOSIS — R7989 Other specified abnormal findings of blood chemistry: Secondary | ICD-10-CM

## 2013-07-31 LAB — LACTATE DEHYDROGENASE (CC13): LDH: 225 U/L (ref 125–245)

## 2013-07-31 LAB — CBC WITH DIFFERENTIAL/PLATELET
BASO%: 0.1 % (ref 0.0–2.0)
Basophils Absolute: 0 10*3/uL (ref 0.0–0.1)
EOS%: 0.4 % (ref 0.0–7.0)
Eosinophils Absolute: 0 10*3/uL (ref 0.0–0.5)
HCT: 31.4 % — ABNORMAL LOW (ref 38.4–49.9)
HGB: 10.3 g/dL — ABNORMAL LOW (ref 13.0–17.1)
LYMPH%: 13.2 % — AB (ref 14.0–49.0)
MCH: 32.6 pg (ref 27.2–33.4)
MCHC: 32.8 g/dL (ref 32.0–36.0)
MCV: 99.4 fL — ABNORMAL HIGH (ref 79.3–98.0)
MONO#: 0.9 10*3/uL (ref 0.1–0.9)
MONO%: 9.3 % (ref 0.0–14.0)
NEUT#: 7.1 10*3/uL — ABNORMAL HIGH (ref 1.5–6.5)
NEUT%: 77 % — ABNORMAL HIGH (ref 39.0–75.0)
PLATELETS: 149 10*3/uL (ref 140–400)
RBC: 3.16 10*6/uL — AB (ref 4.20–5.82)
RDW: 20.7 % — AB (ref 11.0–14.6)
WBC: 9.3 10*3/uL (ref 4.0–10.3)
lymph#: 1.2 10*3/uL (ref 0.9–3.3)

## 2013-07-31 LAB — COMPREHENSIVE METABOLIC PANEL (CC13)
ALBUMIN: 3.4 g/dL — AB (ref 3.5–5.0)
ALK PHOS: 83 U/L (ref 40–150)
ALT: 72 U/L — ABNORMAL HIGH (ref 0–55)
AST: 35 U/L — AB (ref 5–34)
Anion Gap: 7 mEq/L (ref 3–11)
BILIRUBIN TOTAL: 0.6 mg/dL (ref 0.20–1.20)
BUN: 22.1 mg/dL (ref 7.0–26.0)
CO2: 26 mEq/L (ref 22–29)
Calcium: 9.3 mg/dL (ref 8.4–10.4)
Chloride: 102 mEq/L (ref 98–109)
Creatinine: 1.2 mg/dL (ref 0.7–1.3)
Glucose: 75 mg/dl (ref 70–140)
POTASSIUM: 3.6 meq/L (ref 3.5–5.1)
Sodium: 134 mEq/L — ABNORMAL LOW (ref 136–145)
Total Protein: 5.8 g/dL — ABNORMAL LOW (ref 6.4–8.3)

## 2013-07-31 NOTE — Progress Notes (Signed)
[name] progress notes  Patient Care Team: Horatio Pel, MD as PCP - General (Internal Medicine) Ginette Pitman, MD  CHIEF COMPLAINTS/PURPOSE OF VISIT:  Recurrent ALK positive anaplastic large cell lymphoma involving the right inguinal region  HISTORY OF PRESENTING ILLNESS:  [name] 73 y.o. male was transferred to my care after his prior physician has left. 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 He had an initial complete response to 6 cycles of CHOP chemotherapy. He relapsed October 2009 with extensive infiltration of tumor into the right iliopsoas muscle presenting as an erythematous nodular cutaneous mass in the right inguinal region. No other disease outside of the muscle noted on PET scan. He again achieved remission with ICE chemotherapy and then went onto receive BCNU, etoposide and Cytoxan conditioning followed by autologous stem cell transplant at [name] in January 2010. He was recently found to have a recurrence in the soft tissues of the right inguinal region. He began brentuximab on 06/28/2013. He presents today for scheduled followup.  He denies any side effects from treatment. Denies any peripheral neuropathy. The right inguinal region is healing well. He denies any leg swelling. The patient continued to have low energy level.  MEDICAL HISTORY:  Past Medical History  Diagnosis Date  . Hypertension   . History of DVT of lower extremity     06/2001 as first sign of lymphoma: enlarged left inguinal nodes  . Low serum testosterone level 11/10/2011  . 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 Nocturia 11/10/2011  . Normochromic anemia 11/10/2011    Likely result of prior high dose  chemotherapy  . Unexplained weight loss 05/03/2012  . Mitral valvular regurgitation 06/01/12    moderate to severe by echo- asymptomatic  . Dyslipidemia   . RBBB   . Coronary artery disease   . Family history of anesthesia complication     "son, [name], larynx collapses" (10/11/2012)  . Heart murmur   . History of blood transfusion 2009-2010    "both related to his chemo" (10/11/2012)  . Chronic renal insufficiency, stage III (moderate)   . Non Hodgkin's lymphoma 2003; 2009    "chemo; stem cell transplant & high powered chemo" (10/10/2012)  . Renal insufficiency   . Deep inguinal pain, right 05/26/2013  . Conjunctivitis, acute, right eye 06/28/2013    SURGICAL HISTORY: Past Surgical History  Procedure Laterality Date  . Bone marrow transplant  2010  . Cardiac catheterization  10/03/2012; 2007  . Coronary angioplasty with stent placement  10/11/2012    "2" (10/11/2012)  . Tonsillectomy  1950's  . Cataract extraction w/ intraocular lens implant Left 1990's  . Coronary artery bypass graft  03/29/2006    "CABG X5" (10/11/2012)    SOCIAL HISTORY: History   Social History  . Marital Status: Married    Spouse Name: N/A    Number of Children: N/A  . Years of Education: N/A   Occupational History  . Not on file.   Social History Main Topics  . Smoking status: Former Smoker -- 0.50 packs/day for 13 years    Types: Cigarettes    Quit date: 07/01/1969  . Smokeless tobacco: Never Used  . Alcohol Use: No  . Drug Use: No  . Sexual Activity: Not Currently  Other Topics Concern  . Not on file   Social History Narrative  . No narrative on file    FAMILY HISTORY: Family History  Problem Relation Age of Onset  . Heart failure Father   . Leukemia Brother 21  . Cancer - Lung Mother     ALLERGIES:  is allergic to niaspan.  MEDICATIONS:  Current Outpatient Prescriptions  Medication Sig Dispense Refill  . Calcium Carbonate-Vitamin D (CALCIUM 500 + D PO) Take 1 tablet by mouth  daily.       . cholecalciferol (VITAMIN D) 1000 UNITS tablet Take 1,000 Units by mouth daily.       Marland Kitchen dexamethasone (DECADRON) 4 MG tablet Take 2 tablets twice daily for 2 days after chemotherapy treatment  50 tablet  4  . metoprolol tartrate (LOPRESSOR) 25 MG tablet Take 25 mg by mouth every morning. verified that it is not XL      . Multiple Vitamin (MULTIVITAMIN WITH MINERALS) TABS Take 1 tablet by mouth daily.      . nitroGLYCERIN (NITROSTAT) 0.4 MG SL tablet Place 1 tablet (0.4 mg total) under the tongue every 5 (five) minutes as needed for chest pain.  25 tablet  2  . ondansetron (ZOFRAN ODT) 8 MG disintegrating tablet Take 1 by mouth twice daily for 2 days to start day after chemotherapy treatment then 1 by mouth every 8 hours as needed for nausea or vomiting  20 tablet  6  . Potassium 99 MG TABS Take 1 tablet by mouth daily.      . rosuvastatin (CRESTOR) 10 MG tablet Take 10 mg by mouth 2 (two) times a week. On Wednesday and Sunday      . testosterone cypionate (DEPOTESTOTERONE CYPIONATE) 200 MG/ML injection Inject into the muscle every 14 (fourteen) days.      . valACYclovir (VALTREX) 1000 MG tablet Take 2 500 mg tablets twice daily for 7 days the one 500 mg tablet daily  40 tablet  3  . vitamin B-12 (CYANOCOBALAMIN) 500 MCG tablet Take 500 mcg by mouth daily.      Marland Kitchen aspirin EC 81 MG tablet Take 81 mg by mouth every evening.      . fish oil-omega-3 fatty acids 1000 MG capsule Take 1 g by mouth daily.       . prasugrel (EFFIENT) 10 MG TABS tablet Take 10 mg by mouth every evening.       No current facility-administered medications for this visit.    REVIEW OF SYSTEMS:   Constitutional: Denies fevers, chills or abnormal night sweats Eyes: Denies blurriness of vision, double vision or watery eyes Ears, nose, mouth, throat, and face: Denies mucositis or sore throat Respiratory: Denies cough, dyspnea or wheezes Cardiovascular: Denies palpitation, chest discomfort or lower extremity  swelling Gastrointestinal:  Denies nausea, heartburn or change in bowel habits Skin: Denies abnormal skin rashes Lymphatics: Denies new lymphadenopathy or easy bruising Neurological:Denies numbness, tingling or new weaknesses Behavioral/Psych: Mood is stable, no new changes  All other systems were reviewed with the patient and are negative.  PHYSICAL EXAMINATION: ECOG PERFORMANCE STATUS: 1 - Symptomatic but completely ambulatory  Filed Vitals:   07/31/13 0914  BP: 103/53  Pulse: 97  Temp: 97.4 F (36.3 C)  Resp: 18   Filed Weights   07/31/13 0914  Weight: 131 lb 4.8 oz (59.557 kg)    GENERAL:alert, no distress and comfortable SKIN: Mild erythema the skin lesion on the right groin  EYES: normal, conjunctiva are pink and non-injected,  sclera clear OROPHARYNX:no exudate, normal lips, buccal mucosa, and tongue  NECK: supple, thyroid normal size, non-tender, without nodularity LYMPH:  no palpable lymphadenopathy in the cervical, axillary or inguinal LUNGS: clear to auscultation and percussion with normal breathing effort HEART: regular rate & rhythm and no murmurs without lower extremity edema ABDOMEN:abdomen soft, non-tender and normal bowel sounds Musculoskeletal:no cyanosis of digits and no clubbing  PSYCH: alert & oriented x 3 with fluent speech NEURO: no focal motor/sensory deficits  LABORATORY DATA:  I have reviewed the data as listed Lab Results  Component Value Date   WBC 9.3 07/31/2013   HGB 10.3* 07/31/2013   HCT 31.4* 07/31/2013   MCV 99.4* 07/31/2013   PLT 149 07/31/2013    Recent Labs  10/10/12 1145 10/11/12 0338 10/12/12 0521  07/12/13 1315 07/19/13 1323 07/31/13 0846  NA 136 138 137  < > 131* 133* 134*  K 3.7 3.9 4.1  < > 4.5 3.6 3.6  CL 104 106 105  --   --   --   --   CO2 $Re'25 25 26  'DFl$ < > $R'22 24 26  'fE$ GLUCOSE 91 83 86  < > 140 79 75  BUN $Re'14 15 15  'mRJ$ < > 23.3 22.3 22.1  CREATININE 1.44* 1.44* 1.51*  < > 1.3 1.1 1.2  CALCIUM 8.6 8.3* 9.0  < > 9.1 8.9 9.3   GFRNONAA 47* 47* 45*  --   --   --   --   GFRAA 55* 55* 52*  --   --   --   --   PROT  --   --   --   < > 6.0* 5.5* 5.8*  ALBUMIN  --   --   --   < > 3.2* 3.0* 3.4*  AST  --   --   --   < > 37* 38* 35*  ALT  --   --   --   < > 78* 70* 72*  ALKPHOS  --   --   --   < > 110 88 83  BILITOT  --   --   --   < > 0.52 0.48 0.60  < > = values in this interval not displayed.  ASSESSMENT & PLAN:  #1 recurrent ALK positive for lymphoma He tolerated treatment well and had excellent response to treatment clinically. We will continue treatment every 3 weeks and I will order a PET scan to be done after 3 cycles of treatment #2 elevated liver function tests Overall he is stable. We will monitor this carefully. I will order PET scan to assess his treatment response and we will take a look at the liver area #3 anemia This is likely anemia of chronic disease. The patient denies recent history of bleeding such as epistaxis, hematuria or hematochezia. He is asymptomatic from the anemia. We will observe for now.  He does not require transfusion now.  #4 history of coronary artery disease I recommend he resume his antiplatelet agent #5 hypogonadism The patient is getting testosterone injection. I am very worried about his risk of DVT. I recommend the patient to consider carefully whether he wants to go on injection long-term.  Orders Placed This Encounter  Procedures  . NM PET Image Restag (PS) Skull Base To Thigh    Standing Status: Future     Number of Occurrences:      Standing Expiration Date: 09/30/2014    Order Specific Question:  Reason for Exam (SYMPTOM  OR  DIAGNOSIS REQUIRED)    Answer:  staging lymphoma    Order Specific Question:  Preferred imaging location?    Answer:  [facility]    All questions were answered. The patient knows to call the clinic with any problems, questions or concerns. I spent 25 minutes counseling the patient face to face. The total time spent in the  appointment was 30 minutes and more than 50% was on counseling.     Heath Lark, MD 07/31/2013 12:27 PM

## 2013-07-31 NOTE — Progress Notes (Signed)
Patient's wife came in. I have her Lenise's card. She is going to call billing and check about asst. I went ahead and gave her application--our balance is about 3500 now. She will check what gross income was for 2household and give Lenise a call to see if qualify for any grants. She inquired about asst with chemo. I advised Lenise will be able to research if any available based on his plan.

## 2013-07-31 NOTE — Telephone Encounter (Signed)
[facility] WIFE APPT SCHEDULE FOR APRIL/MAY. [facility] WILL CALL PT RE PET SCAN APPT.

## 2013-08-09 ENCOUNTER — Other Ambulatory Visit: Payer: Self-pay | Admitting: Hematology and Oncology

## 2013-08-09 ENCOUNTER — Ambulatory Visit (HOSPITAL_BASED_OUTPATIENT_CLINIC_OR_DEPARTMENT_OTHER): Payer: Medicare Other

## 2013-08-09 ENCOUNTER — Other Ambulatory Visit (HOSPITAL_BASED_OUTPATIENT_CLINIC_OR_DEPARTMENT_OTHER): Payer: Medicare Other

## 2013-08-09 VITALS — BP 133/62 | HR 104 | Temp 97.9°F | Resp 18

## 2013-08-09 DIAGNOSIS — C8445 Peripheral T-cell lymphoma, not classified, lymph nodes of inguinal region and lower limb: Secondary | ICD-10-CM

## 2013-08-09 DIAGNOSIS — IMO0002 Reserved for concepts with insufficient information to code with codable children: Secondary | ICD-10-CM

## 2013-08-09 DIAGNOSIS — Z5112 Encounter for antineoplastic immunotherapy: Secondary | ICD-10-CM

## 2013-08-09 LAB — CBC WITH DIFFERENTIAL/PLATELET
BASO%: 0.4 % (ref 0.0–2.0)
Basophils Absolute: 0 10*3/uL (ref 0.0–0.1)
EOS%: 1.8 % (ref 0.0–7.0)
Eosinophils Absolute: 0.1 10*3/uL (ref 0.0–0.5)
HCT: 33.1 % — ABNORMAL LOW (ref 38.4–49.9)
HGB: 11.2 g/dL — ABNORMAL LOW (ref 13.0–17.1)
LYMPH%: 12.7 % — AB (ref 14.0–49.0)
MCH: 33.9 pg — ABNORMAL HIGH (ref 27.2–33.4)
MCHC: 33.8 g/dL (ref 32.0–36.0)
MCV: 100.3 fL — ABNORMAL HIGH (ref 79.3–98.0)
MONO#: 0.5 10*3/uL (ref 0.1–0.9)
MONO%: 6.7 % (ref 0.0–14.0)
NEUT#: 5.5 10*3/uL (ref 1.5–6.5)
NEUT%: 78.4 % — ABNORMAL HIGH (ref 39.0–75.0)
PLATELETS: 153 10*3/uL (ref 140–400)
RBC: 3.3 10*6/uL — ABNORMAL LOW (ref 4.20–5.82)
RDW: 21.7 % — ABNORMAL HIGH (ref 11.0–14.6)
WBC: 7 10*3/uL (ref 4.0–10.3)
lymph#: 0.9 10*3/uL (ref 0.9–3.3)
nRBC: 0 % (ref 0–0)

## 2013-08-09 LAB — COMPREHENSIVE METABOLIC PANEL (CC13)
ALBUMIN: 3.4 g/dL — AB (ref 3.5–5.0)
ALK PHOS: 87 U/L (ref 40–150)
ALT: 28 U/L (ref 0–55)
AST: 25 U/L (ref 5–34)
Anion Gap: 10 mEq/L (ref 3–11)
BUN: 26.4 mg/dL — ABNORMAL HIGH (ref 7.0–26.0)
CO2: 22 mEq/L (ref 22–29)
Calcium: 9.9 mg/dL (ref 8.4–10.4)
Chloride: 103 mEq/L (ref 98–109)
Creatinine: 1.4 mg/dL — ABNORMAL HIGH (ref 0.7–1.3)
GLUCOSE: 122 mg/dL (ref 70–140)
POTASSIUM: 3.5 meq/L (ref 3.5–5.1)
Sodium: 136 mEq/L (ref 136–145)
Total Bilirubin: 0.65 mg/dL (ref 0.20–1.20)
Total Protein: 6.3 g/dL — ABNORMAL LOW (ref 6.4–8.3)

## 2013-08-09 LAB — LACTATE DEHYDROGENASE (CC13): LDH: 229 U/L (ref 125–245)

## 2013-08-09 MED ORDER — SODIUM CHLORIDE 0.9 % IV SOLN
1.8000 mg/kg | Freq: Once | INTRAVENOUS | Status: AC
  Administered 2013-08-09: 100 mg via INTRAVENOUS
  Filled 2013-08-09: qty 20

## 2013-08-09 MED ORDER — ONDANSETRON 8 MG/NS 50 ML IVPB
INTRAVENOUS | Status: AC
  Filled 2013-08-09: qty 8

## 2013-08-09 MED ORDER — DEXAMETHASONE SODIUM PHOSPHATE 10 MG/ML IJ SOLN
10.0000 mg | Freq: Once | INTRAMUSCULAR | Status: AC
  Administered 2013-08-09: 10 mg via INTRAVENOUS

## 2013-08-09 MED ORDER — ONDANSETRON 8 MG/50ML IVPB (CHCC)
8.0000 mg | Freq: Once | INTRAVENOUS | Status: AC
  Administered 2013-08-09: 8 mg via INTRAVENOUS

## 2013-08-09 MED ORDER — DIPHENHYDRAMINE HCL 50 MG/ML IJ SOLN
50.0000 mg | Freq: Once | INTRAMUSCULAR | Status: AC
  Administered 2013-08-09: 25 mg via INTRAVENOUS

## 2013-08-09 MED ORDER — SODIUM CHLORIDE 0.9 % IV SOLN
Freq: Once | INTRAVENOUS | Status: AC
  Administered 2013-08-09: 14:00:00 via INTRAVENOUS

## 2013-08-09 MED ORDER — DEXAMETHASONE SODIUM PHOSPHATE 10 MG/ML IJ SOLN
INTRAMUSCULAR | Status: AC
  Filled 2013-08-09: qty 1

## 2013-08-09 MED ORDER — ACETAMINOPHEN 325 MG PO TABS
650.0000 mg | ORAL_TABLET | Freq: Once | ORAL | Status: AC
  Administered 2013-08-09: 650 mg via ORAL

## 2013-08-09 MED ORDER — DIPHENHYDRAMINE HCL 25 MG PO CAPS
ORAL_CAPSULE | ORAL | Status: AC
  Filled 2013-08-09: qty 2

## 2013-08-09 MED ORDER — DIPHENHYDRAMINE HCL 50 MG/ML IJ SOLN
INTRAMUSCULAR | Status: AC
  Filled 2013-08-09: qty 1

## 2013-08-09 MED ORDER — ACETAMINOPHEN 325 MG PO TABS
ORAL_TABLET | ORAL | Status: AC
  Filled 2013-08-09: qty 2

## 2013-08-09 NOTE — Progress Notes (Signed)
Labs today within treatment parameters. LFTs WNL. Patient tolerated infusion well.

## 2013-08-09 NOTE — Patient Instructions (Signed)
[facility] Discharge Instructions for Patients Receiving Chemotherapy  Today you received the following chemotherapy agents: Brentuximab (Adecetris)  To help prevent nausea and vomiting after your treatment, we encourage you to take your nausea medication as prescribed by your physician.    If you develop nausea and vomiting that is not controlled by your nausea medication, call the clinic.   BELOW ARE SYMPTOMS THAT SHOULD BE REPORTED IMMEDIATELY:  *FEVER GREATER THAN 100.5 F  *CHILLS WITH OR WITHOUT FEVER  NAUSEA AND VOMITING THAT IS NOT CONTROLLED WITH YOUR NAUSEA MEDICATION  *UNUSUAL SHORTNESS OF BREATH  *UNUSUAL BRUISING OR BLEEDING  TENDERNESS IN MOUTH AND THROAT WITH OR WITHOUT PRESENCE OF ULCERS  *URINARY PROBLEMS  *BOWEL PROBLEMS  UNUSUAL RASH Items with * indicate a potential emergency and should be followed up as soon as possible.  Feel free to call the clinic you have any questions or concerns. The clinic phone number is [phone].

## 2013-08-17 ENCOUNTER — Telehealth: Payer: Self-pay | Admitting: *Deleted

## 2013-08-17 NOTE — Telephone Encounter (Signed)
Wife reports pt has had fevers the past two nights. Highest temp was 100.4 last night.  It is 100.3 this morning.  She has been giving him tylenol as directed several times a day for temp greater than 99.0.  He has seemed more tired than usual the past few days.  Denies any n/v/d.  States he is eating and drinking well.  Has non productive cough at night, but only occasionally during day.   Reported above to Dr. Alen Blew.  He instructed for wife/pt to continue to take tylenol as directed and to call if temp 101.5 or greater.  Gave these instructions to wife.  Also instructed to make sure pt is drinking plenty of fluids and to not exceed 4 gm tylenol per day.  Also call if pt develops a productive cough or urinary symptoms.  She verbalized understanding.

## 2013-08-18 ENCOUNTER — Telehealth: Payer: Self-pay | Admitting: *Deleted

## 2013-08-18 NOTE — Telephone Encounter (Signed)
Wife reports pt's fever "broke" yesterday around noon and he has not had a fever since.  She says pt is feeling much better today.  He is eating and drinking well.  Instructed wife to keep next appt as scheduled and call us if he develops fever again.  She verbalized understanding.

## 2013-08-18 NOTE — Telephone Encounter (Signed)
Can you call and see how the patient is doing? I have an opening at 1 pm to see him if he still has symptoms

## 2013-08-24 ENCOUNTER — Telehealth: Payer: Self-pay | Admitting: *Deleted

## 2013-08-24 NOTE — Telephone Encounter (Signed)
Wife left a message stating patient has had a low grade temperature. Was 100.6 last night, took tylenol. This am is 98.4. Just wanted to let Dr Alvy Bimler know.[name]

## 2013-08-25 ENCOUNTER — Telehealth: Payer: Self-pay | Admitting: *Deleted

## 2013-08-25 ENCOUNTER — Ambulatory Visit (HOSPITAL_COMMUNITY)
Admission: RE | Admit: 2013-08-25 | Discharge: 2013-08-25 | Disposition: A | Discharge status: Home / Self Care | Payer: Medicare Other | Source: Ambulatory Visit | Attending: Hematology and Oncology | Admitting: Hematology and Oncology

## 2013-08-25 DIAGNOSIS — C8589 Other specified types of non-Hodgkin lymphoma, extranodal and solid organ sites: Secondary | ICD-10-CM | POA: Insufficient documentation

## 2013-08-25 DIAGNOSIS — R911 Solitary pulmonary nodule: Secondary | ICD-10-CM | POA: Insufficient documentation

## 2013-08-25 DIAGNOSIS — C8445 Peripheral T-cell lymphoma, not classified, lymph nodes of inguinal region and lower limb: Secondary | ICD-10-CM

## 2013-08-25 LAB — GLUCOSE, CAPILLARY: Glucose-Capillary: 96 mg/dL (ref 70–99)

## 2013-08-25 MED ORDER — AMOXICILLIN-POT CLAVULANATE 875-125 MG PO TABS: 1.0000 | ORAL_TABLET | Freq: Two times a day (BID) | ORAL | Status: DC

## 2013-08-25 MED ORDER — FLUDEOXYGLUCOSE F - 18 (FDG) INJECTION
7.3000 | Freq: Once | INTRAVENOUS | Status: AC | PRN
  Administered 2013-08-25: 7.3 via INTRAVENOUS

## 2013-08-25 NOTE — Telephone Encounter (Signed)
Call report from Radiology,  Results to Dr. Alvy Bimler and she ordered Augmentin.  Instructed wife on possible infectious process in lungs shown on PET scan and new order for Augmentin sent to [facility].  She verbalized understanding and will pick up medication this afternoon.  Says pt still having some intermittent low grade temps and a headache. He continues to take tylenol as needed but is actually feeling pretty well today.

## 2013-08-25 NOTE — Telephone Encounter (Signed)
Call report from Dr. Ilda Foil regarding PET: some area in lung that could be drug reaction, infectious, or inflammatory. Does not appear like lymphoma. Will forward to MD.

## 2013-08-28 ENCOUNTER — Other Ambulatory Visit: Payer: Medicare Other

## 2013-08-28 ENCOUNTER — Ambulatory Visit: Payer: Medicare Other | Admitting: Hematology and Oncology

## 2013-08-28 ENCOUNTER — Telehealth: Payer: Self-pay | Admitting: *Deleted

## 2013-08-28 NOTE — Telephone Encounter (Signed)
Wife left VM to inform Dr. Alvy Bimler that pt is feeling a little better today since starting on antibiotic Friday. He has less coughing and no fevers.  He did have a few episodes of sob but overall feeling better. He sees Dr. Alvy Bimler this week on 5/13.

## 2013-08-29 ENCOUNTER — Telehealth: Payer: Self-pay | Admitting: *Deleted

## 2013-08-29 NOTE — Telephone Encounter (Signed)
[name] called and s/w operator this morning.  States she is very upset that nurse did not return her mother's phone call yesterday and that her father is having a hard time breathing.   Called pt's wife, [name], and she states pt is doing better today.  She states she did not expect a call back regarding her message yesterday.  Her message yesterday was to inform Dr. Alvy Bimler pt was feeling better.  Wife unaware that her daughter called [place] this morning.  Wife states pt will keep appt as scheduled w/ Dr. Alvy Bimler tomorrow and denies any concerns today.

## 2013-08-30 ENCOUNTER — Ambulatory Visit: Payer: Medicare Other

## 2013-08-30 ENCOUNTER — Other Ambulatory Visit (HOSPITAL_BASED_OUTPATIENT_CLINIC_OR_DEPARTMENT_OTHER): Payer: Medicare Other

## 2013-08-30 ENCOUNTER — Ambulatory Visit (HOSPITAL_BASED_OUTPATIENT_CLINIC_OR_DEPARTMENT_OTHER): Payer: Medicare Other | Admitting: Hematology and Oncology

## 2013-08-30 ENCOUNTER — Telehealth: Payer: Self-pay | Admitting: Hematology and Oncology

## 2013-08-30 VITALS — BP 131/64 | HR 108 | Resp 17 | Ht 69.0 in | Wt 133.4 lb

## 2013-08-30 DIAGNOSIS — E871 Hypo-osmolality and hyponatremia: Secondary | ICD-10-CM

## 2013-08-30 DIAGNOSIS — I82409 Acute embolism and thrombosis of unspecified deep veins of unspecified lower extremity: Secondary | ICD-10-CM

## 2013-08-30 DIAGNOSIS — I2510 Atherosclerotic heart disease of native coronary artery without angina pectoris: Secondary | ICD-10-CM

## 2013-08-30 DIAGNOSIS — D649 Anemia, unspecified: Secondary | ICD-10-CM

## 2013-08-30 DIAGNOSIS — IMO0002 Reserved for concepts with insufficient information to code with codable children: Secondary | ICD-10-CM

## 2013-08-30 DIAGNOSIS — C8445 Peripheral T-cell lymphoma, not classified, lymph nodes of inguinal region and lower limb: Secondary | ICD-10-CM

## 2013-08-30 DIAGNOSIS — R0602 Shortness of breath: Secondary | ICD-10-CM

## 2013-08-30 DIAGNOSIS — J189 Pneumonia, unspecified organism: Secondary | ICD-10-CM

## 2013-08-30 LAB — CBC WITH DIFFERENTIAL/PLATELET
BASO%: 0.2 % (ref 0.0–2.0)
Basophils Absolute: 0 10*3/uL (ref 0.0–0.1)
EOS ABS: 0.1 10*3/uL (ref 0.0–0.5)
EOS%: 0.3 % (ref 0.0–7.0)
HCT: 32 % — ABNORMAL LOW (ref 38.4–49.9)
HEMOGLOBIN: 11.1 g/dL — AB (ref 13.0–17.1)
LYMPH#: 1 10*3/uL (ref 0.9–3.3)
LYMPH%: 5.3 % — ABNORMAL LOW (ref 14.0–49.0)
MCH: 34.5 pg — ABNORMAL HIGH (ref 27.2–33.4)
MCHC: 34.7 g/dL (ref 32.0–36.0)
MCV: 99.4 fL — ABNORMAL HIGH (ref 79.3–98.0)
MONO#: 0.7 10*3/uL (ref 0.1–0.9)
MONO%: 3.5 % (ref 0.0–14.0)
NEUT#: 17.2 10*3/uL — ABNORMAL HIGH (ref 1.5–6.5)
NEUT%: 90.7 % — AB (ref 39.0–75.0)
PLATELETS: 313 10*3/uL (ref 140–400)
RBC: 3.22 10*6/uL — ABNORMAL LOW (ref 4.20–5.82)
RDW: 19 % — ABNORMAL HIGH (ref 11.0–14.6)
nRBC: 0 % (ref 0–0)

## 2013-08-30 LAB — COMPREHENSIVE METABOLIC PANEL (CC13)
ALBUMIN: 2.9 g/dL — AB (ref 3.5–5.0)
ALT: 32 U/L (ref 0–55)
ANION GAP: 13 meq/L — AB (ref 3–11)
AST: 34 U/L (ref 5–34)
Alkaline Phosphatase: 96 U/L (ref 40–150)
BUN: 27.2 mg/dL — ABNORMAL HIGH (ref 7.0–26.0)
CALCIUM: 11.7 mg/dL — AB (ref 8.4–10.4)
CO2: 26 meq/L (ref 22–29)
Chloride: 96 mEq/L — ABNORMAL LOW (ref 98–109)
Creatinine: 1.4 mg/dL — ABNORMAL HIGH (ref 0.7–1.3)
Glucose: 102 mg/dl (ref 70–140)
POTASSIUM: 3.4 meq/L — AB (ref 3.5–5.1)
Sodium: 135 mEq/L — ABNORMAL LOW (ref 136–145)
Total Bilirubin: 0.45 mg/dL (ref 0.20–1.20)
Total Protein: 6.2 g/dL — ABNORMAL LOW (ref 6.4–8.3)

## 2013-08-30 LAB — LACTATE DEHYDROGENASE (CC13): LDH: 455 U/L — ABNORMAL HIGH (ref 125–245)

## 2013-08-30 MED ORDER — PREDNISONE 20 MG PO TABS: 60.0000 mg | ORAL_TABLET | Freq: Every day | ORAL | Status: AC

## 2013-08-30 NOTE — Telephone Encounter (Signed)
gv pt wife appt schedule for may. no availability for lb/fu/tx same say on 5/28. per NG ok to push tx to 5/29 and she will see pt 5/29 @ 10:45am.

## 2013-08-30 NOTE — Progress Notes (Signed)
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 Willie [name] is a 73 year old man diagnosed with ALK positive anaplastic large cell lymphoma in March 2003 presenting with left inguinal lymphadenopathy with lymphedema and left lower extremity deep vein thrombosis. He had an initial complete response to 6 cycles of CHOP chemotherapy. He relapsed October 2009 with extensive infiltration of tumor into the right iliopsoas muscle presenting as an erythematous nodular cutaneous mass in the right inguinal region. No other disease outside of the muscle noted on PET scan. He again achieved remission with ICE chemotherapy and then went onto receive BCNU, etoposide and Cytoxan conditioning followed by autologous stem cell transplant at [facility] in January 2010. He was recently found to have a recurrence in the soft tissues of the right inguinal region. He began brentuximab on 06/28/2013. Repeat PET CT scan on 08/25/2013 showed abnormalities in his lung consistent with infection.  INTERVAL HISTORY: Willie [name] 73 y.o. male returns for further followup. Recently, we contacted his wife to start him on antibiotic treatment due to fever. His fever resolved. However the patient felt he has persistent shortness of breath. He also complained of loss of appetite recently. He denies any at swelling in his groin. Denies any chest pain or dizziness.  I have reviewed the past medical history, past surgical history, social history and family history with the patient and they are unchanged from previous note.  ALLERGIES:  is allergic to niaspan.  MEDICATIONS:  Current Outpatient Prescriptions  Medication Sig Dispense Refill  . amoxicillin-clavulanate (AUGMENTIN) 875-125 MG per  tablet Take 1 tablet by mouth 2 (two) times daily.  14 tablet  0  . aspirin EC 81 MG tablet Take 81 mg by mouth every evening.      . Calcium Carbonate-Vitamin D (CALCIUM 500 + D PO) Take 1 tablet by mouth daily.       . cholecalciferol (VITAMIN D) 1000 UNITS tablet Take 1,000 Units by mouth daily.       Marland Kitchen dexamethasone (DECADRON) 4 MG tablet Take 2 tablets twice daily for 2 days after chemotherapy treatment  50 tablet  4  . fish oil-omega-3 fatty acids 1000 MG capsule Take 1 g by mouth daily.       . metoprolol tartrate (LOPRESSOR) 25 MG tablet Take 25 mg by mouth every morning. verified that it is not XL      . Multiple Vitamin (MULTIVITAMIN WITH MINERALS) TABS Take 1 tablet by mouth daily.      . nitroGLYCERIN (NITROSTAT) 0.4 MG SL tablet Place 1 tablet (0.4 mg total) under the tongue every 5 (five) minutes as needed for chest pain.  25 tablet  2  . ondansetron (ZOFRAN ODT) 8 MG disintegrating tablet Take 1 by mouth twice daily for 2 days to start day after chemotherapy treatment then 1 by mouth every 8 hours as needed for nausea or vomiting  20 tablet  6  . Potassium 99 MG TABS Take 1 tablet by mouth daily.      . prasugrel (EFFIENT) 10 MG TABS tablet Take 10 mg by mouth every evening.      . rosuvastatin (CRESTOR) 10 MG tablet Take 10 mg by mouth 2 (two) times a week. On Wednesday and Sunday      .  testosterone cypionate (DEPOTESTOTERONE CYPIONATE) 200 MG/ML injection Inject into the muscle every 14 (fourteen) days.      . valACYclovir (VALTREX) 1000 MG tablet Take 2 500 mg tablets twice daily for 7 days the one 500 mg tablet daily  40 tablet  3  . vitamin B-12 (CYANOCOBALAMIN) 500 MCG tablet Take 500 mcg by mouth daily.      . predniSONE (DELTASONE) 20 MG tablet Take 3 tablets (60 mg total) by mouth daily with breakfast.  30 tablet  0   No current facility-administered medications for this visit.    REVIEW OF SYSTEMS:   Constitutional: Denies fevers, chills or abnormal weight loss Eyes:  Denies blurriness of vision Ears, nose, mouth, throat, and face: Denies mucositis or sore throat Cardiovascular: Denies palpitation, chest discomfort or lower extremity swelling Gastrointestinal:  Denies nausea, heartburn or change in bowel habits Skin: Denies abnormal skin rashes Lymphatics: Denies new lymphadenopathy or easy bruising Neurological:Denies numbness, tingling or new weaknesses Behavioral/Psych: Mood is stable, no new changes  All other systems were reviewed with the patient and are negative.  PHYSICAL EXAMINATION: ECOG PERFORMANCE STATUS: 2 - Symptomatic, <50% confined to bed  Filed Vitals:   08/30/13 1256  BP: 131/64  Pulse: 108  Resp: 17   Filed Weights   08/30/13 1256  Weight: 133 lb 6.4 oz (60.51 kg)    GENERAL:alert, no distress and comfortable. He looks thin and cachectic  SKIN: skin color, texture, turgor are normal, no rashes or significant lesions EYES: normal, Conjunctiva are pink and non-injected, sclera clear OROPHARYNX:no exudate, no erythema and lips, buccal mucosa, and tongue normal  NECK: supple, thyroid normal size, non-tender, without nodularity LYMPH:  no palpable lymphadenopathy in the cervical, axillary or inguinal LUNGS:Reduced breath sounds with bilateral crackles at the lung bases.  HEART:  tachycardia with soft systolic murmur on the left sternal border. Very mild bilateral ankle edema.  ABDOMEN:abdomen soft, non-tender and normal bowel sounds Musculoskeletal:no cyanosis of digits and no clubbing  NEURO: alert & oriented x 3 with fluent speech, no focal motor/sensory deficits  LABORATORY DATA:  I have reviewed the data as listed    Component Value Date/Time   NA 135* 08/30/2013 1240   NA 137 10/12/2012 0521   NA 143 04/29/2011 1201   K 3.4* 08/30/2013 1240   K 3.8 06/15/2013 1030   K 4.2 04/29/2011 1201   CL 105 10/12/2012 0521   CL 106 05/03/2012 1149   CL 102 04/29/2011 1201   CO2 26 08/30/2013 1240   CO2 26 10/12/2012 0521   CO2 29  04/29/2011 1201   GLUCOSE 102 08/30/2013 1240   GLUCOSE 86 10/12/2012 0521   GLUCOSE 105* 05/03/2012 1149   GLUCOSE 92 04/29/2011 1201   BUN 27.2* 08/30/2013 1240   BUN 15 10/12/2012 0521   BUN 15 04/29/2011 1201   CREATININE 1.4* 08/30/2013 1240   CREATININE 1.51* 10/12/2012 0521   CREATININE 1.3* 04/29/2011 1201   CALCIUM 11.7* 08/30/2013 1240   CALCIUM 9.0 10/12/2012 0521   CALCIUM 9.2 04/29/2011 1201   PROT 6.2* 08/30/2013 1240   PROT 5.5* 10/03/2012 0524   PROT 6.6 04/29/2011 1201   ALBUMIN 2.9* 08/30/2013 1240   ALBUMIN 3.1* 10/03/2012 0524   AST 34 08/30/2013 1240   AST 21 10/03/2012 0524   AST 30 04/29/2011 1201   ALT 32 08/30/2013 1240   ALT 21 10/03/2012 0524   ALT 27 04/29/2011 1201   ALKPHOS 96 08/30/2013 1240   ALKPHOS 82 10/03/2012 0524  ALKPHOS 111* 04/29/2011 1201   BILITOT 0.45 08/30/2013 1240   BILITOT 0.3 10/03/2012 0524   BILITOT 0.60 04/29/2011 1201   GFRNONAA 45* 10/12/2012 0521   GFRAA 52* 10/12/2012 0521    No results found for this basename: SPEP,  UPEP,   kappa and lambda light chains    Lab Results  Component Value Date   WBC 19.0 Repeated and Verified* 08/30/2013   NEUTROABS 17.2* 08/30/2013   HGB 11.1* 08/30/2013   HCT 32.0* 08/30/2013   MCV 99.4* 08/30/2013   PLT 313 08/30/2013      Chemistry      Component Value Date/Time   NA 135* 08/30/2013 1240   NA 137 10/12/2012 0521   NA 143 04/29/2011 1201   K 3.4* 08/30/2013 1240   K 3.8 06/15/2013 1030   K 4.2 04/29/2011 1201   CL 105 10/12/2012 0521   CL 106 05/03/2012 1149   CL 102 04/29/2011 1201   CO2 26 08/30/2013 1240   CO2 26 10/12/2012 0521   CO2 29 04/29/2011 1201   BUN 27.2* 08/30/2013 1240   BUN 15 10/12/2012 0521   BUN 15 04/29/2011 1201   CREATININE 1.4* 08/30/2013 1240   CREATININE 1.51* 10/12/2012 0521   CREATININE 1.3* 04/29/2011 1201      Component Value Date/Time   CALCIUM 11.7* 08/30/2013 1240   CALCIUM 9.0 10/12/2012 0521   CALCIUM 9.2 04/29/2011 1201   ALKPHOS 96 08/30/2013 1240   ALKPHOS 82 10/03/2012 0524   ALKPHOS 111*  04/29/2011 1201   AST 34 08/30/2013 1240   AST 21 10/03/2012 0524   AST 30 04/29/2011 1201   ALT 32 08/30/2013 1240   ALT 21 10/03/2012 0524   ALT 27 04/29/2011 1201   BILITOT 0.45 08/30/2013 1240   BILITOT 0.3 10/03/2012 0524   BILITOT 0.60 04/29/2011 1201     RADIOGRAPHIC STUDIES: I reviewed the PET/CT scan with him and his wife  I have personally reviewed the radiological images as listed and agreed with the findings in the report.  ASSESSMENT & PLAN:  #1 recurrent ALK positive for lymphoma He tolerated treatment well and had excellent response to treatment clinically and radiographically. Unfortunately, he contracted pneumonia. I will put his treatment on hold and restart in 2 weeks #2 pneumonia Imaging study is highly suspicious for pneumonia. He has evidence of leukocytosis. His fever resolved however he complained of worsening shortness of breath which is suspect is due to COPD exacerbation. The patient used to smoke in the past. We will proceed to give him 60 mg of prednisone daily for 1 week. I informed his wife to contact me in 2 days time to report on his symptoms. If he continued to have worsening shortness of breath and cough, we might have to admit him to the hospital for management.  #3 anemia This is likely anemia of chronic disease. The patient denies recent history of bleeding such as epistaxis, hematuria or hematochezia. He is asymptomatic from the anemia. We will observe for now.  He does not require transfusion now.  #4 history of coronary artery disease I recommend he resume his antiplatelet agent #5 hyponatremia with elevated serum creatinine Recommend increase oral fluid intake #6 mild hypercalcemia Cause is unknown. PET CT scan show resolution of his disease. We'll monitored very carefully. I do not think the abnormal changes in his lungs are compatible with lymphoma.  All questions were answered. The patient knows to call the clinic with any problems, questions or  concerns.  No barriers to learning was detected.    Heath Lark, MD 08/30/2013 2:24 PM

## 2013-09-01 ENCOUNTER — Other Ambulatory Visit: Payer: Self-pay | Admitting: Hematology and Oncology

## 2013-09-01 ENCOUNTER — Telehealth: Payer: Self-pay | Admitting: *Deleted

## 2013-09-01 ENCOUNTER — Telehealth: Payer: Self-pay | Admitting: Hematology and Oncology

## 2013-09-01 DIAGNOSIS — J42 Unspecified chronic bronchitis: Secondary | ICD-10-CM

## 2013-09-01 MED ORDER — AMOXICILLIN-POT CLAVULANATE 875-125 MG PO TABS: 1.0000 | ORAL_TABLET | Freq: Two times a day (BID) | ORAL | Status: DC

## 2013-09-01 NOTE — Telephone Encounter (Signed)
2 options: 1) Admit to hospital 2) Extend antibiotics for a few more days till next week and reassess, continue prednisone & pulmonary consult referral

## 2013-09-01 NOTE — Telephone Encounter (Signed)
S/w christa from [facility] pul[facility]ology dept and she is aware of the referral in the workque and she will contact the pt with an appt.

## 2013-09-01 NOTE — Telephone Encounter (Signed)
Informed wife for pt to continue Augmentin BID for 7 more days.  Rx sent to [facility].  Also continue Prednisone 60 mg daily for total of 10 days.  He does have enough prednisone to complete 10 days. Pulmonary referral made.  She verbalized understanding and states will call us back on Monday morning to let us know how he is doing.

## 2013-09-01 NOTE — Telephone Encounter (Signed)
Wife thinks pt ok to stay home at this point if the antibiotics can be extended.  She asks if he will continue on same antibiotic or should it be changed to something "stronger like levaquin?  Should he continue on prednisone 60 mg daily?  He has enough to last through Monday.  Reports his ankle swelling has improved and denies any fevers.  She agrees w/ Pulmonary Consult.

## 2013-09-01 NOTE — Telephone Encounter (Signed)
Please call in 1 week of Augmentin 875 mg BID PO. Would not recommend changing ABx yet, he did improve on ut I will place POF for pulm Continue prednisone for 10 days total

## 2013-09-01 NOTE — Telephone Encounter (Signed)
Wife reports pt has "very slight improvement" since starting on Prednisone 2 days ago.  He took last antibiotic this morning.  He is still SOB and has "no energy."  She asks if anything else can be done at this point as he has not improved much.

## 2013-09-04 ENCOUNTER — Emergency Department (HOSPITAL_COMMUNITY): Payer: Medicare Other

## 2013-09-04 ENCOUNTER — Inpatient Hospital Stay (HOSPITAL_COMMUNITY)
Admission: EM | Admit: 2013-09-04 | Discharge: 2013-09-12 | DRG: 196 | Disposition: A | Discharge status: Rehabilitation Facility | Payer: Medicare Other | Attending: Internal Medicine | Admitting: Internal Medicine

## 2013-09-04 ENCOUNTER — Telehealth: Payer: Self-pay | Admitting: *Deleted

## 2013-09-04 ENCOUNTER — Encounter (HOSPITAL_COMMUNITY): Payer: Self-pay | Admitting: Emergency Medicine

## 2013-09-04 DIAGNOSIS — I059 Rheumatic mitral valve disease, unspecified: Secondary | ICD-10-CM | POA: Diagnosis present

## 2013-09-04 DIAGNOSIS — Z79899 Other long term (current) drug therapy: Secondary | ICD-10-CM

## 2013-09-04 DIAGNOSIS — Z86718 Personal history of other venous thrombosis and embolism: Secondary | ICD-10-CM

## 2013-09-04 DIAGNOSIS — Z9484 Stem cells transplant status: Secondary | ICD-10-CM

## 2013-09-04 DIAGNOSIS — Z9861 Coronary angioplasty status: Secondary | ICD-10-CM

## 2013-09-04 DIAGNOSIS — I10 Essential (primary) hypertension: Secondary | ICD-10-CM

## 2013-09-04 DIAGNOSIS — J984 Other disorders of lung: Secondary | ICD-10-CM | POA: Diagnosis present

## 2013-09-04 DIAGNOSIS — I340 Nonrheumatic mitral (valve) insufficiency: Secondary | ICD-10-CM

## 2013-09-04 DIAGNOSIS — Z7982 Long term (current) use of aspirin: Secondary | ICD-10-CM

## 2013-09-04 DIAGNOSIS — T887XXA Unspecified adverse effect of drug or medicament, initial encounter: Secondary | ICD-10-CM | POA: Diagnosis present

## 2013-09-04 DIAGNOSIS — Z9481 Bone marrow transplant status: Secondary | ICD-10-CM

## 2013-09-04 DIAGNOSIS — I2510 Atherosclerotic heart disease of native coronary artery without angina pectoris: Secondary | ICD-10-CM

## 2013-09-04 DIAGNOSIS — J189 Pneumonia, unspecified organism: Secondary | ICD-10-CM

## 2013-09-04 DIAGNOSIS — Z951 Presence of aortocoronary bypass graft: Secondary | ICD-10-CM

## 2013-09-04 DIAGNOSIS — J939 Pneumothorax, unspecified: Secondary | ICD-10-CM

## 2013-09-04 DIAGNOSIS — J679 Hypersensitivity pneumonitis due to unspecified organic dust: Principal | ICD-10-CM | POA: Diagnosis present

## 2013-09-04 DIAGNOSIS — J982 Interstitial emphysema: Secondary | ICD-10-CM

## 2013-09-04 DIAGNOSIS — J9600 Acute respiratory failure, unspecified whether with hypoxia or hypercapnia: Secondary | ICD-10-CM

## 2013-09-04 DIAGNOSIS — I129 Hypertensive chronic kidney disease with stage 1 through stage 4 chronic kidney disease, or unspecified chronic kidney disease: Secondary | ICD-10-CM | POA: Diagnosis present

## 2013-09-04 DIAGNOSIS — E785 Hyperlipidemia, unspecified: Secondary | ICD-10-CM | POA: Diagnosis present

## 2013-09-04 DIAGNOSIS — N183 Chronic kidney disease, stage 3 unspecified: Secondary | ICD-10-CM

## 2013-09-04 DIAGNOSIS — R5381 Other malaise: Secondary | ICD-10-CM

## 2013-09-04 DIAGNOSIS — C8590 Non-Hodgkin lymphoma, unspecified, unspecified site: Secondary | ICD-10-CM

## 2013-09-04 DIAGNOSIS — Z87891 Personal history of nicotine dependence: Secondary | ICD-10-CM

## 2013-09-04 DIAGNOSIS — Z806 Family history of leukemia: Secondary | ICD-10-CM

## 2013-09-04 DIAGNOSIS — E871 Hypo-osmolality and hyponatremia: Secondary | ICD-10-CM | POA: Diagnosis present

## 2013-09-04 DIAGNOSIS — E236 Other disorders of pituitary gland: Secondary | ICD-10-CM | POA: Diagnosis present

## 2013-09-04 DIAGNOSIS — C8589 Other specified types of non-Hodgkin lymphoma, extranodal and solid organ sites: Secondary | ICD-10-CM | POA: Diagnosis present

## 2013-09-04 DIAGNOSIS — C8445 Peripheral T-cell lymphoma, not classified, lymph nodes of inguinal region and lower limb: Secondary | ICD-10-CM

## 2013-09-04 LAB — CBC WITH DIFFERENTIAL/PLATELET
BASOS ABS: 0 10*3/uL (ref 0.0–0.1)
BASOS PCT: 0 % (ref 0–1)
Eosinophils Absolute: 0 10*3/uL (ref 0.0–0.7)
Eosinophils Relative: 0 % (ref 0–5)
HEMATOCRIT: 31.3 % — AB (ref 39.0–52.0)
Hemoglobin: 11.1 g/dL — ABNORMAL LOW (ref 13.0–17.0)
Lymphocytes Relative: 3 % — ABNORMAL LOW (ref 12–46)
Lymphs Abs: 0.4 10*3/uL — ABNORMAL LOW (ref 0.7–4.0)
MCH: 34.5 pg — ABNORMAL HIGH (ref 26.0–34.0)
MCHC: 35.5 g/dL (ref 30.0–36.0)
MCV: 97.2 fL (ref 78.0–100.0)
MONO ABS: 0.2 10*3/uL (ref 0.1–1.0)
Monocytes Relative: 2 % — ABNORMAL LOW (ref 3–12)
NEUTROS PCT: 95 % — AB (ref 43–77)
Neutro Abs: 13.8 10*3/uL — ABNORMAL HIGH (ref 1.7–7.7)
Platelets: 259 10*3/uL (ref 150–400)
RBC: 3.22 MIL/uL — ABNORMAL LOW (ref 4.22–5.81)
RDW: 18.5 % — ABNORMAL HIGH (ref 11.5–15.5)
WBC: 14.5 10*3/uL — ABNORMAL HIGH (ref 4.0–10.5)

## 2013-09-04 LAB — I-STAT CG4 LACTIC ACID, ED: Lactic Acid, Venous: 0.78 mmol/L (ref 0.5–2.2)

## 2013-09-04 LAB — COMPREHENSIVE METABOLIC PANEL
ALBUMIN: 2.6 g/dL — AB (ref 3.5–5.2)
ALT: 26 U/L (ref 0–53)
AST: 41 U/L — AB (ref 0–37)
Alkaline Phosphatase: 88 U/L (ref 39–117)
BILIRUBIN TOTAL: 0.5 mg/dL (ref 0.3–1.2)
BUN: 30 mg/dL — ABNORMAL HIGH (ref 6–23)
CO2: 27 mEq/L (ref 19–32)
Calcium: 9.3 mg/dL (ref 8.4–10.5)
Chloride: 85 mEq/L — ABNORMAL LOW (ref 96–112)
Creatinine, Ser: 1.21 mg/dL (ref 0.50–1.35)
GFR calc Af Amer: 68 mL/min — ABNORMAL LOW (ref >90)
GFR calc non Af Amer: 58 mL/min — ABNORMAL LOW (ref >90)
Glucose, Bld: 119 mg/dL — ABNORMAL HIGH (ref 70–99)
Potassium: 3.8 mEq/L (ref 3.7–5.3)
Sodium: 124 mEq/L — ABNORMAL LOW (ref 137–147)
Total Protein: 5.5 g/dL — ABNORMAL LOW (ref 6.0–8.3)

## 2013-09-04 LAB — I-STAT TROPONIN, ED: Troponin i, poc: 0.06 ng/mL (ref 0.00–0.08)

## 2013-09-04 LAB — PRO B NATRIURETIC PEPTIDE: Pro B Natriuretic peptide (BNP): 3670 pg/mL — ABNORMAL HIGH (ref 0–125)

## 2013-09-04 MED ORDER — IOHEXOL 350 MG/ML SOLN
100.0000 mL | Freq: Once | INTRAVENOUS | Status: AC | PRN
  Administered 2013-09-04: 100 mL via INTRAVENOUS

## 2013-09-04 MED ORDER — VANCOMYCIN HCL IN DEXTROSE 1-5 GM/200ML-% IV SOLN
1000.0000 mg | Freq: Once | INTRAVENOUS | Status: AC
  Administered 2013-09-04: 1000 mg via INTRAVENOUS
  Filled 2013-09-04: qty 200

## 2013-09-04 MED ORDER — VITAMIN D3 25 MCG (1000 UNIT) PO TABS
1000.0000 [IU] | ORAL_TABLET | Freq: Every day | ORAL | Status: DC
  Administered 2013-09-05 – 2013-09-12 (×8): 1000 [IU] via ORAL
  Filled 2013-09-04 (×8): qty 1

## 2013-09-04 MED ORDER — SODIUM CHLORIDE 0.9 % IJ SOLN
3.0000 mL | Freq: Two times a day (BID) | INTRAMUSCULAR | Status: DC
  Administered 2013-09-06 – 2013-09-11 (×6): 3 mL via INTRAVENOUS

## 2013-09-04 MED ORDER — PRASUGREL HCL 10 MG PO TABS
10.0000 mg | ORAL_TABLET | Freq: Every evening | ORAL | Status: DC
  Administered 2013-09-04 – 2013-09-12 (×9): 10 mg via ORAL
  Filled 2013-09-04 (×9): qty 1

## 2013-09-04 MED ORDER — ALUM & MAG HYDROXIDE-SIMETH 200-200-20 MG/5ML PO SUSP: 30.0000 mL | Freq: Four times a day (QID) | ORAL | Status: DC | PRN

## 2013-09-04 MED ORDER — FUROSEMIDE 10 MG/ML IJ SOLN
40.0000 mg | Freq: Once | INTRAMUSCULAR | Status: AC
  Administered 2013-09-04: 40 mg via INTRAVENOUS
  Filled 2013-09-04: qty 4

## 2013-09-04 MED ORDER — SENNOSIDES-DOCUSATE SODIUM 8.6-50 MG PO TABS: 1.0000 | ORAL_TABLET | Freq: Every evening | ORAL | Status: DC | PRN

## 2013-09-04 MED ORDER — ACETAMINOPHEN 325 MG PO TABS
650.0000 mg | ORAL_TABLET | Freq: Four times a day (QID) | ORAL | Status: DC | PRN
Start: 2013-09-04 — End: 2013-09-12
  Administered 2013-09-11: 650 mg via ORAL
  Filled 2013-09-04: qty 2

## 2013-09-04 MED ORDER — SULFAMETHOXAZOLE-TRIMETHOPRIM 400-80 MG/5ML IV SOLN
15.0000 mg/kg/d | Freq: Three times a day (TID) | INTRAVENOUS | Status: DC
  Administered 2013-09-04 – 2013-09-07 (×8): 302.4 mg via INTRAVENOUS
  Filled 2013-09-04 (×16): qty 18.9

## 2013-09-04 MED ORDER — VANCOMYCIN HCL 500 MG IV SOLR
500.0000 mg | Freq: Two times a day (BID) | INTRAVENOUS | Status: DC
  Administered 2013-09-05 – 2013-09-07 (×5): 500 mg via INTRAVENOUS
  Filled 2013-09-04 (×8): qty 500

## 2013-09-04 MED ORDER — METOPROLOL TARTRATE 25 MG PO TABS
25.0000 mg | ORAL_TABLET | Freq: Every morning | ORAL | Status: DC
  Administered 2013-09-05 – 2013-09-12 (×8): 25 mg via ORAL
  Filled 2013-09-04 (×8): qty 1

## 2013-09-04 MED ORDER — ATORVASTATIN CALCIUM 20 MG PO TABS
20.0000 mg | ORAL_TABLET | ORAL | Status: DC
  Administered 2013-09-06 – 2013-09-10 (×2): 20 mg via ORAL
  Filled 2013-09-04 (×2): qty 1

## 2013-09-04 MED ORDER — ADULT MULTIVITAMIN W/MINERALS CH
1.0000 | ORAL_TABLET | Freq: Every day | ORAL | Status: DC
  Administered 2013-09-05 – 2013-09-12 (×8): 1 via ORAL
  Filled 2013-09-04 (×9): qty 1

## 2013-09-04 MED ORDER — VALACYCLOVIR HCL 500 MG PO TABS
500.0000 mg | ORAL_TABLET | Freq: Every day | ORAL | Status: DC
  Administered 2013-09-05 – 2013-09-12 (×8): 500 mg via ORAL
  Filled 2013-09-04 (×8): qty 1

## 2013-09-04 MED ORDER — ONDANSETRON HCL 4 MG PO TABS: 4.0000 mg | ORAL_TABLET | Freq: Four times a day (QID) | ORAL | Status: DC | PRN

## 2013-09-04 MED ORDER — ONDANSETRON HCL 4 MG/2ML IJ SOLN
4.0000 mg | Freq: Four times a day (QID) | INTRAMUSCULAR | Status: DC | PRN
  Administered 2013-09-10: 4 mg via INTRAVENOUS
  Filled 2013-09-04: qty 2

## 2013-09-04 MED ORDER — PIPERACILLIN-TAZOBACTAM 3.375 G IVPB 30 MIN
3.3750 g | Freq: Once | INTRAVENOUS | Status: AC
  Administered 2013-09-04: 3.375 g via INTRAVENOUS
  Filled 2013-09-04: qty 50

## 2013-09-04 MED ORDER — ACETAMINOPHEN 650 MG RE SUPP: 650.0000 mg | Freq: Four times a day (QID) | RECTAL | Status: DC | PRN

## 2013-09-04 MED ORDER — ENOXAPARIN SODIUM 40 MG/0.4ML ~~LOC~~ SOLN
40.0000 mg | SUBCUTANEOUS | Status: DC
  Administered 2013-09-04 – 2013-09-11 (×8): 40 mg via SUBCUTANEOUS
  Filled 2013-09-04 (×9): qty 0.4

## 2013-09-04 MED ORDER — PREDNISONE 50 MG PO TABS
60.0000 mg | ORAL_TABLET | Freq: Every day | ORAL | Status: DC
  Administered 2013-09-05: 60 mg via ORAL
  Filled 2013-09-04 (×2): qty 1

## 2013-09-04 MED ORDER — VITAMIN B-12 500 MCG PO TABS: 500.0000 ug | ORAL_TABLET | Freq: Every day | ORAL | Status: DC

## 2013-09-04 MED ORDER — ASPIRIN EC 81 MG PO TBEC
81.0000 mg | DELAYED_RELEASE_TABLET | Freq: Every evening | ORAL | Status: DC
  Administered 2013-09-04 – 2013-09-12 (×9): 81 mg via ORAL
  Filled 2013-09-04 (×9): qty 1

## 2013-09-04 MED ORDER — SODIUM CHLORIDE 0.9 % IV SOLN: 250.0000 mL | INTRAVENOUS | Status: DC | PRN

## 2013-09-04 MED ORDER — ALBUTEROL SULFATE (2.5 MG/3ML) 0.083% IN NEBU: 2.5000 mg | INHALATION_SOLUTION | RESPIRATORY_TRACT | Status: DC | PRN

## 2013-09-04 MED ORDER — SODIUM CHLORIDE 0.9 % IJ SOLN: 3.0000 mL | INTRAMUSCULAR | Status: DC | PRN

## 2013-09-04 MED ORDER — CYANOCOBALAMIN 500 MCG PO TABS
500.0000 ug | ORAL_TABLET | Freq: Every day | ORAL | Status: DC
  Administered 2013-09-05 – 2013-09-12 (×8): 500 ug via ORAL
  Filled 2013-09-04 (×9): qty 1

## 2013-09-04 MED ORDER — HYDROCODONE-ACETAMINOPHEN 5-325 MG PO TABS: 1.0000 | ORAL_TABLET | ORAL | Status: DC | PRN

## 2013-09-04 MED ORDER — NITROGLYCERIN 0.4 MG SL SUBL: 0.4000 mg | SUBLINGUAL_TABLET | SUBLINGUAL | Status: DC | PRN

## 2013-09-04 MED ORDER — DEXTROSE 5 % IV SOLN
1.0000 g | Freq: Three times a day (TID) | INTRAVENOUS | Status: DC
  Administered 2013-09-05 – 2013-09-06 (×5): 1 g via INTRAVENOUS
  Filled 2013-09-04 (×8): qty 1

## 2013-09-04 NOTE — ED Provider Notes (Addendum)
CSN: [id number]     Arrival date & time 09/04/13  1459 History   First MD Initiated Contact with Patient 09/04/13 1547     Chief Complaint  Patient presents with  . Shortness of Breath  . lung infection      (Consider location/radiation/quality/duration/timing/severity/associated sxs/prior Treatment) Patient is a 73 y.o. male presenting with shortness of breath. The history is provided by the patient. No language interpreter was used.  Shortness of Breath Severity:  Moderate Onset quality:  Gradual Duration:  2 weeks Timing:  Intermittent Progression:  Waxing and waning Chronicity:  New Relieved by:  Rest Worsened by:  Activity and exertion Ineffective treatments: Augmentin, prednisone. Associated symptoms: cough   Associated symptoms: no abdominal pain, no chest pain, no claudication, no fever, no headaches, no rash, no sputum production and no vomiting   Cough:    Cough characteristics:  Dry   Sputum characteristics:  Nondescript   Severity:  Moderate   Onset quality:  Gradual   Duration:  2 weeks   Timing:  Constant   Progression:  Unchanged   Chronicity:  New Risk factors: hx of cancer and hx of PE/DVT   Risk factors: no tobacco use     Past Medical History  Diagnosis Date  . Hypertension   . History of DVT of lower extremity     06/2001 as first sign of lymphoma: enlarged left inguinal nodes  . Low serum testosterone level 11/10/2011  . 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 Nocturia 11/10/2011  . Normochromic anemia 11/10/2011    Likely result of prior high dose chemotherapy  . Unexplained weight loss 05/03/2012  . Mitral valvular regurgitation 06/01/12    moderate to severe by echo- asymptomatic  . Dyslipidemia   . RBBB   . Coronary artery disease   . Family history of anesthesia complication     "son, Margreta Journey, larynx collapses" (10/11/2012)  . Heart murmur   . History of blood transfusion 2009-2010    "both related to his chemo" (10/11/2012)  .  Chronic renal insufficiency, stage III (moderate)   . Non Hodgkin's lymphoma 2003; 2009    "chemo; stem cell transplant & high powered chemo" (10/10/2012)  . Renal insufficiency   . Deep inguinal pain, right 05/26/2013  . Conjunctivitis, acute, right eye 06/28/2013   Past Surgical History  Procedure Laterality Date  . Bone marrow transplant  2010  . Cardiac catheterization  10/03/2012; 2007  . Coronary angioplasty with stent placement  10/11/2012    "2" (10/11/2012)  . Tonsillectomy  1950's  . Cataract extraction w/ intraocular lens implant Left 1990's  . Coronary artery bypass graft  03/29/2006    "CABG X5" (10/11/2012)   Family History  Problem Relation Age of Onset  . Heart failure Father   . Leukemia Brother 21  . Cancer - Lung Mother    History  Substance Use Topics  . Smoking status: Former Smoker -- 0.50 packs/day for 13 years    Types: Cigarettes    Quit date: 07/01/1969  . Smokeless tobacco: Never Used  . Alcohol Use: No    Review of Systems  Constitutional: Negative for fever, activity change, appetite change and fatigue.  HENT: Negative for congestion, facial swelling, rhinorrhea and trouble swallowing.   Eyes: Negative for photophobia and pain.  Respiratory: Positive for cough and shortness of breath. Negative for sputum production and chest tightness.   Cardiovascular: Negative for chest pain, claudication and leg swelling.  Gastrointestinal: Negative for nausea, vomiting,  abdominal pain, diarrhea and constipation.  Endocrine: Negative for polydipsia and polyuria.  Genitourinary: Negative for dysuria, urgency, decreased urine volume and difficulty urinating.  Musculoskeletal: Negative for back pain and gait problem.  Skin: Negative for color change, rash and wound.  Allergic/Immunologic: Negative for immunocompromised state.  Neurological: Negative for dizziness, facial asymmetry, speech difficulty, weakness, numbness and headaches.  Psychiatric/Behavioral: Negative  for confusion, decreased concentration and agitation.      Allergies  Niaspan  Home Medications   Prior to Admission medications   Medication Sig Start Date End Date Taking? Authorizing Provider  amoxicillin-clavulanate (AUGMENTIN) 875-125 MG per tablet Take 1 tablet by mouth 2 (two) times daily. 09/01/13  Yes Heath Lark, MD  aspirin EC 81 MG tablet Take 81 mg by mouth every evening.   Yes Historical Provider, MD  Calcium Carbonate-Vitamin D (CALCIUM 500 + D PO) Take 1 tablet by mouth daily.    Yes Historical Provider, MD  cholecalciferol (VITAMIN D) 1000 UNITS tablet Take 1,000 Units by mouth daily.    Yes Historical Provider, MD  dexamethasone (DECADRON) 4 MG tablet Take 8 mg by mouth daily. Take 2 tablets twice daily for 2 days after chemotherapy treatment   Yes Historical Provider, MD  fish oil-omega-3 fatty acids 1000 MG capsule Take 1 g by mouth daily.    Yes Historical Provider, MD  metoprolol tartrate (LOPRESSOR) 25 MG tablet Take 25 mg by mouth every morning. verified that it is not XL   Yes Historical Provider, MD  Multiple Vitamin (MULTIVITAMIN WITH MINERALS) TABS Take 1 tablet by mouth daily.   Yes Historical Provider, MD  Potassium 99 MG TABS Take 1 tablet by mouth daily.   Yes Historical Provider, MD  prasugrel (EFFIENT) 10 MG TABS tablet Take 10 mg by mouth every evening.   Yes Historical Provider, MD  predniSONE (DELTASONE) 20 MG tablet Take 3 tablets (60 mg total) by mouth daily with breakfast. 08/30/13  Yes Heath Lark, MD  rosuvastatin (CRESTOR) 10 MG tablet Take 10 mg by mouth 2 (two) times a week. On Wednesday and Sunday   Yes Historical Provider, MD  testosterone cypionate (DEPOTESTOTERONE CYPIONATE) 200 MG/ML injection Inject into the muscle every 14 (fourteen) days. 07/25/13  Yes Historical Provider, MD  valACYclovir (VALTREX) 1000 MG tablet Take 500-1,000 mg by mouth daily. Take 2 tablets (1000 mg) twice daily for 7 days then 500 mg daily.   Yes Historical Provider, MD   vitamin B-12 (CYANOCOBALAMIN) 500 MCG tablet Take 500 mcg by mouth daily.   Yes Historical Provider, MD  nitroGLYCERIN (NITROSTAT) 0.4 MG SL tablet Place 1 tablet (0.4 mg total) under the tongue every 5 (five) minutes as needed for chest pain. 10/04/12   Brittainy Simmons, PA-C  ondansetron (ZOFRAN ODT) 8 MG disintegrating tablet Take 1 by mouth twice daily for 2 days to start day after chemotherapy treatment then 1 by mouth every 8 hours as needed for nausea or vomiting 06/27/13   Annia Belt, MD   BP 133/64  Pulse 95  Temp(Src) 98.2 F (36.8 C) (Oral)  Resp 24  Ht 5\' 9"  (1.753 m)  Wt 133 lb (60.328 kg)  BMI 19.63 kg/m2  SpO2 97% Physical Exam  Constitutional: He is oriented to person, place, and time. He appears well-developed and well-nourished. No distress.  HENT:  Head: Normocephalic and atraumatic.  Mouth/Throat: No oropharyngeal exudate.  Eyes: Pupils are equal, round, and reactive to light.  Neck: Normal range of motion. Neck supple.  Cardiovascular: Regular rhythm  and normal heart sounds.  Tachycardia present.  Exam reveals no gallop and no friction rub.   No murmur heard. Pulmonary/Chest: Tachypnea noted. No respiratory distress. He has no wheezes. He has rhonchi in the left middle field and the left lower field. He has rales in the left middle field and the left lower field.  Abdominal: Soft. Bowel sounds are normal. He exhibits no distension and no mass. There is no tenderness. There is no rebound and no guarding.  Musculoskeletal: Normal range of motion. He exhibits no edema and no tenderness.  Neurological: He is alert and oriented to person, place, and time.  Skin: Skin is warm and dry.  Psychiatric: He has a normal mood and affect.    ED Course  Procedures (including critical care time) Labs Review Labs Reviewed  CBC WITH DIFFERENTIAL - Abnormal; Notable for the following:    WBC 14.5 (*)    RBC 3.22 (*)    Hemoglobin 11.1 (*)    HCT 31.3 (*)    MCH 34.5  (*)    RDW 18.5 (*)    Neutrophils Relative % 95 (*)    Neutro Abs 13.8 (*)    Lymphocytes Relative 3 (*)    Lymphs Abs 0.4 (*)    Monocytes Relative 2 (*)    All other components within normal limits  COMPREHENSIVE METABOLIC PANEL - Abnormal; Notable for the following:    Sodium 124 (*)    Chloride 85 (*)    Glucose, Bld 119 (*)    BUN 30 (*)    Total Protein 5.5 (*)    Albumin 2.6 (*)    AST 41 (*)    GFR calc non Af Amer 58 (*)    GFR calc Af Amer 68 (*)    All other components within normal limits  PRO B NATRIURETIC PEPTIDE - Abnormal; Notable for the following:    Pro B Natriuretic peptide (BNP) 3670.0 (*)    All other components within normal limits  CULTURE, BLOOD (ROUTINE X 2)  CULTURE, BLOOD (ROUTINE X 2)  PNEUMOCYSTIS JIROVECI SMEAR BY DFA  TSH  BASIC METABOLIC PANEL  CBC  LEGIONELLA ANTIGEN, URINE  STREP PNEUMONIAE URINARY ANTIGEN  I-STAT TROPOININ, ED  I-STAT CG4 LACTIC ACID, ED    Imaging Review Dg Chest 2 View  09/04/2013   CLINICAL DATA:  Shortness of breath.  EXAM: CHEST  2 VIEW  COMPARISON:  June 17, 2013.  FINDINGS: Stable cardiomediastinal silhouette. Status post coronary artery bypass graft. No pneumothorax or significant pleural effusion is noted. Interval development of bilateral perihilar and basilar interstitial densities are noted concerning for pulmonary edema.  IMPRESSION: Interval development of bilateral perihilar and basilar interstitial densities concerning for pulmonary edema or less likely pneumonia.   Electronically Signed   By: Sabino Dick M.D.   On: 09/04/2013 16:01   Ct Angio Chest Pe W/cm &/or Wo Cm  09/04/2013   CLINICAL DATA:  Shortness of breath, tachycardia, hypoxia. History of DVT.  EXAM: CT ANGIOGRAPHY CHEST WITH CONTRAST  TECHNIQUE: Multidetector CT imaging of the chest was performed using the standard protocol during bolus administration of intravenous contrast. Multiplanar CT image reconstructions and MIPs were obtained to  evaluate the vascular anatomy.  CONTRAST:  133mL OMNIPAQUE IOHEXOL 350 MG/ML SOLN  COMPARISON:  05/12/2013  FINDINGS: THORACIC INLET/BODY WALL:  Emphysema.  MEDIASTINUM:  Large volume of pneumomediastinum, extending into the neck.  No cardiomegaly. No pericardial effusion. Extensive atherosclerosis, including the coronary arteries. Status post CABG with  LIMA and upper venous graft widely patent. The graft associated with the lower ostium marker is not clearly visible, presumably chronic given history. There is occasional respiratory motion, which decreases sensitivity at the affected levels. Overall, exam is diagnostic. There is no evidence of acute pulmonary embolism. No evidence of esophageal thickening. No fluid collection. No adenopathy.  LUNG WINDOWS:  Small right apical pneumothorax, less than 5%.  There is dependent ground-glass and fine interstitial opacities. Changes are similar to CT 08/25/2013. No pleural effusion or interlobular septal thickening.  UPPER ABDOMEN:  Presumed cyst in the upper right liver, stable from previous imaging.  OSSEOUS:  No acute fracture.  No suspicious lytic or blastic lesions.  Critical Value/emergent results were called by telephone at the time of interpretation on 09/04/2013 at 5:34 PM to Dr. Jinny Blossom, [facility] , who verbally acknowledged these results.  Review of the MIP images confirms the above findings.  IMPRESSION: 1. Pneumomediastinum and trace right apical pneumothorax (less than 5%). This is likely from pulmonic air leak given #2. 2. Unchanged ground-glass opacities in the lower lungs, favoring an inflammatory (including drug reaction) pneumonitis or atypical infection. Given immunocompromised state (in the setting of lymphoma), consider PCP pneumonia - which can predispose to pneumothorax). 3. Negative for acute pulmonary embolism.   Electronically Signed   By: Jorje Guild M.D.   On: 09/04/2013 17:41     EKG Interpretation   Date/Time:  Monday Sep 04 2013  16:19:26 EDT Ventricular Rate:  98 PR Interval:  177 QRS Duration: 150 QT Interval:  402 QTC Calculation: 513 R Axis:   111 Text Interpretation:  Sinus rhythm Probable left atrial enlargement Right  bundle branch block No significant change since last tracing Confirmed by  Akhila Mahnken  MD, Milissa Fesperman ([id number]) on 09/04/2013 4:43:53 PM      MDM   Final diagnoses:  Pneumothorax on right  Pneumonia  Lymphoma  CAD (coronary artery disease)    Pt is a 73 y.o. male with pertinent PMHX of NHL, CAD, DVT who presents with continued fatigue, SOB since being dx w/ pna by PET scan about 10 days ago. He has not improved with about 10 d of Augmentin and 7 days of prednisone. On PE, pt hypoxia, 89% on RA, HR 103. Coarse breath sounds w/ crackles in BL mid & lower lobes. CXR w/ BL perihilar and basilar interstitial densities concerning for pulm edema or pna.   WBC elevated, Hb 11, Na 124, Cl 85, BUN 30, Cr 1.21. CTA ordered to r/o PE given hypoxia, tachycardia, hx cancer, was negative for PE, but did show pneumomediastinum and trace R apical PTX, as well as ground glass opacities in the lower lungs (pneumonitis vs atypical infection).  Vanc/zosyn ordered. I spoke w/ CT surgery who will see pt in the morning. Chest tube not required at this time given size, good response to O2.  Pt will be admitted [facility] with consult by CT surgery tomorrow morning.      Neta Ehlers, MD 09/04/13 [place], MD 09/14/13 1251

## 2013-09-04 NOTE — Telephone Encounter (Signed)
Made appt w/ [facility],  Dr. Elsworth Soho tomorrow 5/19 at 3 pm.  Instructed wife to take pt at 2:45 pm and gave her the office number to call if needed.  She verbalized understanding.

## 2013-09-04 NOTE — Telephone Encounter (Signed)
Wife reports pt has not had a fever x 5 days.  He continues to have "severe sob" with mild to moderate exertion.  Has to perform ADLs slowly and rest often.  He is eating well, but has "no energy."  He is taking the prednisone and antibiotic as prescribed.   She says pt doesn't want to go into hospital unless Dr. Alvy Bimler really feels it is necessary.  She will wait to hear about Pulmonary consult.

## 2013-09-04 NOTE — ED Notes (Signed)
Report given to [facility] 

## 2013-09-04 NOTE — ED Notes (Signed)
Patient's wife reports that the patient had a PET Scan on 08/25/13 that showed a lower lobe infection. Patient has been on Amox-Tr-k CLV 875-125 mg and Prednisone. Patient's wife states the patient has not had any improvement in his breathing. Patient's wife states that the patient has been afebrile since taking antibiotic. Patient is also taking chemo for non hodgkin's lymphoma in the right leg.

## 2013-09-04 NOTE — H&P (Addendum)
[facility] History and Physical  Willie [name] GYI:[id number] DOB: Nov 15, 1940 DOA: 09/04/2013  Referring physician: Tawnya Crook PCP: Horatio Pel, MD   Chief Complaint: shortness of breath  HPI: Willie [name] is a 73 y.o. male  With h/o NHL, on chemo presents with severe shortness of breath. No chest pain. Has noted mild ankle swelling for the past few months. Had PET scan on 08/25/2013 which showed findings consistent with pneumonia. Has been on Augmentin since then, prescribed by Dr. Alvy Bimler. Also there was concern of COPD so he was started on prednisone 60 mg daily for a week. Had fevers and scant cough. The fevers have largely resolved but he has severe dyspnea on exertion so presented to the emergency room. Chest x-ray showed interval development of bilateral perihilar and basilar interstitial densities concerning for pulmonary edema or less likely pneumonia.  CT angiogram of the chest showed large volume pneumomediastinum extending into the neck. No pulmonary embolus. Small right apical pneumothorax, less than 5%. Dependent groundglass opacity and fine interstitial opacities similar to CAT scan on 08/25/2013, consider atypical infection or PCP pneumonia. Pro BNP is 3000.  Oxygen saturations 89% on room air. Corrected with nasal cannula oxygen. No previous BNPs for comparison.  echocardiogram 2014 showed normal ejection fraction and moderate mitral valve prolapse with moderate regurgitation.  The patient has received oxygen, vancomycin, Zosyn in the emergency room. Dr. Tawnya Crook spoke with Dr. Lawson Fiscal, [facility]. No imminent need for procedure, but he will consult. Will therefore transfer to [facility].  Patient agreeable to plan.   Review of Systems:  Systems reviewed. As above, otherwise negative  Past Medical History  Diagnosis Date  . Hypertension   . History of DVT of lower extremity     06/2001 as first sign of lymphoma: enlarged left inguinal nodes    . Low serum testosterone level 11/10/2011  . 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 Nocturia 11/10/2011  . Normochromic anemia 11/10/2011    Likely result of prior high dose chemotherapy  . Unexplained weight loss 05/03/2012  . Mitral valvular regurgitation 06/01/12    moderate to severe by echo- asymptomatic  . Dyslipidemia   . RBBB   . Coronary artery disease   . Family history of anesthesia complication     "son, Margreta Journey, larynx collapses" (10/11/2012)  . Heart murmur   . History of blood transfusion 2009-2010    "both related to his chemo" (10/11/2012)  . Chronic renal insufficiency, stage III (moderate)   . Non Hodgkin's lymphoma 2003; 2009    "chemo; stem cell transplant & high powered chemo" (10/10/2012)  . Renal insufficiency   . Deep inguinal pain, right 05/26/2013  . Conjunctivitis, acute, right eye 06/28/2013   Past Surgical History  Procedure Laterality Date  . Bone marrow transplant  2010  . Cardiac catheterization  10/03/2012; 2007  . Coronary angioplasty with stent placement  10/11/2012    "2" (10/11/2012)  . Tonsillectomy  1950's  . Cataract extraction w/ intraocular lens implant Left 1990's  . Coronary artery bypass graft  03/29/2006    "CABG X5" (10/11/2012)   Social History:  reports that he quit smoking about 44 years ago. His smoking use included Cigarettes. He has a 6.5 pack-year smoking history. He has never used smokeless tobacco. He reports that he does not drink alcohol or use illicit drugs.  Allergies  Allergen Reactions  . Niaspan [Niacin Er] Rash    Family History  Problem Relation Age of Onset  .  Heart failure Father   . Leukemia Brother 21  . Cancer - Lung Mother      Prior to Admission medications   Medication Sig Start Date End Date Taking? Authorizing Provider  amoxicillin-clavulanate (AUGMENTIN) 875-125 MG per tablet Take 1 tablet by mouth 2 (two) times daily. 09/01/13  Yes Heath Lark, MD  aspirin EC 81 MG tablet Take 81 mg by mouth  every evening.   Yes Historical Provider, MD  Calcium Carbonate-Vitamin D (CALCIUM 500 + D PO) Take 1 tablet by mouth daily.    Yes Historical Provider, MD  cholecalciferol (VITAMIN D) 1000 UNITS tablet Take 1,000 Units by mouth daily.    Yes Historical Provider, MD  dexamethasone (DECADRON) 4 MG tablet Take 8 mg by mouth daily. Take 2 tablets twice daily for 2 days after chemotherapy treatment   Yes Historical Provider, MD  fish oil-omega-3 fatty acids 1000 MG capsule Take 1 g by mouth daily.    Yes Historical Provider, MD  metoprolol tartrate (LOPRESSOR) 25 MG tablet Take 25 mg by mouth every morning. verified that it is not XL   Yes Historical Provider, MD  Multiple Vitamin (MULTIVITAMIN WITH MINERALS) TABS Take 1 tablet by mouth daily.   Yes Historical Provider, MD  Potassium 99 MG TABS Take 1 tablet by mouth daily.   Yes Historical Provider, MD  prasugrel (EFFIENT) 10 MG TABS tablet Take 10 mg by mouth every evening.   Yes Historical Provider, MD  predniSONE (DELTASONE) 20 MG tablet Take 3 tablets (60 mg total) by mouth daily with breakfast. 08/30/13  Yes Heath Lark, MD  rosuvastatin (CRESTOR) 10 MG tablet Take 10 mg by mouth 2 (two) times a week. On Wednesday and Sunday   Yes Historical Provider, MD  testosterone cypionate (DEPOTESTOTERONE CYPIONATE) 200 MG/ML injection Inject into the muscle every 14 (fourteen) days. 07/25/13  Yes Historical Provider, MD  valACYclovir (VALTREX) 1000 MG tablet Take 500-1,000 mg by mouth daily. Take 2 tablets (1000 mg) twice daily for 7 days then 500 mg daily.   Yes Historical Provider, MD  vitamin B-12 (CYANOCOBALAMIN) 500 MCG tablet Take 500 mcg by mouth daily.   Yes Historical Provider, MD  nitroGLYCERIN (NITROSTAT) 0.4 MG SL tablet Place 1 tablet (0.4 mg total) under the tongue every 5 (five) minutes as needed for chest pain. 10/04/12   Brittainy Simmons, PA-C  ondansetron (ZOFRAN ODT) 8 MG disintegrating tablet Take 1 by mouth twice daily for 2 days to start day  after chemotherapy treatment then 1 by mouth every 8 hours as needed for nausea or vomiting 06/27/13   Annia Belt, MD   Physical Exam: Filed Vitals:   09/04/13 1745  BP: 133/76  Pulse: 84  Temp:   Resp: 21    BP 133/76  Pulse 84  Temp(Src) 98.1 F (36.7 C) (Oral)  Resp 21  SpO2 97%  BP 138/70  Pulse 82  Temp(Src) 98.1 F (36.7 C) (Oral)  Resp 21  Ht 5' 8.9" (1.75 m)  Wt 60.5 kg (133 lb 6.1 oz)  BMI 19.76 kg/m2  SpO2 100%  General Appearance:    Alert, breathing nonlabored, oriented.  Head:    Normocephalic, without obvious abnormality, atraumatic  Eyes:    PERRL, conjunctiva/corneas clear, EOM's intact, fundi    benign, both eyes          Nose:   Nares normal, septum midline, mucosa normal, no drainage   or sinus tenderness  Throat:   Lips, mucosa, and tongue normal; teeth  and gums normal  Neck:   Supple, symmetrical, trachea midline, no adenopathy;       thyroid:  No enlargement/tenderness/nodules; no carotid   bruit or JVD  Back:     Symmetric, no curvature, ROM normal, no CVA tenderness  Lungs:     Good air movement. Minimal rales at bases  Chest wall:    No tenderness or deformity  Heart:    Regular rate and rhythm, S1 and S2 normal, no murmur, rub   or gallop  Abdomen:     Soft, non-tender, bowel sounds active all four quadrants,    no masses, no organomegaly  Genitalia:    deferred  Rectal:    deferred  Extremities:   Extremities normal, atraumatic, no cyanosis or edema  Pulses:   2+ and symmetric all extremities  Skin:   Skin color, texture, turgor normal, no rashes or lesions  Lymph nodes:   Cervical, supraclavicular, and axillary nodes normal  Neurologic:   CNII-XII intact. Normal strength, sensation and reflexes      throughout             Psych:  Normal affect  Labs on Admission:  Basic Metabolic Panel:  Recent Labs Lab 08/30/13 1240 09/04/13 1610  NA 135* 124*  K 3.4* 3.8  CL  --  85*  CO2 26 27  GLUCOSE 102 119*  BUN 27.2*  30*  CREATININE 1.4* 1.21  CALCIUM 11.7* 9.3   Liver Function Tests:  Recent Labs Lab 08/30/13 1240 09/04/13 1610  AST 34 41*  ALT 32 26  ALKPHOS 96 88  BILITOT 0.45 0.5  PROT 6.2* 5.5*  ALBUMIN 2.9* 2.6*   No results found for this basename: LIPASE, AMYLASE,  in the last 168 hours No results found for this basename: AMMONIA,  in the last 168 hours CBC:  Recent Labs Lab 08/30/13 1240 09/04/13 1610  WBC 19.0 Repeated and Verified* 14.5*  NEUTROABS 17.2* 13.8*  HGB 11.1* 11.1*  HCT 32.0* 31.3*  MCV 99.4* 97.2  PLT 313 259   Cardiac Enzymes: No results found for this basename: CKTOTAL, CKMB, CKMBINDEX, TROPONINI,  in the last 168 hours  BNP (last 3 results)  Recent Labs  09/04/13 1610  PROBNP 3670.0*   CBG: No results found for this basename: GLUCAP,  in the last 168 hours  Radiological Exams on Admission: Dg Chest 2 View  09/04/2013   CLINICAL DATA:  Shortness of breath.  EXAM: CHEST  2 VIEW  COMPARISON:  June 17, 2013.  FINDINGS: Stable cardiomediastinal silhouette. Status post coronary artery bypass graft. No pneumothorax or significant pleural effusion is noted. Interval development of bilateral perihilar and basilar interstitial densities are noted concerning for pulmonary edema.  IMPRESSION: Interval development of bilateral perihilar and basilar interstitial densities concerning for pulmonary edema or less likely pneumonia.   Electronically Signed   By: Sabino Dick M.D.   On: 09/04/2013 16:01   Ct Angio Chest Pe W/cm &/or Wo Cm  09/04/2013   CLINICAL DATA:  Shortness of breath, tachycardia, hypoxia. History of DVT.  EXAM: CT ANGIOGRAPHY CHEST WITH CONTRAST  TECHNIQUE: Multidetector CT imaging of the chest was performed using the standard protocol during bolus administration of intravenous contrast. Multiplanar CT image reconstructions and MIPs were obtained to evaluate the vascular anatomy.  CONTRAST:  156mL OMNIPAQUE IOHEXOL 350 MG/ML SOLN  COMPARISON:   05/12/2013  FINDINGS: THORACIC INLET/BODY WALL:  Emphysema.  MEDIASTINUM:  Large volume of pneumomediastinum, extending into the neck.  No cardiomegaly.  No pericardial effusion. Extensive atherosclerosis, including the coronary arteries. Status post CABG with LIMA and upper venous graft widely patent. The graft associated with the lower ostium marker is not clearly visible, presumably chronic given history. There is occasional respiratory motion, which decreases sensitivity at the affected levels. Overall, exam is diagnostic. There is no evidence of acute pulmonary embolism. No evidence of esophageal thickening. No fluid collection. No adenopathy.  LUNG WINDOWS:  Small right apical pneumothorax, less than 5%.  There is dependent ground-glass and fine interstitial opacities. Changes are similar to CT 08/25/2013. No pleural effusion or interlobular septal thickening.  UPPER ABDOMEN:  Presumed cyst in the upper right liver, stable from previous imaging.  OSSEOUS:  No acute fracture.  No suspicious lytic or blastic lesions.  Critical Value/emergent results were called by telephone at the time of interpretation on 09/04/2013 at 5:34 PM to Dr. Jinny Blossom, [facility] , who verbally acknowledged these results.  Review of the MIP images confirms the above findings.  IMPRESSION: 1. Pneumomediastinum and trace right apical pneumothorax (less than 5%). This is likely from pulmonic air leak given #2. 2. Unchanged ground-glass opacities in the lower lungs, favoring an inflammatory (including drug reaction) pneumonitis or atypical infection. Given immunocompromised state (in the setting of lymphoma), consider PCP pneumonia - which can predispose to pneumothorax). 3. Negative for acute pulmonary embolism.   Electronically Signed   By: Jorje Guild M.D.   On: 09/04/2013 17:41    Assessment/Plan   Pneumonia, health care associated, r/o PCP in this immunocompromised patient with bilateral ground glass opacities and  pneumothorax/pneumomediastinum.  Give vanc, cefepime, trimethoprim/sulfa, continue prednisone.  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 Transfer to [facility] for CT surgery consult.  EDP spoke to Dr. Lawson Fiscal. Repeat CXR in am. Oxygen. 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 He had an initial complete response to 6 cycles of CHOP chemotherapy. He relapsed October 2009 with extensive infiltration of tumor into the right iliopsoas muscle presenting as an erythematous nodular cutaneous mass in the right inguinal region. No other disease outside of the muscle noted on PET scan. He again achieved remission with ICE chemotherapy and then went onto receive BCNU, etoposide and Cytoxan conditioning followed by autologous stem cell transplant at [facility] in January 2010. He was recently found to have a recurrence in the soft tissues of the right inguinal region. He began brentuximab on 06/28/2013."  Code Status: full Family Communication: wife at bedside Disposition Plan: admit to [facility] for CT surgery consult  Time spent: 60 minutes  Delfina Redwood, MD [facility] Pager [phone]

## 2013-09-04 NOTE — Progress Notes (Signed)
ANTIBIOTIC CONSULT NOTE - INITIAL  Pharmacy Consult for Vancomycin, Cefepime, Bactrim Indication: HCAP, rule out PCP  Allergies  Allergen Reactions  . Niaspan [Niacin Er] Rash    Patient Measurements: Height: 5' 8.9" (175 cm) Weight: 133 lb 6.1 oz (60.5 kg) IBW/kg (Calculated) : 70.47  Vital Signs: Temp: 98.1 F (36.7 C) (05/18 1501) Temp src: Oral (05/18 1501) BP: 110/58 mmHg (05/18 1930) Pulse Rate: 99 (05/18 1930) Intake/Output from previous day:   Intake/Output from this shift:    Labs:  Recent Labs  09/04/13 1610  WBC 14.5*  HGB 11.1*  PLT 259  CREATININE 1.21   Estimated Creatinine Clearance: 47.9 ml/min (by C-G formula based on Cr of 1.21). 56 ml/min/1.45m2 (normalized)  No results found for this basename: VANCOTROUGH, VANCOPEAK, VANCORANDOM, GENTTROUGH, GENTPEAK, GENTRANDOM, TOBRATROUGH, TOBRAPEAK, TOBRARND, AMIKACINPEAK, AMIKACINTROU, AMIKACIN,  in the last 72 hours   Microbiology: No results found for this or any previous visit (from the past 720 hour(s)).  Medical History: Past Medical History  Diagnosis Date  . Hypertension   . History of DVT of lower extremity     06/2001 as first sign of lymphoma: enlarged left inguinal nodes  . Low serum testosterone level 11/10/2011  . 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 Nocturia 11/10/2011  . Normochromic anemia 11/10/2011    Likely result of prior high dose chemotherapy  . Unexplained weight loss 05/03/2012  . Mitral valvular regurgitation 06/01/12    moderate to severe by echo- asymptomatic  . Dyslipidemia   . RBBB   . Coronary artery disease   . Family history of anesthesia complication     "son, Margreta Journey, larynx collapses" (10/11/2012)  . Heart murmur   . History of blood transfusion 2009-2010    "both related to his chemo" (10/11/2012)  . Chronic renal insufficiency, stage III (moderate)   . Non Hodgkin's lymphoma 2003; 2009    "chemo; stem cell transplant & high powered chemo" (10/10/2012)   . Renal insufficiency   . Deep inguinal pain, right 05/26/2013  . Conjunctivitis, acute, right eye 06/28/2013    Medications:  Scheduled:  . aspirin EC  81 mg Oral QPM  . [START ON 09/06/2013] atorvastatin  20 mg Oral Once per day on Sun Wed  . [START ON 09/05/2013] cholecalciferol  1,000 Units Oral Daily  . vitamin B-12  500 mcg Oral Daily  . enoxaparin (LOVENOX) injection  40 mg Subcutaneous Q24H  . furosemide  40 mg Intravenous Once  . [START ON 09/05/2013] metoprolol tartrate  25 mg Oral q morning - 10a  . multivitamin with minerals  1 tablet Oral Daily  . prasugrel  10 mg Oral QPM  . [START ON 09/05/2013] predniSONE  60 mg Oral Q breakfast  . sodium chloride  3 mL Intravenous Q12H  . [START ON 09/05/2013] valACYclovir  500 mg Oral Daily   Infusions:  . sodium chloride    . [START ON 09/05/2013] ceFEPime (MAXIPIME) IV    . sulfamethoxazole-trimethoprim     PRN: sodium chloride, acetaminophen, acetaminophen, albuterol, alum & mag hydroxide-simeth, HYDROcodone-acetaminophen, nitroGLYCERIN, ondansetron (ZOFRAN) IV, ondansetron, senna-docusate, sodium chloride  Assessment: 73 y.o. male with NHL, on chemo, presents 5/18 with severe shortness of breath. PET scan on 08/25/2013 which showed findings consistent with pneumonia. Has been on Augmentin since then, prescribed by Dr. Alvy Bimler. Also there was concern of COPD so he was started on prednisone 60 mg daily for a week. Pharmacy consulted to dose "all" antibiotics - Vanc, Cefepime, Bactrim.  5/18 >>  Vanc >> 5/18 >> Cefepime >> 5/18 >> Bactrim IV >>  Tmax: AF WBC: 14.5 Renal: SCr 1.21, CrCl 56N  5/18 PCP smear by DFA: ordered 5/18 blood x2: sent   Goal of Therapy:  Vancomycin trough level 15-20 mcg/ml Cefepime dose per renal function Bactrim dose per weight and renal function  Plan:   Vancomycin 1g IV given in ED, continue with 500mg  IV q12h and check trough at steady state  Cefepime 1g IV q8h  Bactrim 15 mg/kg/day IV  divided q8h (302 mg IV q8h) Follow up renal function & cultures   Peggyann Juba, PharmD, BCPS Pager: [phone] 09/04/2013,8:27 PM

## 2013-09-04 NOTE — ED Notes (Signed)
Attempted to call report, RN unavailable at this time. Instructed to call back in 10 minutes.

## 2013-09-04 NOTE — ED Notes (Signed)
MD at bedside. 

## 2013-09-05 ENCOUNTER — Telehealth: Payer: Self-pay | Admitting: *Deleted

## 2013-09-05 ENCOUNTER — Inpatient Hospital Stay (HOSPITAL_COMMUNITY): Payer: Medicare Other

## 2013-09-05 ENCOUNTER — Institutional Professional Consult (permissible substitution): Payer: Medicare Other | Admitting: Pulmonary Disease

## 2013-09-05 DIAGNOSIS — J189 Pneumonia, unspecified organism: Secondary | ICD-10-CM

## 2013-09-05 DIAGNOSIS — J9311 Primary spontaneous pneumothorax: Secondary | ICD-10-CM

## 2013-09-05 DIAGNOSIS — J9600 Acute respiratory failure, unspecified whether with hypoxia or hypercapnia: Secondary | ICD-10-CM

## 2013-09-05 DIAGNOSIS — J9383 Other pneumothorax: Secondary | ICD-10-CM

## 2013-09-05 LAB — BASIC METABOLIC PANEL
BUN: 24 mg/dL — ABNORMAL HIGH (ref 6–23)
CO2: 29 mEq/L (ref 19–32)
Calcium: 8.9 mg/dL (ref 8.4–10.5)
Chloride: 87 mEq/L — ABNORMAL LOW (ref 96–112)
Creatinine, Ser: 1.39 mg/dL — ABNORMAL HIGH (ref 0.50–1.35)
GFR, EST AFRICAN AMERICAN: 57 mL/min — AB (ref >90)
GFR, EST NON AFRICAN AMERICAN: 49 mL/min — AB (ref >90)
GLUCOSE: 101 mg/dL — AB (ref 70–99)
POTASSIUM: 3.2 meq/L — AB (ref 3.7–5.3)
Sodium: 128 mEq/L — ABNORMAL LOW (ref 137–147)

## 2013-09-05 LAB — CBC
HCT: 30.2 % — ABNORMAL LOW (ref 39.0–52.0)
HEMOGLOBIN: 10.9 g/dL — AB (ref 13.0–17.0)
MCH: 35.4 pg — ABNORMAL HIGH (ref 26.0–34.0)
MCHC: 36.1 g/dL — ABNORMAL HIGH (ref 30.0–36.0)
MCV: 98.1 fL (ref 78.0–100.0)
Platelets: 254 10*3/uL (ref 150–400)
RBC: 3.08 MIL/uL — ABNORMAL LOW (ref 4.22–5.81)
RDW: 18.5 % — ABNORMAL HIGH (ref 11.5–15.5)
WBC: 18.4 10*3/uL — ABNORMAL HIGH (ref 4.0–10.5)

## 2013-09-05 LAB — BLOOD GAS, ARTERIAL
Acid-Base Excess: 7.5 mmol/L — ABNORMAL HIGH (ref 0.0–2.0)
BICARBONATE: 30.8 meq/L — AB (ref 20.0–24.0)
Drawn by: 281201
O2 Content: 3 L/min
O2 SAT: 100 %
PO2 ART: 122 mmHg — AB (ref 80.0–100.0)
Patient temperature: 98.6
TCO2: 31.9 mmol/L (ref 0–100)
pCO2 arterial: 37.3 mmHg (ref 35.0–45.0)
pH, Arterial: 7.527 — ABNORMAL HIGH (ref 7.350–7.450)

## 2013-09-05 LAB — OSMOLALITY: OSMOLALITY: 265 mosm/kg — AB (ref 275–300)

## 2013-09-05 LAB — LEGIONELLA ANTIGEN, URINE: LEGIONELLA ANTIGEN, URINE: NEGATIVE

## 2013-09-05 LAB — PROCALCITONIN: Procalcitonin: 0.14 ng/mL

## 2013-09-05 LAB — HIV ANTIBODY (ROUTINE TESTING W REFLEX): HIV 1&2 Ab, 4th Generation: NONREACTIVE

## 2013-09-05 LAB — SODIUM, URINE, RANDOM: Sodium, Ur: 75 mEq/L

## 2013-09-05 LAB — OSMOLALITY, URINE: OSMOLALITY UR: 409 mosm/kg (ref 390–1090)

## 2013-09-05 LAB — TSH: TSH: 1.86 u[IU]/mL (ref 0.350–4.500)

## 2013-09-05 LAB — SEDIMENTATION RATE: SED RATE: 33 mm/h — AB (ref 0–16)

## 2013-09-05 LAB — STREP PNEUMONIAE URINARY ANTIGEN: STREP PNEUMO URINARY ANTIGEN: NEGATIVE

## 2013-09-05 LAB — GLUCOSE, CAPILLARY: Glucose-Capillary: 175 mg/dL — ABNORMAL HIGH (ref 70–99)

## 2013-09-05 MED ORDER — DEXTROSE-NACL 5-0.9 % IV SOLN
INTRAVENOUS | Status: DC
  Administered 2013-09-05: 02:00:00 via INTRAVENOUS

## 2013-09-05 MED ORDER — METHYLPREDNISOLONE SODIUM SUCC 125 MG IJ SOLR
60.0000 mg | Freq: Two times a day (BID) | INTRAMUSCULAR | Status: DC
  Administered 2013-09-05 – 2013-09-06 (×3): 60 mg via INTRAVENOUS
  Filled 2013-09-05 (×2): qty 0.96
  Filled 2013-09-05: qty 2
  Filled 2013-09-05 (×2): qty 0.96

## 2013-09-05 MED ORDER — IOHEXOL 300 MG/ML  SOLN
150.0000 mL | Freq: Once | INTRAMUSCULAR | Status: AC | PRN
  Administered 2013-09-05: 50 mL via ORAL

## 2013-09-05 NOTE — Progress Notes (Signed)
pneumomediastinum 5% right apical pneumothorax Bi- basilar groundglass density is-pneumonitis   Subjective: Patient examined and a CT chest reviewed The patient presented to the ED with shortness of breath and low oxygen saturation. No history of emesis choking gagging, chest or neck pain The patient has a long history of anaplastic non-Hodgkin's lymphoma status post stem cell transplant at [facility]. He is done well until January of this year when he developed a recurrence in his right inguinal modes and is currently receiving IV chemotherapy at the cancer center every 3 weeks. After the most recent therapy he developed shortness of breath and weakness and malaise. No productive cough or fever. He has lost weight.  Patient is also status post CABG x5 in 2007, status post PCI stents last year currently on effient antiplatelet agent. He states his EF is normal.  Chest x-ray now shows fairly significant pneumomediastinum, small apical pneumothorax, and significant bibasilar densities consistent with drug reaction or pneumonitis. He is been treated for Supplemental oxygen and IV antibiotics Objective: Vital signs in last 24 hours: Temp:  [98 F (36.7 C)-98.6 F (37 C)] 98 F (36.7 C) (05/19 0416) Pulse Rate:  [77-103] 96 (05/19 0416) Cardiac Rhythm:  [-]  Resp:  [14-43] 22 (05/19 0416) BP: (100-151)/(58-95) 142/75 mmHg (05/19 0416) SpO2:  [85 %-100 %] 91 % (05/19 0416) Weight:  [133 lb (60.328 kg)-133 lb 6.1 oz (60.5 kg)] 133 lb (60.328 kg) (05/18 2039)  Hemodynamic parameters for last 24 hours:   afebrile sinus rhythm Intake/Output from previous day: 05/18 0701 - 05/19 0700 In: -  Out: 1025 [Urine:1025] Intake/Output this shift:    Exam Thin chronically ill-appearing male no distress HEENT normocephalic dentition good No crepitus in the neck Breath sounds with dry rales Heart rhythm regular Abdomen benign Neuro intact  Lab Results:  Recent Labs  09/04/13 1610  WBC  14.5*  HGB 11.1*  HCT 31.3*  PLT 259   BMET:  Recent Labs  09/04/13 1610  NA 124*  K 3.8  CL 85*  CO2 27  GLUCOSE 119*  BUN 30*  CREATININE 1.21  CALCIUM 9.3    PT/INR: No results found for this basename: LABPROT, INR,  in the last 72 hours ABG No results found for this basename: phart, pco2, po2, hco3, tco2, acidbasedef, o2sat   CBG (last 3)  No results found for this basename: GLUCAP,  in the last 72 hours  Assessment/Plan: S/P   #1 significant pneumomediastinum around the esophagus-we'll obtain Gastrografin swallow to rule out leak. If study is negative patient can resume his regular diet #2 apical pneumothorax with significant bibasilar infiltrates-densities, continue antibiotics and serial x-rays to follow pneumothorax.   LOS: 1 day    Tharon Aquas [facility] 09/05/2013

## 2013-09-05 NOTE — Consult Note (Addendum)
Name: TAVIAN CALLANDER MRN: [id number] DOB: 07/30/1940    ADMISSION DATE:  09/04/2013 CONSULTATION DATE:  09/04/2013  REFERRING MD :  Sloan Leiter PRIMARY SERVICE:  Internal  CHIEF COMPLAINT:  PTX, Pneumomediastinum, GGO's.  BRIEF PATIENT DESCRIPTION: 73 y.o. w PMH of NHL, currently on chemo, presented 5/18 with severe SOB.  In ED, found to have small 5% right PTX, pneumomediastinum, dependent GGO's.  PCCM was consulted.  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 Ruz is a 73 y.o. With PMH including but not limited to ALk pos NHL (had completed CHOP chemo x 6 rounds, stem cell transplant, BCNU, etoposide, cytoxan, began brentuximab 06/28/2013 for inguinal recurrence). PEt scan showed new GGO s/o infection, started on oral abx & prednisone by onc Alvy Bimler) He was in his [place] until 5/18 when he developed severe SOB mainly with exertion prompting him to go to the ED.  He has had mild dry cough as well as some lower extremity edema.  No fever/chills/sweats, chest pain, N/V/D.  In ED, CTA was negative for PE; however, did reveal large volume pneumomediastinum extending into the neck, a small right apical PTX (< 5%), and dependent GGO's and fine interstitial opacities that were similar to PET scan done on 08/25/13.  Findings were concerning for atypical PNA vs PCP given his immunocompromised state.  PCCM was consulted for further recs.  PAST MEDICAL HISTORY :  Past Medical History  Diagnosis Date  .  Hypertension   . History of DVT of lower extremity     06/2001 as first sign of lymphoma: enlarged left inguinal nodes  . Low serum testosterone level 11/10/2011  . 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 Nocturia 11/10/2011  . Normochromic anemia 11/10/2011    Likely result of prior high dose chemotherapy  . Unexplained weight loss 05/03/2012  . Mitral valvular regurgitation 06/01/12    moderate to severe by echo- asymptomatic  . Dyslipidemia   . RBBB   . Coronary artery disease   . Family history of anesthesia complication     "son, Margreta Journey, larynx collapses" (10/11/2012)  . Heart murmur   . History of blood transfusion 2009-2010    "both related to his chemo" (10/11/2012)  . Chronic renal insufficiency, stage III (moderate)   . Non Hodgkin's lymphoma 2003; 2009    "chemo; stem cell transplant & high powered chemo" (10/10/2012)  . Renal insufficiency   . Deep inguinal pain, right 05/26/2013  . Conjunctivitis, acute, right eye 06/28/2013   Past Surgical History  Procedure Laterality Date  . Bone marrow transplant  2010  . Cardiac catheterization  10/03/2012; 2007  . Coronary angioplasty with stent placement  10/11/2012    "2" (10/11/2012)  . Tonsillectomy  1950's  . Cataract extraction w/ intraocular lens implant Left 1990's  . Coronary artery bypass graft  03/29/2006    "CABG X5" (10/11/2012)   Prior to Admission medications   Medication Sig Start Date End Date Taking? Authorizing Provider  amoxicillin-clavulanate (AUGMENTIN)  875-125 MG per tablet Take 1 tablet by mouth 2 (two) times daily. 09/01/13  Yes Heath Lark, MD  aspirin EC 81 MG tablet Take 81 mg by mouth every evening.   Yes Historical Provider, MD  Calcium Carbonate-Vitamin D (CALCIUM 500 + D PO) Take 1 tablet by mouth daily.    Yes Historical Provider, MD  cholecalciferol (VITAMIN D) 1000 UNITS tablet Take 1,000 Units by mouth daily.    Yes Historical Provider, MD  dexamethasone (DECADRON) 4 MG tablet Take 8 mg by mouth  daily. Take 2 tablets twice daily for 2 days after chemotherapy treatment   Yes Historical Provider, MD  fish oil-omega-3 fatty acids 1000 MG capsule Take 1 g by mouth daily.    Yes Historical Provider, MD  metoprolol tartrate (LOPRESSOR) 25 MG tablet Take 25 mg by mouth every morning. verified that it is not XL   Yes Historical Provider, MD  Multiple Vitamin (MULTIVITAMIN WITH MINERALS) TABS Take 1 tablet by mouth daily.   Yes Historical Provider, MD  Potassium 99 MG TABS Take 1 tablet by mouth daily.   Yes Historical Provider, MD  prasugrel (EFFIENT) 10 MG TABS tablet Take 10 mg by mouth every evening.   Yes Historical Provider, MD  predniSONE (DELTASONE) 20 MG tablet Take 3 tablets (60 mg total) by mouth daily with breakfast. 08/30/13  Yes Heath Lark, MD  rosuvastatin (CRESTOR) 10 MG tablet Take 10 mg by mouth 2 (two) times a week. On Wednesday and Sunday   Yes Historical Provider, MD  testosterone cypionate (DEPOTESTOTERONE CYPIONATE) 200 MG/ML injection Inject into the muscle every 14 (fourteen) days. 07/25/13  Yes Historical Provider, MD  valACYclovir (VALTREX) 1000 MG tablet Take 500-1,000 mg by mouth daily. Take 2 tablets (1000 mg) twice daily for 7 days then 500 mg daily.   Yes Historical Provider, MD  vitamin B-12 (CYANOCOBALAMIN) 500 MCG tablet Take 500 mcg by mouth daily.   Yes Historical Provider, MD  nitroGLYCERIN (NITROSTAT) 0.4 MG SL tablet Place 1 tablet (0.4 mg total) under the tongue every 5 (five) minutes as needed for chest pain. 10/04/12   Brittainy Simmons, PA-C  ondansetron (ZOFRAN ODT) 8 MG disintegrating tablet Take 1 by mouth twice daily for 2 days to start day after chemotherapy treatment then 1 by mouth every 8 hours as needed for nausea or vomiting 06/27/13   Annia Belt, MD   Allergies  Allergen Reactions  . Niaspan [Niacin Er] Rash    FAMILY HISTORY:  Family History  Problem Relation Age of Onset  . Heart failure Father   . Leukemia Brother 21  . Cancer -  Lung Mother    SOCIAL HISTORY:  reports that he quit smoking about 44 years ago. His smoking use included Cigarettes. He has a 6.5 pack-year smoking history. He has never used smokeless tobacco. He reports that he does not drink alcohol or use illicit drugs.  REVIEW OF SYSTEMS:   Constitutional: Negative for fever, chills, weight loss, malaise/fatigue and diaphoresis.  HENT: Negative for hearing loss, ear pain, nosebleeds, congestion, sore throat, neck pain, tinnitus and ear discharge.   Eyes: Negative for blurred vision, double vision, photophobia, pain, discharge and redness.  Respiratory: Negative for cough, hemoptysis, sputum production,  wheezing and stridor.  + shortness of breath Cardiovascular: Negative for chest pain, palpitations, orthopnea, claudication,  and PND. + leg swelling Gastrointestinal: Negative for heartburn, nausea, vomiting, abdominal pain, diarrhea, constipation, blood in stool and melena.  Genitourinary: Negative for dysuria, urgency, frequency, hematuria  and flank pain.  Musculoskeletal: Negative for myalgias, back pain, joint pain and falls.  Skin: Negative for itching and rash.  Neurological: Negative for dizziness, tingling, tremors, sensory change, speech change, focal weakness, seizures, loss of consciousness, weakness and headaches.  Endo/Heme/Allergies: Negative for environmental allergies and polydipsia. Does not bruise/bleed easily.  SUBJECTIVE: Breathing improved since symptom onset.  No chest pain, wheeze, cough, fevers/chills/sweats.  VITAL SIGNS: Temp:  [98 F (36.7 C)-98.6 F (37 C)] 98 F (36.7 C) (05/19 0416) Pulse Rate:  [77-103] 96 (05/19 0416) Resp:  [14-43] 22 (05/19 0416) BP: (100-151)/(58-95) 142/75 mmHg (05/19 0416) SpO2:  [85 %-100 %] 91 % (05/19 0416) Weight:  [60.328 kg (133 lb)-60.5 kg (133 lb 6.1 oz)] 60.328 kg (133 lb) (05/18 2039)  PHYSICAL EXAMINATION: General: Pleasant male, resting in bed, in NAD. Neuro: A&O x 3, non-focal.    HEENT: [place]/AT. PERRL, sclerae anicteric. Cardiovascular: RRR, 3/6 PSM.at apex Lungs: Respirations even and unlabored.  CTA bilaterally, No W/R/R.  Radom in place. Abdomen: BS x 4, soft, NT/ND.  Musculoskeletal: No gross deformities, no edema.  Skin: Intact, warm, no rashes.     Recent Labs Lab 08/30/13 1240 09/04/13 1610  NA 135* 124*  K 3.4* 3.8  CL  --  85*  CO2 26 27  BUN 27.2* 30*  CREATININE 1.4* 1.21  GLUCOSE 102 119*    Recent Labs Lab 08/30/13 1240 09/04/13 1610  HGB 11.1* 11.1*  HCT 32.0* 31.3*  WBC 19.0 Repeated and Verified* 14.5*  PLT 313 259   Dg Chest 2 View  09/04/2013   CLINICAL DATA:  Shortness of breath.  EXAM: CHEST  2 VIEW  COMPARISON:  June 17, 2013.  FINDINGS: Stable cardiomediastinal silhouette. Status post coronary artery bypass graft. No pneumothorax or significant pleural effusion is noted. Interval development of bilateral perihilar and basilar interstitial densities are noted concerning for pulmonary edema.  IMPRESSION: Interval development of bilateral perihilar and basilar interstitial densities concerning for pulmonary edema or less likely pneumonia.   Electronically Signed   By: Sabino Dick M.D.   On: 09/04/2013 16:01   Ct Angio Chest Pe W/cm &/or Wo Cm  09/04/2013   CLINICAL DATA:  Shortness of breath, tachycardia, hypoxia. History of DVT.  EXAM: CT ANGIOGRAPHY CHEST WITH CONTRAST  TECHNIQUE: Multidetector CT imaging of the chest was performed using the standard protocol during bolus administration of intravenous contrast. Multiplanar CT image reconstructions and MIPs were obtained to evaluate the vascular anatomy.  CONTRAST:  121m OMNIPAQUE IOHEXOL 350 MG/ML SOLN  COMPARISON:  05/12/2013  FINDINGS: THORACIC INLET/BODY WALL:  Emphysema.  MEDIASTINUM:  Large volume of pneumomediastinum, extending into the neck.  No cardiomegaly. No pericardial effusion. Extensive atherosclerosis, including the coronary arteries. Status post CABG with LIMA and  upper venous graft widely patent. The graft associated with the lower ostium marker is not clearly visible, presumably chronic given history. There is occasional respiratory motion, which decreases sensitivity at the affected levels. Overall, exam is diagnostic. There is no evidence of acute pulmonary embolism. No evidence of esophageal thickening. No fluid collection. No adenopathy.  LUNG WINDOWS:  Small right apical pneumothorax, less than 5%.  There is dependent ground-glass and fine interstitial opacities. Changes are similar to CT 08/25/2013. No pleural effusion or interlobular septal thickening.  UPPER ABDOMEN:  Presumed cyst in the upper right liver, stable from previous imaging.  OSSEOUS:  No acute fracture.  No suspicious lytic or blastic lesions.  Critical Value/emergent results were called by telephone at the  time of interpretation on 09/04/2013 at 5:34 PM to Dr. Jinny Blossom, [facility] , who verbally acknowledged these results.  Review of the MIP images confirms the above findings.  IMPRESSION: 1. Pneumomediastinum and trace right apical pneumothorax (less than 5%). This is likely from pulmonic air leak given #2. 2. Unchanged ground-glass opacities in the lower lungs, favoring an inflammatory (including drug reaction) pneumonitis or atypical infection. Given immunocompromised state (in the setting of lymphoma), consider PCP pneumonia - which can predispose to pneumothorax). 3. Negative for acute pulmonary embolism.   Electronically Signed   By: Jorje Guild M.D.   On: 09/04/2013 17:41   Dg Chest Port 1 View  09/05/2013   CLINICAL DATA:  Pneumothorax, short of breath, nausea  EXAM: PORTABLE CHEST - 1 VIEW  COMPARISON:  Prior chest x-ray and CT chest 09/04/2013  FINDINGS: Stable trace right apical pneumothorax. The mediastinal air is again a present and tracks into the soft tissues of the right greater than left neck. No significant interval exacerbation. Stable cardiac and mediastinal contours. Patient is  status post median sternotomy with evidence of prior multivessel CABG including LIMA bypass. Similar to slightly increased left greater than right bibasilar patchy airspace opacities. No pleural effusion. No acute osseous abnormality.  IMPRESSION: 1. Stable pneumomediastinum in trace right apical pneumothorax. 2. Similar to slightly increased left greater than right bibasilar opacities. Differential considerations remain unchanged including pneumonitis and atypical infection.   Electronically Signed   By: Jacqulynn Cadet M.D.   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 Lit review does show a 1-10% incidence of pneumonitis with brentuximab (anti CD 30 agent) Concern for  PCP PNA in immunocompromised host vs HCAP Small right apical PTX ,Pneumomediastinum -cause of this is unclear RECS: - Continue empiric abx for now, narrow as able. -Increase solumedrol 60 q 12h with CBG monitoring - F/u PCP smear and blood cultures. - Supplemental O2 as needed, wean as able. - CVTS following, have ordered gastrografin swallow for further eval of pneumomediastinum. - May need changes made to chemo regimen, defer to Oncology. -Chk ESR, pct -unfortunately with apical pneumothx, cannot perform bronchoscopy , hence manage conservatively   Mod MR - followed by Dr Gwenlyn Found Agree with diuresis per BNP Will follow,  Kara Mead MD. FCCP. [facility] Pager [phone] If no response call [phone]

## 2013-09-05 NOTE — Progress Notes (Signed)
Patient ID: Willie [name], male   DOB: [birth year], 73 y.o.   MRN: [id number] Hematology courtesy visit  Pleasant 73 year old man who was under my care for many years for ALK positive T cell lymphoma initially presenting with a left lower extremity DVT secondary to bulky left inguinal lymphadenopathy. He had an initial complete response to CHOP chemotherapy. He had an unusual recurrence in 2009 with muscle infiltrating disease in the area of the right iliopsoas muscle . He was reinduced into remission with ICE chemotherapy followed by high dose chemotherapy with autologous stem cell support given at [facility]. He had a second recurrence recently also invading muscle and the right inguinal area with initial nondiagnostic biopsy on 06/15/2013 with subsequent diagnostic biopsy on March 4. He was started on a chemo immunotherapeutic agent,   brentuximab, which combines an antibody to cell surface markers CD30 with a spindle cell poison. He has had 3 doses most recently been on April 22. Overall he tolerated the product well with no side effects for the first 2 treatments and evidence of a rapid clinical response with regression of the soft tissue pain and swelling in the right inguinal region. Wife states he did not do as well with the third treatment primarily due to an increasing fatigue.  He now presents with a two-week history of the indolent onset of dyspnea and low-grade fevers not higher than 100.4. Increasing fatigue. No  Cough. No hemoptysis. He received initial oral antibiotics as an outpatient. Fevers resolved blood dyspnea persisted and progressed. A restaging PET scan done on May 8 showed progression of the lymphoma but new bilateral lower lobe groundglass infiltrates in his lungs. He was started on a prednisone 60 mg daily. Arrangements were made for him to be seen by pulmonary medicine today. However, 2 to rapidly progressive dyspnea wife was concerned and brought him to the emergency  department and he was admitted here yesterday. A CT scan of the chest confirmed extensive bilateral interstitial infiltrates. There was a small 5% right apical pneumothorax. There was unexplained pneumomediastinum.  Steroids have been continued current dose of Solu-Medrol 60 mg IV every 12 hours. He was started on broad-spectrum antibiotics including Zosyn, vancomycin, Bactrim to cover PCP, and he continues on oral Valtrex antiviral prophylaxis.  The differential at this point favors chemical pneumonitis from the Brentuximab but opportunistic infection cannot be excluded in this highly immunocompromised patient.  I reassured his wife that he is receiving optimal care by his inpatient team but also told her that this is a serious and life-threatening situation. His primary care physician is Dr. Deland Pretty. His oncologist is Dr. Alvy Bimler.  I am available to provide emotional support to the family.

## 2013-09-05 NOTE — Telephone Encounter (Signed)
Wife left VM informing pt became more SOB yesterday and she took him to the ED.  He was admitted to [facility].   She wants Dr. Alvy Bimler to be aware and follow his care if possible.

## 2013-09-05 NOTE — Progress Notes (Signed)
PATIENT DETAILS Name: Willie [name] Age: 73 y.o. Sex: male Date of Birth: [birth year] Admit Date: 09/04/2013 Admitting Physician Delfina Redwood, MD YTK:PTWSF,KCLEXN DAVIDSON, MD  Subjective: Admitted with exertional dyspnea and fever.  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 Do not think that this is CHF although BNP elevated-therefore will check Echo -already on prednisone as outpatient-has been continued, on empiric antibiotics to cover HCAP (Vanco/Cefepime started on 5/18) and possible Pneumocystis PNA -as immunocompromised and pneumothorax (On bactrim started on 5/18) -check sputum PCP smear, check HIV -have asked PCCM to consult  Hyponatremia -repeat BMET today, clinically euvolemic-?SIADH from lung opacties-check Serum/Urine osm, Urine Na. Monitor and follow.  Severe Mitral Regurgitation -check Echo-but suspect Shortness of breath secondary to above issues.  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 He had an initial complete response to 6 cycles of CHOP chemotherapy. He relapsed October 2009 with extensive infiltration of tumor into the right iliopsoas muscle presenting as an erythematous nodular cutaneous mass in the right inguinal region. No other disease outside of the muscle  noted on PET scan. He again achieved remission with ICE chemotherapy and then went onto receive BCNU, etoposide and Cytoxan conditioning followed by autologous stem cell transplant at [facility] in January 2010. He was recently found to have a recurrence in the soft tissues of the right inguinal region. He began brentuximab on 06/28/2013."If above symptoms deemed to be drug related -may need to change regimen.  Hx of CAD-s/p CABG in 2007, and PCI in June 2014 -c/w ASA/Effient/Metoprolol and statins -SOB likely secondary to above, no chest pain.  Disposition: Remain inpatient  DVT Prophylaxis: Prophylactic Lovenox  Code Status: Full code   Family Communication Spouse at bedside  Procedures:  None  CONSULTS:  pulmonary/intensive care and CTVS  Time spent 40 minutes-which includes 50% of the time with face-to-face with patient/ family and coordinating care related to the above assessment and plan.    MEDICATIONS: Scheduled Meds: . aspirin EC  81 mg Oral QPM  . [START ON 09/06/2013] atorvastatin  20 mg Oral Once per day on Sun Wed  . ceFEPime (MAXIPIME) IV  1 g Intravenous Q8H  . cholecalciferol  1,000 Units Oral Daily  . vitamin B-12  500 mcg Oral Daily  . enoxaparin (LOVENOX) injection  40 mg Subcutaneous Q24H  . metoprolol tartrate  25 mg Oral q morning - 10a  . multivitamin with minerals  1 tablet Oral Daily  . prasugrel  10 mg Oral QPM  . predniSONE  60 mg Oral Q breakfast  . sodium chloride  3 mL Intravenous Q12H  . sulfamethoxazole-trimethoprim  15 mg/kg/day Intravenous 3 times per day  . valACYclovir  500 mg Oral Daily  . vancomycin  500 mg Intravenous Q12H   Continuous Infusions: . dextrose 5 % and 0.9% NaCl 50 mL/hr at 09/05/13 0157   PRN Meds:.sodium chloride, acetaminophen, acetaminophen, albuterol, alum & mag hydroxide-simeth, HYDROcodone-acetaminophen, nitroGLYCERIN, ondansetron (ZOFRAN)  IV, ondansetron, senna-docusate, sodium  chloride  Antibiotics: Anti-infectives   Start     Dose/Rate Route Frequency Ordered Stop   09/05/13 1000  valACYclovir (VALTREX) tablet 500 mg     500 mg Oral Daily 09/04/13 2012     09/05/13 0800  vancomycin (VANCOCIN) 500 mg in sodium chloride 0.9 % 100 mL IVPB     500 mg 100 mL/hr over 60 Minutes Intravenous Every 12 hours 09/04/13 2032     09/05/13 0200  ceFEPIme (MAXIPIME) 1 g in dextrose 5 % 50 mL IVPB     1 g 100 mL/hr over 30 Minutes Intravenous Every 8 hours 09/04/13 2012     09/04/13 2100  sulfamethoxazole-trimethoprim (BACTRIM) 302.4 mg in dextrose 5 % 250 mL IVPB     15 mg/kg/day  60.5 kg 268.9 mL/hr over 60 Minutes Intravenous 3 times per day 09/04/13 2001     09/04/13 1800  vancomycin (VANCOCIN) IVPB 1000 mg/200 mL premix     1,000 mg 200 mL/hr over 60 Minutes Intravenous  Once 09/04/13 1748 09/04/13 1952   09/04/13 1800  piperacillin-tazobactam (ZOSYN) IVPB 3.375 g     3.375 g 100 mL/hr over 30 Minutes Intravenous  Once 09/04/13 1748 09/04/13 1849       PHYSICAL EXAM: Vital signs in last 24 hours: Filed Vitals:   09/04/13 2216 09/04/13 2230 09/04/13 2344 09/05/13 0416  BP: 133/64 136/67 136/70 142/75  Pulse: 95 87 101 96  Temp: 98.2 F (36.8 C)  98.6 F (37 C) 98 F (36.7 C)  TempSrc: Oral  Oral Oral  Resp: _0 Height:      Weight:      SpO2: 97% 99% 98% 91%    Weight change:  Filed Weights   09/04/13 1948 09/04/13 2039  Weight: 60.5 kg (133 lb 6.1 oz) 60.328 kg (133 lb)   Body mass index is 19.63 kg/(m^2).   Gen Exam: Awake and alert with clear speech.   Neck: Supple, No JVD.   Chest: B/L basal rales CVS: S1 S2 Regular, no murmurs.  Abdomen: soft, BS +, non tender, non distended.  Extremities: no edema, lower extremities warm to touch. Neurologic: Non Focal.   Skin: No Rash.   Wounds: N/A.   Intake/Output from previous day:  Intake/Output Summary (Last 24 hours) at 09/05/13 0947 Last data filed at 09/05/13 0754  Gross per 24  hour  Intake      0 ml  Output   1175 ml  Net  -1175 ml     LAB RESULTS: CBC  Recent Labs Lab 08/30/13 1240 09/04/13 1610  WBC 19.0 Repeated and Verified* 14.5*  HGB 11.1* 11.1*  HCT 32.0* 31.3*  PLT 313 259  MCV 99.4* 97.2  MCH 34.5* 34.5*  MCHC 34.7 35.5  RDW 19.0* 18.5*  LYMPHSABS 1.0 0.4*  MONOABS 0.7 0.2  EOSABS 0.1 0.0  BASOSABS 0.0 0.0    Chemistries   Recent Labs Lab 08/30/13 1240 09/04/13 1610  NA 135* 124*  K 3.4* 3.8  CL  --  85*  CO2 26 27  GLUCOSE 102 119*  BUN 27.2* 30*  CREATININE 1.4* 1.21  CALCIUM 11.7* 9.3    CBG: No results found for this basename: GLUCAP,  in the last 168 hours  GFR Estimated Creatinine Clearance: 47.8 ml/min (by C-G formula based on Cr of 1.21).  Coagulation profile No results found for this basename: INR, PROTIME,  in the last 168 hours  Cardiac Enzymes No results  found for this basename: CK, CKMB, TROPONINI, MYOGLOBIN,  in the last 168 hours  No components found with this basename: POCBNP,  No results found for this basename: DDIMER,  in the last 72 hours No results found for this basename: HGBA1C,  in the last 72 hours No results found for this basename: CHOL, HDL, LDLCALC, TRIG, CHOLHDL, LDLDIRECT,  in the last 72 hours No results found for this basename: TSH, T4TOTAL, FREET3, T3FREE, THYROIDAB,  in the last 72 hours No results found for this basename: VITAMINB12, FOLATE, FERRITIN, TIBC, IRON, RETICCTPCT,  in the last 72 hours No results found for this basename: LIPASE, AMYLASE,  in the last 72 hours  Urine Studies No results found for this basename: UACOL, UAPR, USPG, UPH, UTP, UGL, UKET, UBIL, UHGB, UNIT, UROB, ULEU, UEPI, UWBC, URBC, UBAC, CAST, CRYS, UCOM, BILUA,  in the last 72 hours  MICROBIOLOGY: Recent Results (from the past 240 hour(s))  CULTURE, BLOOD (ROUTINE X 2)     Status: None   Collection Time    09/04/13  4:15 PM      Result Value Ref Range Status   Specimen Description BLOOD RIGHT  FOREARM   Final   Special Requests BOTTLES DRAWN AEROBIC AND ANAEROBIC 5ML   Final   Culture  Setup Time     Final   Value: 09/04/2013 22:54     Performed at [facility]   Culture     Final   Value:        BLOOD CULTURE RECEIVED NO GROWTH TO DATE CULTURE WILL BE HELD FOR 5 DAYS BEFORE ISSUING A FINAL NEGATIVE REPORT     Performed at [facility]   Report Status PENDING   Incomplete  CULTURE, BLOOD (ROUTINE X 2)     Status: None   Collection Time    09/04/13  6:11 PM      Result Value Ref Range Status   Specimen Description BLOOD LEFT ARM   Final   Special Requests BOTTLES DRAWN AEROBIC AND ANAEROBIC 4ML   Final   Culture  Setup Time     Final   Value: 09/04/2013 22:55     Performed at [facility]   Culture     Final   Value:        BLOOD CULTURE RECEIVED NO GROWTH TO DATE CULTURE WILL BE HELD FOR 5 DAYS BEFORE ISSUING A FINAL NEGATIVE REPORT     Performed at [facility]   Report Status PENDING   Incomplete    RADIOLOGY STUDIES/RESULTS: Dg Chest 2 View  09/04/2013   CLINICAL DATA:  Shortness of breath.  EXAM: CHEST  2 VIEW  COMPARISON:  June 17, 2013.  FINDINGS: Stable cardiomediastinal silhouette. Status post coronary artery bypass graft. No pneumothorax or significant pleural effusion is noted. Interval development of bilateral perihilar and basilar interstitial densities are noted concerning for pulmonary edema.  IMPRESSION: Interval development of bilateral perihilar and basilar interstitial densities concerning for pulmonary edema or less likely pneumonia.   Electronically Signed   By: Sabino Dick M.D.   On: 09/04/2013 16:01   Ct Angio Chest Pe W/cm &/or Wo Cm  09/04/2013   CLINICAL DATA:  Shortness of breath, tachycardia, hypoxia. History of DVT.  EXAM: CT ANGIOGRAPHY CHEST WITH CONTRAST  TECHNIQUE: Multidetector CT imaging of the chest was performed using the standard protocol during bolus administration of intravenous contrast. Multiplanar  CT image reconstructions and MIPs were obtained to evaluate the vascular anatomy.  CONTRAST:  139m OMNIPAQUE IOHEXOL 350 MG/ML SOLN  COMPARISON:  05/12/2013  FINDINGS: THORACIC INLET/BODY WALL:  Emphysema.  MEDIASTINUM:  Large volume of pneumomediastinum, extending into the neck.  No cardiomegaly. No pericardial effusion. Extensive atherosclerosis, including the coronary arteries. Status post CABG with LIMA and upper venous graft widely patent. The graft associated with the lower ostium marker is not clearly visible, presumably chronic given history. There is occasional respiratory motion, which decreases sensitivity at the affected levels. Overall, exam is diagnostic. There is no evidence of acute pulmonary embolism. No evidence of esophageal thickening. No fluid collection. No adenopathy.  LUNG WINDOWS:  Small right apical pneumothorax, less than 5%.  There is dependent ground-glass and fine interstitial opacities. Changes are similar to CT 08/25/2013. No pleural effusion or interlobular septal thickening.  UPPER ABDOMEN:  Presumed cyst in the upper right liver, stable from previous imaging.  OSSEOUS:  No acute fracture.  No suspicious lytic or blastic lesions.  Critical Value/emergent results were called by telephone at the time of interpretation on 09/04/2013 at 5:34 PM to [facility], who verbally acknowledged these results.  Review of the MIP images confirms the above findings.  IMPRESSION: 1. Pneumomediastinum and trace right apical pneumothorax (less than 5%). This is likely from pulmonic air leak given #2. 2. Unchanged ground-glass opacities in the lower lungs, favoring an inflammatory (including drug reaction) pneumonitis or atypical infection. Given immunocompromised state (in the setting of lymphoma), consider PCP pneumonia - which can predispose to pneumothorax). 3. Negative for acute pulmonary embolism.   Electronically Signed   By: JJorje GuildM.D.   On: 09/04/2013 17:41   Nm Pet Image  Restag (ps) Skull Base To Thigh  08/25/2013   CLINICAL DATA:  Subsequent treatment strategy for lymphoma.  EXAM: NUCLEAR MEDICINE PET SKULL BASE TO THIGH  TECHNIQUE: 7.3 mCi F-18 FDG was injected intravenously. Full-ring PET imaging was performed from the skull base to thigh after the radiotracer. CT data was obtained and used for attenuation correction and anatomic localization.  FASTING BLOOD GLUCOSE:  Value: 96 mg/dl  COMPARISON:  PET-CT scan 06/21/2013  FINDINGS: NECK  No hypermetabolic lymph nodes in the neck.  CHEST  There is new hypermetabolic activity associated with ground-glass opacities primarily in the lower lobes but also scattered within the upper lobes. No mass lesion associated with the these ground-glass hypermetabolic metastases.  The hypermetabolic activity extends into the inferior pleural surfaces.  There several small nodules in the upper lobes. For example 4 mm nodule (image 64 or, series 4)in the right upper lobe. The small nodules are unchanged from prior.  ABDOMEN/PELVIS  No abnormal metabolic activity within the liver or spleen. Spleen is normal volume. Interval resolution of the hypermetabolic activity within the right inguinal lymph nodes. The lymph nodes have hypermetabolic activity less than mediastinum. No measurable enlarged lymph nodes remain.  SKELETON  No focal hypermetabolic activity to suggest skeletal metastasis.  IMPRESSION: 1. Interval resolution of hypermetabolic right inguinal lymph nodes ( [place] 1). 2. New extensive hypermetabolic ground-glass opacities predominantly in the lower lobes. FBurtis Junesthis represent to be a drug reaction or other inflammatory or infectious process. Do not favor lymphoma recurrence.  Findings conveyed tocancer center triage nurse, [place]5/11/2013 at11:17.   Electronically Signed   By: SSuzy BouchardM.D.   On: 08/25/2013 11:20   Dg Chest Port 1 View  09/05/2013   CLINICAL DATA:  Pneumothorax, short of breath, nausea  EXAM: PORTABLE CHEST -  1 VIEW  COMPARISON:  Prior chest x-ray and CT chest 09/04/2013  FINDINGS: Stable trace right apical pneumothorax. The mediastinal air is again a present and tracks into the soft tissues of the right greater than left neck. No significant interval exacerbation. Stable cardiac and mediastinal contours. Patient is status post median sternotomy with evidence of prior multivessel CABG including LIMA bypass. Similar to slightly increased left greater than right bibasilar patchy airspace opacities. No pleural effusion. No acute osseous abnormality.  IMPRESSION: 1. Stable pneumomediastinum in trace right apical pneumothorax. 2. Similar to slightly increased left greater than right bibasilar opacities. Differential considerations remain unchanged including pneumonitis and atypical infection.   Electronically Signed   By: Jacqulynn Cadet M.D.   On: 09/05/2013 08:18    Coreyon Nicotra Kristeen Mans, MD  [facility] Pager:[phone]  If 7PM-7AM, please contact night-coverage [email or web address] Password [email or web address] 09/05/2013, 9:47 AM   LOS: 1 day   **Disclaimer: This note may have been dictated with voice recognition software. Similar sounding words can inadvertently be transcribed and this note may contain transcription errors which may not have been corrected upon publication of note.**

## 2013-09-06 ENCOUNTER — Inpatient Hospital Stay (HOSPITAL_COMMUNITY): Payer: Medicare Other

## 2013-09-06 DIAGNOSIS — I059 Rheumatic mitral valve disease, unspecified: Secondary | ICD-10-CM

## 2013-09-06 LAB — GLUCOSE, CAPILLARY: Glucose-Capillary: 154 mg/dL — ABNORMAL HIGH (ref 70–99)

## 2013-09-06 LAB — PROCALCITONIN: Procalcitonin: 0.12 ng/mL

## 2013-09-06 MED ORDER — DEXTROSE 5 % IV SOLN
1.0000 g | Freq: Two times a day (BID) | INTRAVENOUS | Status: DC
  Administered 2013-09-06: 1 g via INTRAVENOUS
  Filled 2013-09-06 (×3): qty 1

## 2013-09-06 MED ORDER — POTASSIUM CHLORIDE CRYS ER 20 MEQ PO TBCR
40.0000 meq | EXTENDED_RELEASE_TABLET | Freq: Once | ORAL | Status: AC
  Administered 2013-09-06: 40 meq via ORAL
  Filled 2013-09-06: qty 2

## 2013-09-06 MED ORDER — METHYLPREDNISOLONE SODIUM SUCC 40 MG IJ SOLR
40.0000 mg | Freq: Two times a day (BID) | INTRAMUSCULAR | Status: DC
  Administered 2013-09-06: 40 mg via INTRAVENOUS
  Filled 2013-09-06 (×3): qty 1

## 2013-09-06 MED ORDER — DOCUSATE SODIUM 100 MG PO CAPS
100.0000 mg | ORAL_CAPSULE | Freq: Two times a day (BID) | ORAL | Status: DC
  Administered 2013-09-06 – 2013-09-12 (×10): 100 mg via ORAL
  Filled 2013-09-06 (×14): qty 1

## 2013-09-06 NOTE — Progress Notes (Signed)
Echo Lab  2D Echocardiogram completed.  [place], RDCS 09/06/2013 1:26 PM

## 2013-09-06 NOTE — Progress Notes (Signed)
PATIENT DETAILS Name: Willie [name] Age: 73 y.o. Sex: male Date of Birth: 07-03-1940 Admit Date: 09/04/2013 Admitting Physician Delfina Redwood, MD [id number] DAVIDSON, MD  Subjective: Admitted with exertional dyspnea and fever.  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  -already on prednisone as outpatient-has been continued, on empiric antibiotics to cover HCAP (Vanco/Cefepime started on 5/18) and possible Pneumocystis PNA -as immunocompromised and pneumothorax (On bactrim started on 5/18) -check sputum PCP smear, check HIV -have asked PCCM to consult  Hyponatremia -repeat BMET today, clinically euvolemic-?SIADH from lung opacties-check Serum/Urine osm, Urine Na. Monitor and follow.  Severe Mitral Regurgitation -check Echo-but suspect Shortness of breath secondary to above issues.  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 He had an initial complete response to 6 cycles of CHOP chemotherapy. He relapsed October 2009 with extensive infiltration of tumor into the right iliopsoas muscle presenting as an erythematous nodular cutaneous mass in the right inguinal region. No other disease outside of the muscle noted on PET scan. He again achieved remission with ICE chemotherapy and then went  onto receive BCNU, etoposide and Cytoxan conditioning followed by autologous stem cell transplant at [facility] in January 2010. He was recently found to have a recurrence in the soft tissues of the right inguinal region. He began brentuximab on 06/28/2013."If above symptoms deemed to be drug related -may need to change regimen.  Hx of CAD-s/p CABG in 2007, and PCI in June 2014 -c/w ASA/Effient/Metoprolol and statins -SOB likely secondary to above, no chest pain.  Disposition: Remain inpatient  DVT Prophylaxis: Prophylactic Lovenox  Code Status: Full code   Family Communication Spouse at bedside  Procedures:  None  CONSULTS:  pulmonary/intensive care and CTVS  Time spent 35 min    MEDICATIONS: Scheduled Meds: . aspirin EC  81 mg Oral QPM  . atorvastatin  20 mg Oral Once per day on Sun Wed  . ceFEPime (MAXIPIME) IV  1 g Intravenous Q12H  . cholecalciferol  1,000 Units Oral Daily  . vitamin B-12  500 mcg Oral Daily  . docusate sodium  100 mg Oral BID  . enoxaparin (LOVENOX) injection  40 mg Subcutaneous Q24H  . methylPREDNISolone (SOLU-MEDROL) injection  40 mg Intravenous BID  . metoprolol tartrate  25 mg Oral q morning - 10a  . multivitamin with minerals  1 tablet Oral Daily  . prasugrel  10 mg Oral QPM  . sodium chloride  3 mL Intravenous Q12H  . sulfamethoxazole-trimethoprim  15 mg/kg/day Intravenous 3 times per day  . valACYclovir  500 mg Oral Daily  . vancomycin  500 mg Intravenous Q12H   Continuous Infusions:   PRN Meds:.sodium chloride, acetaminophen, acetaminophen, albuterol, alum & mag hydroxide-simeth, HYDROcodone-acetaminophen, nitroGLYCERIN, ondansetron (ZOFRAN) IV, ondansetron, senna-docusate, sodium chloride  Antibiotics: Anti-infectives   Start     Dose/Rate Route Frequency Ordered Stop   09/06/13 2200  ceFEPIme (MAXIPIME) 1 g in dextrose 5 % 50 mL IVPB  1 g 100 mL/hr over 30 Minutes Intravenous Every 12 hours 09/06/13 1337     09/05/13 1000   valACYclovir (VALTREX) tablet 500 mg     500 mg Oral Daily 09/04/13 2012     09/05/13 0800  vancomycin (VANCOCIN) 500 mg in sodium chloride 0.9 % 100 mL IVPB     500 mg 100 mL/hr over 60 Minutes Intravenous Every 12 hours 09/04/13 2032     09/05/13 0200  ceFEPIme (MAXIPIME) 1 g in dextrose 5 % 50 mL IVPB  Status:  Discontinued     1 g 100 mL/hr over 30 Minutes Intravenous Every 8 hours 09/04/13 2012 09/06/13 1337   09/04/13 2100  sulfamethoxazole-trimethoprim (BACTRIM) 302.4 mg in dextrose 5 % 250 mL IVPB     15 mg/kg/day  60.5 kg 268.9 mL/hr over 60 Minutes Intravenous 3 times per day 09/04/13 2001     09/04/13 1800  vancomycin (VANCOCIN) IVPB 1000 mg/200 mL premix     1,000 mg 200 mL/hr over 60 Minutes Intravenous  Once 09/04/13 1748 09/04/13 1952   09/04/13 1800  piperacillin-tazobactam (ZOSYN) IVPB 3.375 g     3.375 g 100 mL/hr over 30 Minutes Intravenous  Once 09/04/13 1748 09/04/13 1849       PHYSICAL EXAM: Vital signs in last 24 hours: Filed Vitals:   09/05/13 1439 09/05/13 1948 09/06/13 0406 09/06/13 0656  BP: 121/69 109/64 129/62   Pulse: 97 87 100   Temp: 98.3 F (36.8 C) 97.6 F (36.4 C) 97.8 F (36.6 C)   TempSrc: Oral Oral Oral   Resp: $Remo'20 20 18   'yxuxj$ Height:      Weight:    57.97 kg (127 lb 12.8 oz)  SpO2: 96% 94% 97%     Weight change: -2.53 kg (-5 lb 9.3 oz) Filed Weights   09/04/13 1948 09/04/13 2039 09/06/13 0656  Weight: 60.5 kg (133 lb 6.1 oz) 60.328 kg (133 lb) 57.97 kg (127 lb 12.8 oz)   Body mass index is 18.86 kg/(m^2).   Gen Exam: Awake and alert with clear speech.   Neck: Supple, No JVD.   Chest: B/L basal rales CVS: S1 S2 Regular, no murmurs.  Abdomen: soft, BS +, non tender, non distended.  Extremities: no edema, lower extremities warm to touch. Neurologic: Non Focal.   Skin: No Rash.   Wounds: N/A.   Intake/Output from previous day:  Intake/Output Summary (Last 24 hours) at 09/06/13 1606 Last data filed at 09/06/13 0900  Gross per 24  hour  Intake 2599.5 ml  Output    250 ml  Net 2349.5 ml     LAB RESULTS: CBC  Recent Labs Lab 09/04/13 1610 09/05/13 1130  WBC 14.5* 18.4*  HGB 11.1* 10.9*  HCT 31.3* 30.2*  PLT 259 254  MCV 97.2 98.1  MCH 34.5* 35.4*  MCHC 35.5 36.1*  RDW 18.5* 18.5*  LYMPHSABS 0.4*  --   MONOABS 0.2  --   EOSABS 0.0  --   BASOSABS 0.0  --     Chemistries   Recent Labs Lab 09/04/13 1610 09/05/13 1130  NA 124* 128*  K 3.8 3.2*  CL 85* 87*  CO2 27 29  GLUCOSE 119* 101*  BUN 30* 24*  CREATININE 1.21 1.39*  CALCIUM 9.3 8.9    CBG:  Recent Labs Lab 09/05/13 1627 09/06/13 0638  GLUCAP 175* 154*    GFR Estimated Creatinine Clearance: 40 ml/min (by C-G formula based on Cr of 1.39).  Coagulation profile No results found  for this basename: INR, PROTIME,  in the last 168 hours  Cardiac Enzymes No results found for this basename: CK, CKMB, TROPONINI, MYOGLOBIN,  in the last 168 hours  No components found with this basename: POCBNP,  No results found for this basename: DDIMER,  in the last 72 hours No results found for this basename: HGBA1C,  in the last 72 hours No results found for this basename: CHOL, HDL, LDLCALC, TRIG, CHOLHDL, LDLDIRECT,  in the last 72 hours  Recent Labs  09/05/13 1130  TSH 1.860   No results found for this basename: VITAMINB12, FOLATE, FERRITIN, TIBC, IRON, RETICCTPCT,  in the last 72 hours No results found for this basename: LIPASE, AMYLASE,  in the last 72 hours  Urine Studies No results found for this basename: UACOL, UAPR, USPG, UPH, UTP, UGL, UKET, UBIL, UHGB, UNIT, UROB, ULEU, UEPI, UWBC, URBC, UBAC, CAST, CRYS, UCOM, BILUA,  in the last 72 hours  MICROBIOLOGY: Recent Results (from the past 240 hour(s))  CULTURE, BLOOD (ROUTINE X 2)     Status: None   Collection Time    09/04/13  4:15 PM      Result Value Ref Range Status   Specimen Description BLOOD RIGHT FOREARM   Final   Special Requests BOTTLES DRAWN AEROBIC AND ANAEROBIC 5ML    Final   Culture  Setup Time     Final   Value: 09/04/2013 22:54     Performed at [facility]   Culture     Final   Value:        BLOOD CULTURE RECEIVED NO GROWTH TO DATE CULTURE WILL BE HELD FOR 5 DAYS BEFORE ISSUING A FINAL NEGATIVE REPORT     Performed at [facility]   Report Status PENDING   Incomplete  CULTURE, BLOOD (ROUTINE X 2)     Status: None   Collection Time    09/04/13  6:11 PM      Result Value Ref Range Status   Specimen Description BLOOD LEFT ARM   Final   Special Requests BOTTLES DRAWN AEROBIC AND ANAEROBIC 4ML   Final   Culture  Setup Time     Final   Value: 09/04/2013 22:55     Performed at [facility]   Culture     Final   Value:        BLOOD CULTURE RECEIVED NO GROWTH TO DATE CULTURE WILL BE HELD FOR 5 DAYS BEFORE ISSUING A FINAL NEGATIVE REPORT     Performed at [facility]   Report Status PENDING   Incomplete    RADIOLOGY STUDIES/RESULTS: Dg Chest 2 View  09/04/2013   CLINICAL DATA:  Shortness of breath.  EXAM: CHEST  2 VIEW  COMPARISON:  June 17, 2013.  FINDINGS: Stable cardiomediastinal silhouette. Status post coronary artery bypass graft. No pneumothorax or significant pleural effusion is noted. Interval development of bilateral perihilar and basilar interstitial densities are noted concerning for pulmonary edema.  IMPRESSION: Interval development of bilateral perihilar and basilar interstitial densities concerning for pulmonary edema or less likely pneumonia.   Electronically Signed   By: Sabino Dick M.D.   On: 09/04/2013 16:01   Ct Angio Chest Pe W/cm &/or Wo Cm  09/04/2013   CLINICAL DATA:  Shortness of breath, tachycardia, hypoxia. History of DVT.  EXAM: CT ANGIOGRAPHY CHEST WITH CONTRAST  TECHNIQUE: Multidetector CT imaging of the chest was performed using the standard protocol during bolus administration of intravenous contrast. Multiplanar CT image reconstructions and  MIPs were obtained to evaluate the vascular  anatomy.  CONTRAST:  155mL OMNIPAQUE IOHEXOL 350 MG/ML SOLN  COMPARISON:  05/12/2013  FINDINGS: THORACIC INLET/BODY WALL:  Emphysema.  MEDIASTINUM:  Large volume of pneumomediastinum, extending into the neck.  No cardiomegaly. No pericardial effusion. Extensive atherosclerosis, including the coronary arteries. Status post CABG with LIMA and upper venous graft widely patent. The graft associated with the lower ostium marker is not clearly visible, presumably chronic given history. There is occasional respiratory motion, which decreases sensitivity at the affected levels. Overall, exam is diagnostic. There is no evidence of acute pulmonary embolism. No evidence of esophageal thickening. No fluid collection. No adenopathy.  LUNG WINDOWS:  Small right apical pneumothorax, less than 5%.  There is dependent ground-glass and fine interstitial opacities. Changes are similar to CT 08/25/2013. No pleural effusion or interlobular septal thickening.  UPPER ABDOMEN:  Presumed cyst in the upper right liver, stable from previous imaging.  OSSEOUS:  No acute fracture.  No suspicious lytic or blastic lesions.  Critical Value/emergent results were called by telephone at the time of interpretation on 09/04/2013 at 5:34 PM to Dr. Jinny Blossom, [facility] , who verbally acknowledged these results.  Review of the MIP images confirms the above findings.  IMPRESSION: 1. Pneumomediastinum and trace right apical pneumothorax (less than 5%). This is likely from pulmonic air leak given #2. 2. Unchanged ground-glass opacities in the lower lungs, favoring an inflammatory (including drug reaction) pneumonitis or atypical infection. Given immunocompromised state (in the setting of lymphoma), consider PCP pneumonia - which can predispose to pneumothorax). 3. Negative for acute pulmonary embolism.   Electronically Signed   By: Jorje Guild M.D.   On: 09/04/2013 17:41   Nm Pet Image Restag (ps) Skull Base To Thigh  08/25/2013   CLINICAL DATA:  Subsequent  treatment strategy for lymphoma.  EXAM: NUCLEAR MEDICINE PET SKULL BASE TO THIGH  TECHNIQUE: 7.3 mCi F-18 FDG was injected intravenously. Full-ring PET imaging was performed from the skull base to thigh after the radiotracer. CT data was obtained and used for attenuation correction and anatomic localization.  FASTING BLOOD GLUCOSE:  Value: 96 mg/dl  COMPARISON:  PET-CT scan 06/21/2013  FINDINGS: NECK  No hypermetabolic lymph nodes in the neck.  CHEST  There is new hypermetabolic activity associated with ground-glass opacities primarily in the lower lobes but also scattered within the upper lobes. No mass lesion associated with the these ground-glass hypermetabolic metastases.  The hypermetabolic activity extends into the inferior pleural surfaces.  There several small nodules in the upper lobes. For example 4 mm nodule (image 64 or, series 4)in the right upper lobe. The small nodules are unchanged from prior.  ABDOMEN/PELVIS  No abnormal metabolic activity within the liver or spleen. Spleen is normal volume. Interval resolution of the hypermetabolic activity within the right inguinal lymph nodes. The lymph nodes have hypermetabolic activity less than mediastinum. No measurable enlarged lymph nodes remain.  SKELETON  No focal hypermetabolic activity to suggest skeletal metastasis.  IMPRESSION: 1. Interval resolution of hypermetabolic right inguinal lymph nodes ( [place] 1). 2. New extensive hypermetabolic ground-glass opacities predominantly in the lower lobes. Burtis Junes this represent to be a drug reaction or other inflammatory or infectious process. Do not favor lymphoma recurrence.  Findings conveyed tocancer center triage nurse, [place] 08/25/2013 at11:17.   Electronically Signed   By: Suzy Bouchard M.D.   On: 08/25/2013 11:20   Dg Chest Port 1 View  09/05/2013   CLINICAL DATA:  Pneumothorax, short of breath, nausea  EXAM: PORTABLE CHEST - 1 VIEW  COMPARISON:  Prior chest x-ray and CT chest 09/04/2013   FINDINGS: Stable trace right apical pneumothorax. The mediastinal air is again a present and tracks into the soft tissues of the right greater than left neck. No significant interval exacerbation. Stable cardiac and mediastinal contours. Patient is status post median sternotomy with evidence of prior multivessel CABG including LIMA bypass. Similar to slightly increased left greater than right bibasilar patchy airspace opacities. No pleural effusion. No acute osseous abnormality.  IMPRESSION: 1. Stable pneumomediastinum in trace right apical pneumothorax. 2. Similar to slightly increased left greater than right bibasilar opacities. Differential considerations remain unchanged including pneumonitis and atypical infection.   Electronically Signed   By: Jacqulynn Cadet M.D.   On: 09/05/2013 08:18    Kelvin Cellar, MD  [facility] Pager:[phone]  If 7PM-7AM, please contact night-coverage [email or web address] Password [email or web address] 09/06/2013, 4:06 PM   LOS: 2 days   **Disclaimer: This note may have been dictated with voice recognition software. Similar sounding words can inadvertently be transcribed and this note may contain transcription errors which may not have been corrected upon publication of note.**

## 2013-09-06 NOTE — Progress Notes (Signed)
  Subjective:  Results of Gastrografin swallow noted-no evidence of leak to explain pneumomediastinum. Followup chest x-ray shows no increase in small apical right pneumothorax. The pneumo mediastinum probably resulted from pulmonary disease with dissection of air into the mediastinum. No surgical therapy needed. Echocardiogram shows no pericardial effusion, good LV function with moderate MR  Objective: Vital signs in last 24 hours: Temp:  [97.6 F (36.4 C)-97.8 F (36.6 C)] 97.8 F (36.6 C) (05/20 0406) Pulse Rate:  [87-100] 100 (05/20 0406) Cardiac Rhythm:  [-]  Resp:  [18-20] 18 (05/20 0406) BP: (109-129)/(62-64) 129/62 mmHg (05/20 0406) SpO2:  [94 %-97 %] 97 % (05/20 0406) Weight:  [127 lb 12.8 oz (57.97 kg)] 127 lb 12.8 oz (57.97 kg) (05/20 0656)  Hemodynamic parameters for last 24 hours:   stable afebrile  Intake/Output from previous day: 05/19 0701 - 05/20 0700 In: 2489.5 [P.O.:120; I.V.:625; IV Piggyback:1744.5] Out: 500 [Urine:500] Intake/Output this shift: Total I/O In: 110 [P.O.:110] Out: -     Lab Results:  Recent Labs  09/04/13 1610 09/05/13 1130  WBC 14.5* 18.4*  HGB 11.1* 10.9*  HCT 31.3* 30.2*  PLT 259 254   BMET:  Recent Labs  09/04/13 1610 09/05/13 1130  NA 124* 128*  K 3.8 3.2*  CL 85* 87*  CO2 27 29  GLUCOSE 119* 101*  BUN 30* 24*  CREATININE 1.21 1.39*  CALCIUM 9.3 8.9    PT/INR: No results found for this basename: LABPROT, INR,  in the last 72 hours ABG    Component Value Date/Time   PHART 7.527* 09/05/2013 0730   HCO3 30.8* 09/05/2013 0730   TCO2 31.9 09/05/2013 0730   O2SAT 100.0 09/05/2013 0730   CBG (last 3)   Recent Labs  09/05/13 1627 09/06/13 0638  GLUCAP 175* 154*    Assessment/Plan: S/P   Continue antibiotics and medical therapy for bilateral pneumonia-pneumonitis. We'll be available to see the patient if lung biopsy needed to rule out pneumocystis  LOS: 2 days    Tharon Aquas [facility] 09/06/2013

## 2013-09-06 NOTE — Progress Notes (Signed)
Name: DELOSS AMICO MRN: [id number] DOB: 1941-02-21    ADMISSION DATE:  09/04/2013 CONSULTATION DATE:  09/04/2013  REFERRING MD :  Sloan Leiter PRIMARY SERVICE:  Internal  CHIEF COMPLAINT:  PTX, Pneumomediastinum, GGO's.  BRIEF PATIENT DESCRIPTION: 73 y.o. w PMH of NHL, currently on chemo, presented 5/18 with severe SOB.  In ED, found to have small 5% right PTX, pneumomediastinum, dependent GGO's.  PCCM was consulted.  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 Oct 2009 - relapse Jan 2010 - stem cell transplant March 2015 - began brentuximab 09/04/13 - admitted with severe SOB, found to have pneumomediastinum, PTX, and GGO's (unchanged from CT on 08/25/13) 5/19 ba swallow-no leak  LINES / TUBES: PIV  CULTURES: Blood 5/18 >>> ng PCP smear 5/18 >>>  ANTIBIOTICS: Cefepime 5/18 >>> Bactrim 5/18 >>> Vanc 5/18 >>> Zosyn 5/18 x 1 Valtrex 5/19 >>>   HISTORY OF PRESENT ILLNESS:  Mr. Milley is a 73 y.o. With PMH including but not limited to ALk pos NHL (had completed CHOP chemo x 6 rounds, stem cell transplant, BCNU, etoposide, cytoxan, began brentuximab 06/28/2013 for inguinal recurrence). PEt scan showed new GGO s/o infection, started on oral abx & prednisone by onc Alvy Bimler) He was in his [place] until 5/18 when he developed severe SOB mainly with exertion prompting him to go to the ED.  He has had mild dry cough as well as some lower extremity edema.  No fever/chills/sweats, chest pain, N/V/D.  In ED, CTA was negative for PE; however, did reveal large volume pneumomediastinum extending into the neck, a small right apical PTX (< 5%), and dependent GGO's and fine interstitial opacities that were similar to PET scan done on 08/25/13.  Findings were concerning for atypical PNA vs PCP given his immunocompromised state.  PCCM was consulted for further recs.  SUBJECTIVE: Breathing improved  oob to  chair afebrile  No chest pain, wheeze, cough, fevers/chills/sweats.  VITAL SIGNS: Temp:  [97.6 F (36.4 C)-98.3 F (36.8 C)] 97.8 F (36.6 C) (05/20 0406) Pulse Rate:  [87-100] 100 (05/20 0406) Resp:  [18-20] 18 (05/20 0406) BP: (109-129)/(62-69) 129/62 mmHg (05/20 0406) SpO2:  [94 %-97 %] 97 % (05/20 0406) Weight:  [57.97 kg (127 lb 12.8 oz)] 57.97 kg (127 lb 12.8 oz) (05/20 0656)  PHYSICAL EXAMINATION: General: Pleasant male,  in NAD. Neuro: A&O x 3, non-focal.  HEENT: Smock/AT. PERRL, sclerae anicteric. Cardiovascular: RRR, 3/6 PSM.at apex Lungs: Respirations even and unlabored.  CTA bilaterally, No W/R/R.  [place] in place. Abdomen: BS x 4, soft, NT/ND.  Musculoskeletal: No gross deformities, no edema.  Skin: Intact, warm, no rashes.     Recent Labs Lab 08/30/13 1240 09/04/13 1610 09/05/13 1130  NA 135* 124* 128*  K 3.4* 3.8 3.2*  CL  --  85* 87*  CO2 $Re'26 27 29  'jTD$ BUN 27.2* 30* 24*  CREATININE 1.4* 1.21 1.39*  GLUCOSE 102 119* 101*    Recent Labs Lab 08/30/13 1240 09/04/13 1610 09/05/13 1130  HGB 11.1* 11.1* 10.9*  HCT 32.0* 31.3* 30.2*  WBC 19.0 Repeated and Verified* 14.5* 18.4*  PLT 313 259 254   Dg Chest 2 View  09/06/2013   CLINICAL DATA:  Right-sided pneumothorax  EXAM: CHEST  2 VIEW  COMPARISON:  Portable chest of Sep 05, 2013. And barium esophagram of the same day  FINDINGS: No pneumothorax visible. Small amounts of mediastinal air the in right perihilar  region and in bilateral upper paratracheal region similar to prior. No pleural effusion. Minimal prominence of pulmonary interstitium. Cardiac silhouette and pulmonary vascularity within limits of normal.  IMPRESSION: 1. No visible right pneumothorax.  Pneumomediastinum persists. 2. Mild stable pulmonary interstitial edema.   Electronically Signed   By: David  Martinique   On: 09/06/2013 09:41   Dg Chest 2 View  09/04/2013   CLINICAL DATA:  Shortness of breath.  EXAM: CHEST  2 VIEW  COMPARISON:  June 17, 2013.   FINDINGS: Stable cardiomediastinal silhouette. Status post coronary artery bypass graft. No pneumothorax or significant pleural effusion is noted. Interval development of bilateral perihilar and basilar interstitial densities are noted concerning for pulmonary edema.  IMPRESSION: Interval development of bilateral perihilar and basilar interstitial densities concerning for pulmonary edema or less likely pneumonia.   Electronically Signed   By: Sabino Dick M.D.   On: 09/04/2013 16:01   Ct Angio Chest Pe W/cm &/or Wo Cm  09/04/2013   CLINICAL DATA:  Shortness of breath, tachycardia, hypoxia. History of DVT.  EXAM: CT ANGIOGRAPHY CHEST WITH CONTRAST  TECHNIQUE: Multidetector CT imaging of the chest was performed using the standard protocol during bolus administration of intravenous contrast. Multiplanar CT image reconstructions and MIPs were obtained to evaluate the vascular anatomy.  CONTRAST:  147mL OMNIPAQUE IOHEXOL 350 MG/ML SOLN  COMPARISON:  05/12/2013  FINDINGS: THORACIC INLET/BODY WALL:  Emphysema.  MEDIASTINUM:  Large volume of pneumomediastinum, extending into the neck.  No cardiomegaly. No pericardial effusion. Extensive atherosclerosis, including the coronary arteries. Status post CABG with LIMA and upper venous graft widely patent. The graft associated with the lower ostium marker is not clearly visible, presumably chronic given history. There is occasional respiratory motion, which decreases sensitivity at the affected levels. Overall, exam is diagnostic. There is no evidence of acute pulmonary embolism. No evidence of esophageal thickening. No fluid collection. No adenopathy.  LUNG WINDOWS:  Small right apical pneumothorax, less than 5%.  There is dependent ground-glass and fine interstitial opacities. Changes are similar to CT 08/25/2013. No pleural effusion or interlobular septal thickening.  UPPER ABDOMEN:  Presumed cyst in the upper right liver, stable from previous imaging.  OSSEOUS:  No acute  fracture.  No suspicious lytic or blastic lesions.  Critical Value/emergent results were called by telephone at the time of interpretation on 09/04/2013 at 5:34 PM to Dr. Jinny Blossom, [facility] , who verbally acknowledged these results.  Review of the MIP images confirms the above findings.  IMPRESSION: 1. Pneumomediastinum and trace right apical pneumothorax (less than 5%). This is likely from pulmonic air leak given #2. 2. Unchanged ground-glass opacities in the lower lungs, favoring an inflammatory (including drug reaction) pneumonitis or atypical infection. Given immunocompromised state (in the setting of lymphoma), consider PCP pneumonia - which can predispose to pneumothorax). 3. Negative for acute pulmonary embolism.   Electronically Signed   By: Jorje Guild M.D.   On: 09/04/2013 17:41   Dg Chest Port 1 View  09/05/2013   CLINICAL DATA:  Pneumothorax, short of breath, nausea  EXAM: PORTABLE CHEST - 1 VIEW  COMPARISON:  Prior chest x-ray and CT chest 09/04/2013  FINDINGS: Stable trace right apical pneumothorax. The mediastinal air is again a present and tracks into the soft tissues of the right greater than left neck. No significant interval exacerbation. Stable cardiac and mediastinal contours. Patient is status post median sternotomy with evidence of prior multivessel CABG including LIMA bypass. Similar to slightly increased left greater than right bibasilar patchy  airspace opacities. No pleural effusion. No acute osseous abnormality.  IMPRESSION: 1. Stable pneumomediastinum in trace right apical pneumothorax. 2. Similar to slightly increased left greater than right bibasilar opacities. Differential considerations remain unchanged including pneumonitis and atypical infection.   Electronically Signed   By: Jacqulynn Cadet M.D.   On: 09/05/2013 08:18   Dg Esophagus W/water Sol Cm  09/05/2013   CLINICAL DATA:  Evaluate for esophageal rupture  EXAM: ESOPHOGRAM/BARIUM SWALLOW  TECHNIQUE: Single contrast  examination was performed using water-soluble contrast.  FLUOROSCOPY TIME:  55 seconds  50 cc Omnipaque 300  COMPARISON:  09/05/2013  FINDINGS: The esophagus is patent. No stricture or mass identified. No abnormal extraluminal extravasation of contrast identified to suggest esophageal leak/ rupture.  IMPRESSION: Negative exam.   Electronically Signed   By: Kerby Moors M.D.   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 Lit review does show a 1-10% incidence of pneumonitis with brentuximab (anti CD 30 agent) Small right apical PTX -resolved ,Pneumomediastinum -cause of this is unclear RECS: - Continue empiric abx for now, narrow in 24h  -ct  solumedrol 40 q 12h with CBG monitoring - F/u PCP smear - can dc bactrim once neg, doubt PCP - Supplemental O2 as needed, wean as able. - May need changes made to chemo regimen, defer to Oncology. -unfortunately with apical pneumothx, cannot perform bronchoscopy , hence manage conservatively   Mod MR - followed by Dr Gwenlyn Found Agree with diuresis per BNP Will follow, duration of steroids unclear  Kara Mead MD. FCCP. [facility] Pager [phone] If no response call [phone]

## 2013-09-07 ENCOUNTER — Inpatient Hospital Stay (HOSPITAL_COMMUNITY): Payer: Medicare Other

## 2013-09-07 DIAGNOSIS — I059 Rheumatic mitral valve disease, unspecified: Secondary | ICD-10-CM

## 2013-09-07 DIAGNOSIS — I25810 Atherosclerosis of coronary artery bypass graft(s) without angina pectoris: Secondary | ICD-10-CM

## 2013-09-07 DIAGNOSIS — Z87898 Personal history of other specified conditions: Secondary | ICD-10-CM

## 2013-09-07 DIAGNOSIS — R5381 Other malaise: Secondary | ICD-10-CM

## 2013-09-07 LAB — BASIC METABOLIC PANEL
BUN: 25 mg/dL — ABNORMAL HIGH (ref 6–23)
CHLORIDE: 88 meq/L — AB (ref 96–112)
CO2: 22 mEq/L (ref 19–32)
Calcium: 8.6 mg/dL (ref 8.4–10.5)
Creatinine, Ser: 1.51 mg/dL — ABNORMAL HIGH (ref 0.50–1.35)
GFR, EST AFRICAN AMERICAN: 52 mL/min — AB (ref >90)
GFR, EST NON AFRICAN AMERICAN: 45 mL/min — AB (ref >90)
Glucose, Bld: 178 mg/dL — ABNORMAL HIGH (ref 70–99)
POTASSIUM: 4.4 meq/L (ref 3.7–5.3)
Sodium: 126 mEq/L — ABNORMAL LOW (ref 137–147)

## 2013-09-07 LAB — CBC
HCT: 31 % — ABNORMAL LOW (ref 39.0–52.0)
HEMOGLOBIN: 10.8 g/dL — AB (ref 13.0–17.0)
MCH: 34.8 pg — ABNORMAL HIGH (ref 26.0–34.0)
MCHC: 34.8 g/dL (ref 30.0–36.0)
MCV: 100 fL (ref 78.0–100.0)
Platelets: 243 10*3/uL (ref 150–400)
RBC: 3.1 MIL/uL — ABNORMAL LOW (ref 4.22–5.81)
RDW: 18.5 % — AB (ref 11.5–15.5)
WBC: 16.8 10*3/uL — ABNORMAL HIGH (ref 4.0–10.5)

## 2013-09-07 LAB — GLUCOSE, CAPILLARY: GLUCOSE-CAPILLARY: 138 mg/dL — AB (ref 70–99)

## 2013-09-07 MED ORDER — PREDNISONE 50 MG PO TABS
60.0000 mg | ORAL_TABLET | Freq: Every day | ORAL | Status: DC
  Administered 2013-09-08 – 2013-09-12 (×5): 60 mg via ORAL
  Filled 2013-09-07 (×6): qty 1

## 2013-09-07 MED ORDER — SULFAMETHOXAZOLE-TMP DS 800-160 MG PO TABS
1.0000 | ORAL_TABLET | Freq: Two times a day (BID) | ORAL | Status: DC
  Administered 2013-09-07: 1 via ORAL
  Filled 2013-09-07 (×2): qty 1

## 2013-09-07 NOTE — Progress Notes (Signed)
PATIENT DETAILS Name: Willie [name] Age: 73 y.o. Sex: male Date of Birth: 1941-03-30 Admit Date: 09/04/2013 Admitting Physician Delfina Redwood, MD TMH:[id number] DAVIDSON, MD  Subjective: Admitted with exertional dyspnea and fever.  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  -Case discussed with Dr Elsworth Soho, did not feel this reflected PCP, recommended stopping Vancomycin and bactrim   Hyponatremia -Sodium of 126-?SIADH from lung opacties- Monitor and follow.  Severe Mitral Regurgitation -2-D echo showing moderate, holosystolicprolapse, involving the anterior leaflet. 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 He had an initial complete response to 6 cycles of CHOP chemotherapy. He relapsed October 2009 with extensive infiltration of tumor into the right iliopsoas muscle presenting as an erythematous nodular cutaneous mass in the right inguinal region. No other disease outside of the muscle noted on PET scan. He again achieved remission with ICE chemotherapy and then went onto receive BCNU, etoposide and Cytoxan conditioning followed by autologous stem cell transplant at [facility]  in January 2010. He was recently found to have a recurrence in the soft tissues of the right inguinal region. He began brentuximab on 06/28/2013."If above symptoms deemed to be drug related -may need to change regimen.  Hx of CAD-s/p CABG in 2007, and PCI in June 2014 -c/w ASA/Effient/Metoprolol and statins -SOB likely secondary to above, no chest pain.  Disposition: Remain inpatient  DVT Prophylaxis: Prophylactic Lovenox  Code Status: Full code   Family Communication Spouse at bedside  Procedures:  None  CONSULTS:  pulmonary/intensive care and CTVS  Time spent 35 min    MEDICATIONS: Scheduled Meds: . aspirin EC  81 mg Oral QPM  . atorvastatin  20 mg Oral Once per day on Sun Wed  . cholecalciferol  1,000 Units Oral Daily  . vitamin B-12  500 mcg Oral Daily  . docusate sodium  100 mg Oral BID  . enoxaparin (LOVENOX) injection  40 mg Subcutaneous Q24H  . metoprolol tartrate  25 mg Oral q morning - 10a  . multivitamin with minerals  1 tablet Oral Daily  . prasugrel  10 mg Oral QPM  . [START ON 09/08/2013] predniSONE  60 mg Oral Q breakfast  . sodium chloride  3 mL Intravenous Q12H  . valACYclovir  500 mg Oral Daily   Continuous Infusions:   PRN Meds:.sodium chloride, acetaminophen, acetaminophen, albuterol, alum & mag hydroxide-simeth, HYDROcodone-acetaminophen, nitroGLYCERIN, ondansetron (ZOFRAN) IV, ondansetron, senna-docusate, sodium chloride  Antibiotics: Anti-infectives   Start     Dose/Rate Route Frequency Ordered Stop   09/07/13 1000  sulfamethoxazole-trimethoprim (BACTRIM DS) 800-160 MG per tablet 1 tablet  Status:  Discontinued     1 tablet Oral Every 12 hours 09/07/13 0908 09/07/13 1422   09/06/13 2200  ceFEPIme (MAXIPIME) 1 g in dextrose 5 % 50 mL IVPB  Status:  Discontinued     1 g  100 mL/hr over 30 Minutes Intravenous Every 12 hours 09/06/13 1337 09/07/13 0908   09/05/13 1000  valACYclovir (VALTREX) tablet 500 mg     500 mg Oral Daily 09/04/13 2012      09/05/13 0800  vancomycin (VANCOCIN) 500 mg in sodium chloride 0.9 % 100 mL IVPB  Status:  Discontinued     500 mg 100 mL/hr over 60 Minutes Intravenous Every 12 hours 09/04/13 2032 09/07/13 0908   09/05/13 0200  ceFEPIme (MAXIPIME) 1 g in dextrose 5 % 50 mL IVPB  Status:  Discontinued     1 g 100 mL/hr over 30 Minutes Intravenous Every 8 hours 09/04/13 2012 09/06/13 1337   09/04/13 2100  sulfamethoxazole-trimethoprim (BACTRIM) 302.4 mg in dextrose 5 % 250 mL IVPB  Status:  Discontinued     15 mg/kg/day  60.5 kg 268.9 mL/hr over 60 Minutes Intravenous 3 times per day 09/04/13 2001 09/07/13 0908   09/04/13 1800  vancomycin (VANCOCIN) IVPB 1000 mg/200 mL premix     1,000 mg 200 mL/hr over 60 Minutes Intravenous  Once 09/04/13 1748 09/04/13 1952   09/04/13 1800  piperacillin-tazobactam (ZOSYN) IVPB 3.375 g     3.375 g 100 mL/hr over 30 Minutes Intravenous  Once 09/04/13 1748 09/04/13 1849       PHYSICAL EXAM: Vital signs in last 24 hours: Filed Vitals:   09/06/13 1400 09/06/13 2117 09/07/13 0653 09/07/13 1359  BP: 112/68 151/66 140/65 151/75  Pulse: 88 82 92 89  Temp: 97.6 F (36.4 C) 97.7 F (36.5 C) 97.6 F (36.4 C) 97.1 F (36.2 C)  TempSrc: Oral Oral Oral Oral  Resp: $Remo'18 22 22 20  'gDbYG$ Height:      Weight:   60.056 kg (132 lb 6.4 oz)   SpO2: 99% 92% 98% 94%    Weight change: 2.087 kg (4 lb 9.6 oz) Filed Weights   09/04/13 2039 09/06/13 0656 09/07/13 0653  Weight: 60.328 kg (133 lb) 57.97 kg (127 lb 12.8 oz) 60.056 kg (132 lb 6.4 oz)   Body mass index is 19.54 kg/(m^2).   Gen Exam: Awake and alert with clear speech, ill-appearing   Neck: Supple, No JVD.   Chest: B/L basal rales CVS: S1 S2 Regular, no murmurs.  Abdomen: soft, BS +, non tender, non distended.  Extremities: no edema, lower extremities warm to touch. Neurologic: Non Focal.   Skin: No Rash.   Wounds: N/A.   Intake/Output from previous day:  Intake/Output Summary (Last 24 hours) at 09/07/13 1718 Last  data filed at 09/07/13 1300  Gross per 24 hour  Intake    220 ml  Output   1300 ml  Net  -1080 ml     LAB RESULTS: CBC  Recent Labs Lab 09/04/13 1610 09/05/13 1130 09/07/13 0835  WBC 14.5* 18.4* 16.8*  HGB 11.1* 10.9* 10.8*  HCT 31.3* 30.2* 31.0*  PLT 259 254 243  MCV 97.2 98.1 100.0  MCH 34.5* 35.4* 34.8*  MCHC 35.5 36.1* 34.8  RDW 18.5* 18.5* 18.5*  LYMPHSABS 0.4*  --   --   MONOABS 0.2  --   --   EOSABS 0.0  --   --   BASOSABS 0.0  --   --     Chemistries   Recent Labs Lab 09/04/13 1610 09/05/13 1130 09/07/13 0835  NA 124* 128* 126*  K 3.8 3.2* 4.4  CL 85* 87* 88*  CO2 $Re'27 29 22  'eop$ GLUCOSE 119* 101* 178*  BUN 30* 24* 25*  CREATININE 1.21 1.39*  1.51*  CALCIUM 9.3 8.9 8.6    CBG:  Recent Labs Lab 09/05/13 1627 09/06/13 0638 09/07/13 0643  GLUCAP 175* 154* 138*    GFR Estimated Creatinine Clearance: 38.1 ml/min (by C-G formula based on Cr of 1.51).  Coagulation profile No results found for this basename: INR, PROTIME,  in the last 168 hours  Cardiac Enzymes No results found for this basename: CK, CKMB, TROPONINI, MYOGLOBIN,  in the last 168 hours  No components found with this basename: POCBNP,  No results found for this basename: DDIMER,  in the last 72 hours No results found for this basename: HGBA1C,  in the last 72 hours No results found for this basename: CHOL, HDL, LDLCALC, TRIG, CHOLHDL, LDLDIRECT,  in the last 72 hours  Recent Labs  09/05/13 1130  TSH 1.860   No results found for this basename: VITAMINB12, FOLATE, FERRITIN, TIBC, IRON, RETICCTPCT,  in the last 72 hours No results found for this basename: LIPASE, AMYLASE,  in the last 72 hours  Urine Studies No results found for this basename: UACOL, UAPR, USPG, UPH, UTP, UGL, UKET, UBIL, UHGB, UNIT, UROB, ULEU, UEPI, UWBC, URBC, UBAC, CAST, CRYS, UCOM, BILUA,  in the last 72 hours  MICROBIOLOGY: Recent Results (from the past 240 hour(s))  CULTURE, BLOOD (ROUTINE X 2)     Status:  None   Collection Time    09/04/13  4:15 PM      Result Value Ref Range Status   Specimen Description BLOOD RIGHT FOREARM   Final   Special Requests BOTTLES DRAWN AEROBIC AND ANAEROBIC 5ML   Final   Culture  Setup Time     Final   Value: 09/04/2013 22:54     Performed at [facility]   Culture     Final   Value:        BLOOD CULTURE RECEIVED NO GROWTH TO DATE CULTURE WILL BE HELD FOR 5 DAYS BEFORE ISSUING A FINAL NEGATIVE REPORT     Performed at [facility]   Report Status PENDING   Incomplete  CULTURE, BLOOD (ROUTINE X 2)     Status: None   Collection Time    09/04/13  6:11 PM      Result Value Ref Range Status   Specimen Description BLOOD LEFT ARM   Final   Special Requests BOTTLES DRAWN AEROBIC AND ANAEROBIC 4ML   Final   Culture  Setup Time     Final   Value: 09/04/2013 22:55     Performed at [facility]   Culture     Final   Value:        BLOOD CULTURE RECEIVED NO GROWTH TO DATE CULTURE WILL BE HELD FOR 5 DAYS BEFORE ISSUING A FINAL NEGATIVE REPORT     Performed at [facility]   Report Status PENDING   Incomplete    RADIOLOGY STUDIES/RESULTS: Dg Chest 2 View  09/04/2013   CLINICAL DATA:  Shortness of breath.  EXAM: CHEST  2 VIEW  COMPARISON:  June 17, 2013.  FINDINGS: Stable cardiomediastinal silhouette. Status post coronary artery bypass graft. No pneumothorax or significant pleural effusion is noted. Interval development of bilateral perihilar and basilar interstitial densities are noted concerning for pulmonary edema.  IMPRESSION: Interval development of bilateral perihilar and basilar interstitial densities concerning for pulmonary edema or less likely pneumonia.   Electronically Signed   By: Sabino Dick M.D.   On: 09/04/2013 16:01   Ct Angio Chest Pe W/cm &/or Wo  Cm  09/04/2013   CLINICAL DATA:  Shortness of breath, tachycardia, hypoxia. History of DVT.  EXAM: CT ANGIOGRAPHY CHEST WITH CONTRAST  TECHNIQUE: Multidetector CT imaging of  the chest was performed using the standard protocol during bolus administration of intravenous contrast. Multiplanar CT image reconstructions and MIPs were obtained to evaluate the vascular anatomy.  CONTRAST:  193mL OMNIPAQUE IOHEXOL 350 MG/ML SOLN  COMPARISON:  05/12/2013  FINDINGS: THORACIC INLET/BODY WALL:  Emphysema.  MEDIASTINUM:  Large volume of pneumomediastinum, extending into the neck.  No cardiomegaly. No pericardial effusion. Extensive atherosclerosis, including the coronary arteries. Status post CABG with LIMA and upper venous graft widely patent. The graft associated with the lower ostium marker is not clearly visible, presumably chronic given history. There is occasional respiratory motion, which decreases sensitivity at the affected levels. Overall, exam is diagnostic. There is no evidence of acute pulmonary embolism. No evidence of esophageal thickening. No fluid collection. No adenopathy.  LUNG WINDOWS:  Small right apical pneumothorax, less than 5%.  There is dependent ground-glass and fine interstitial opacities. Changes are similar to CT 08/25/2013. No pleural effusion or interlobular septal thickening.  UPPER ABDOMEN:  Presumed cyst in the upper right liver, stable from previous imaging.  OSSEOUS:  No acute fracture.  No suspicious lytic or blastic lesions.  Critical Value/emergent results were called by telephone at the time of interpretation on 09/04/2013 at 5:34 PM to Dr. Jinny Blossom, [facility] , who verbally acknowledged these results.  Review of the MIP images confirms the above findings.  IMPRESSION: 1. Pneumomediastinum and trace right apical pneumothorax (less than 5%). This is likely from pulmonic air leak given #2. 2. Unchanged ground-glass opacities in the lower lungs, favoring an inflammatory (including drug reaction) pneumonitis or atypical infection. Given immunocompromised state (in the setting of lymphoma), consider PCP pneumonia - which can predispose to pneumothorax). 3. Negative for  acute pulmonary embolism.   Electronically Signed   By: Jorje Guild M.D.   On: 09/04/2013 17:41   Nm Pet Image Restag (ps) Skull Base To Thigh  08/25/2013   CLINICAL DATA:  Subsequent treatment strategy for lymphoma.  EXAM: NUCLEAR MEDICINE PET SKULL BASE TO THIGH  TECHNIQUE: 7.3 mCi F-18 FDG was injected intravenously. Full-ring PET imaging was performed from the skull base to thigh after the radiotracer. CT data was obtained and used for attenuation correction and anatomic localization.  FASTING BLOOD GLUCOSE:  Value: 96 mg/dl  COMPARISON:  PET-CT scan 06/21/2013  FINDINGS: NECK  No hypermetabolic lymph nodes in the neck.  CHEST  There is new hypermetabolic activity associated with ground-glass opacities primarily in the lower lobes but also scattered within the upper lobes. No mass lesion associated with the these ground-glass hypermetabolic metastases.  The hypermetabolic activity extends into the inferior pleural surfaces.  There several small nodules in the upper lobes. For example 4 mm nodule (image 64 or, series 4)in the right upper lobe. The small nodules are unchanged from prior.  ABDOMEN/PELVIS  No abnormal metabolic activity within the liver or spleen. Spleen is normal volume. Interval resolution of the hypermetabolic activity within the right inguinal lymph nodes. The lymph nodes have hypermetabolic activity less than mediastinum. No measurable enlarged lymph nodes remain.  SKELETON  No focal hypermetabolic activity to suggest skeletal metastasis.  IMPRESSION: 1. Interval resolution of hypermetabolic right inguinal lymph nodes ( [place] 1). 2. New extensive hypermetabolic ground-glass opacities predominantly in the lower lobes. Burtis Junes this represent to be a drug reaction or other inflammatory or infectious process. Do not  favor lymphoma recurrence.  Findings conveyed tocancer center triage nurse, [place] 08/25/2013 at11:17.   Electronically Signed   By: Suzy Bouchard M.D.   On: 08/25/2013 11:20     Dg Chest Port 1 View  09/05/2013   CLINICAL DATA:  Pneumothorax, short of breath, nausea  EXAM: PORTABLE CHEST - 1 VIEW  COMPARISON:  Prior chest x-ray and CT chest 09/04/2013  FINDINGS: Stable trace right apical pneumothorax. The mediastinal air is again a present and tracks into the soft tissues of the right greater than left neck. No significant interval exacerbation. Stable cardiac and mediastinal contours. Patient is status post median sternotomy with evidence of prior multivessel CABG including LIMA bypass. Similar to slightly increased left greater than right bibasilar patchy airspace opacities. No pleural effusion. No acute osseous abnormality.  IMPRESSION: 1. Stable pneumomediastinum in trace right apical pneumothorax. 2. Similar to slightly increased left greater than right bibasilar opacities. Differential considerations remain unchanged including pneumonitis and atypical infection.   Electronically Signed   By: Jacqulynn Cadet M.D.   On: 09/05/2013 08:18    Kelvin Cellar, MD  [facility] Pager:[phone]  If 7PM-7AM, please contact night-coverage [email or web address] Password [email or web address] 09/07/2013, 5:18 PM   LOS: 3 days   **Disclaimer: This note may have been dictated with voice recognition software. Similar sounding words can inadvertently be transcribed and this note may contain transcription errors which may not have been corrected upon publication of note.**

## 2013-09-07 NOTE — Progress Notes (Signed)
Name: Willie [name] MRN: [id number] DOB: 1940-12-18    ADMISSION DATE:  09/04/2013 CONSULTATION DATE:  09/04/2013  REFERRING MD :  Willie [name] PRIMARY SERVICE:  Internal  CHIEF COMPLAINT:  PTX, Pneumomediastinum, GGO's.  BRIEF PATIENT DESCRIPTION: 73 y.o. w PMH of NHL, currently on chemo, presented 5/18 with severe SOB.  In ED, [name] to have small 5% right PTX, pneumomediastinum, dependent GGO's.  PCCM was consulted.  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 With PMH including but not limited to ALk pos NHL (had completed CHOP chemo x 6 rounds, stem cell transplant, BCNU, etoposide, cytoxan, began brentuximab 06/28/2013 for inguinal recurrence). PEt scan showed new GGO s/o infection, started on oral abx & prednisone by onc Willie [name]) He was in his Willie [name] until 5/18 when he developed severe SOB mainly with exertion prompting him to go to the ED.  He has had mild dry cough as well as some lower extremity edema.  No fever/chills/sweats, chest pain, N/V/D.  In ED, CTA was negative for PE; however, did reveal large volume pneumomediastinum extending into the neck, a small right apical PTX (< 5%), and dependent GGO's and fine interstitial opacities that were similar to PET scan done on 08/25/13.  Findings were concerning for atypical PNA vs PCP given his immunocompromised state.  PCCM was consulted for further recs.  SUBJECTIVE: Breathing same oob to  chair, appears tired afebrile  No chest pain, wheeze, cough, fevers/chills/sweats.  VITAL SIGNS: Temp:  [97.1 F (36.2 C)-97.7 F (36.5 C)] 97.1 F (36.2 C) (05/21 1359) Pulse Rate:  [82-92] 89 (05/21 1359) Resp:  [20-22] 20 (05/21 1359) BP: (140-151)/(65-75) 151/75 mmHg (05/21 1359) SpO2:  [92 %-98 %] 94 % (05/21 1359) Weight:  [132 lb 6.4 oz (60.056 kg)] 132 lb 6.4 oz (60.056 kg) (05/21 0653)  PHYSICAL EXAMINATION: General: Pleasant male,  in NAD. Neuro: A&O x 3, non-focal.  HEENT: [place]/AT. PERRL, sclerae anicteric. Cardiovascular: RRR, 3/6 PSM.at apex Lungs: Respirations even and unlabored.  CTA bilaterally, No W/R/R.  [place] in place. Abdomen: BS x 4, soft, NT/ND.  Musculoskeletal: No gross deformities, no edema.  Skin: Intact, warm, no rashes.     Recent Labs Lab 09/04/13 1610 09/05/13 1130 09/07/13 0835  NA 124* 128* 126*  K 3.8 3.2* 4.4  CL 85* 87* 88*  CO2 $Re'27 29 22  'Aun$ BUN 30* 24* 25*  CREATININE 1.21 1.39* 1.51*  GLUCOSE 119* 101* 178*    Recent Labs Lab 09/04/13 1610 09/05/13 1130 09/07/13 0835  HGB 11.1* 10.9* 10.8*  HCT 31.3* 30.2* 31.0*  WBC 14.5* 18.4* 16.8*  PLT 259 254 243   Dg Chest 2 View  09/06/2013   CLINICAL DATA:  Right-sided pneumothorax  EXAM: CHEST  2 VIEW  COMPARISON:  Portable chest of Sep 05, 2013. And barium esophagram of the same day  FINDINGS: No pneumothorax visible. Small amounts of mediastinal air the in right perihilar region and in bilateral  upper paratracheal region similar to prior. No pleural effusion. Minimal prominence of pulmonary interstitium. Cardiac silhouette and pulmonary vascularity within limits of normal.  IMPRESSION: 1. No visible right pneumothorax.  Pneumomediastinum persists. 2. Mild stable pulmonary interstitial edema.   Electronically Signed   By: David  Martinique   On: 09/06/2013 09:41   Dg Chest Port 1 View  09/07/2013   CLINICAL DATA:  Short of breath  EXAM: PORTABLE CHEST - 1 VIEW  COMPARISON:  09/06/2013   FINDINGS: Progression of pneumomediastinum, increased subcutaneous gas in the neck.  Negative for pneumothorax  Bibasilar airspace disease unchanged from the prior study. No effusion.  IMPRESSION: Progression of pneumomediastinum and gas in the neck. No pneumothorax.  Bibasilar airspace disease unchanged.   Electronically Signed   By: Franchot Gallo M.D.   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 Lit review does show a 1-10% incidence of pneumonitis with brentuximab (anti CD 30 agent) Small right apical PTX -resolved ,Pneumomediastinum -cause of this is unclear, slight worse on chest x-ray today  RECS: - Continue empiric abx for now, can DC vancomycin and Bactrim -dc solumedrol , by mouth prednisone 60 mg daily for at least 2 weeks until followup chest XR as outpt - F/u PCP smear - can dc bactrim once neg, doubt PCP - Supplemental O2 as needed, wean as able. - May need changes made to chemo regimen, defer to Oncology. -unfortunately with apical pneumothx, cannot perform bronchoscopy , hence manage conservatively   Mod MR - followed by Willie [name] Agree with diuresis per BNP   Willie Mead MD. FCCP. [facility] Pager [phone] If no response call [phone]

## 2013-09-07 NOTE — Progress Notes (Signed)
Rehab Admissions Coordinator Note:  Patient was screened by [name] for appropriateness for an Inpatient Acute Rehab Consult.  At this time, we are recommending Inpatient Rehab consult.  Willie [name] 09/07/2013, 10:23 AM  I can be reached at [phone].

## 2013-09-07 NOTE — Consult Note (Signed)
Physical Medicine and Rehabilitation Consult Reason for Consult: Deconditioning/pneumonia/NHL Referring Physician: [facility]   HPI: Willie [name] is a 73 y.o. 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 No chest pain. Recent PET scan showed findings consistent with pneumonia. Patient developed low-grade fever and cough. Chest x-ray showed interval development of bilateral perihilar and basilar interstitial densities concerning for pulmonary edema or less likely pneumonia. CT angio of the chest showed no pulmonary emboli. Maintained on broad-spectrum antibiotics. Follow up hematology services in reference to non-Hodgkin's trauma. Findings of small right apical PTX and monitored as well as pneumomediastinum. Follow cardiothoracic surgery in reference to pneumomediastinum with Gastrografin swallow completed showing no evidence of leak. No surgical therapy needed. Echocardiogram with ejection fraction of 60% no wall motion abnormalities. Patient placed on Lovenox for DVT prophylaxis. Hyponatremia 126 and monitored. Physical therapy evaluation 09/07/2013 for deconditioning noted patient tends to desaturate with activity. Recommendations made for physical medicine rehabilitation consult  Patient has been more confused since hospitalization according to his son.  Review of Systems  Constitutional: Positive for fever.  Respiratory: Positive for cough and shortness of breath.   Genitourinary:       Nocturia  Neurological: Positive for weakness.  All other systems reviewed and are negative.  Past Medical History  Diagnosis Date  . Hypertension   . History of DVT of lower extremity     06/2001 as first sign of lymphoma: enlarged left inguinal nodes  . Low serum testosterone level 11/10/2011  . 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 Nocturia 11/10/2011  . Normochromic anemia 11/10/2011    Likely result of  prior high dose chemotherapy  . Unexplained weight loss 05/03/2012  . Mitral valvular regurgitation 06/01/12    moderate to severe by echo- asymptomatic  . Dyslipidemia   . RBBB   . Coronary artery disease   . Family history of anesthesia complication     "son, Margreta Journey, larynx collapses" (10/11/2012)  . Heart murmur   . History of blood transfusion 2009-2010    "both related to his chemo" (10/11/2012)  . Chronic renal insufficiency, stage III (moderate)   . Non Hodgkin's lymphoma 2003; 2009    "chemo; stem cell transplant & high powered chemo" (10/10/2012)  . Renal insufficiency   . Deep inguinal pain, right 05/26/2013  . Conjunctivitis, acute, right eye 06/28/2013   Past Surgical History  Procedure Laterality Date  . Bone marrow transplant  2010  . Cardiac catheterization  10/03/2012; 2007  . Coronary angioplasty with stent placement  10/11/2012    "2" (10/11/2012)  . Tonsillectomy  1950's  . Cataract extraction w/ intraocular lens implant Left 1990's  . Coronary artery bypass graft  03/29/2006    "CABG X5" (10/11/2012)   Family History  Problem Relation Age of Onset  . Heart failure Father   . Leukemia Brother 21  . Cancer - Lung Mother    Social History:  reports that he quit smoking about 44 years ago. His smoking use included Cigarettes. He has a 6.5 pack-year smoking history. He has never used smokeless tobacco. He reports that he does not drink alcohol or use illicit drugs. Allergies:  Allergies  Allergen Reactions  . Niaspan [Niacin Er] Rash   Medications Prior to Admission  Medication Sig Dispense Refill  . amoxicillin-clavulanate (AUGMENTIN) 875-125 MG per tablet Take 1 tablet by mouth 2 (two) times daily.  14 tablet  0  . aspirin  EC 81 MG tablet Take 81 mg by mouth every evening.      . Calcium Carbonate-Vitamin D (CALCIUM 500 + D PO) Take 1 tablet by mouth daily.       . cholecalciferol (VITAMIN D) 1000 UNITS tablet Take 1,000 Units by mouth daily.       Marland Kitchen dexamethasone  (DECADRON) 4 MG tablet Take 8 mg by mouth daily. Take 2 tablets twice daily for 2 days after chemotherapy treatment      . fish oil-omega-3 fatty acids 1000 MG capsule Take 1 g by mouth daily.       . metoprolol tartrate (LOPRESSOR) 25 MG tablet Take 25 mg by mouth every morning. verified that it is not XL      . Multiple Vitamin (MULTIVITAMIN WITH MINERALS) TABS Take 1 tablet by mouth daily.      . Potassium 99 MG TABS Take 1 tablet by mouth daily.      . prasugrel (EFFIENT) 10 MG TABS tablet Take 10 mg by mouth every evening.      . predniSONE (DELTASONE) 20 MG tablet Take 3 tablets (60 mg total) by mouth daily with breakfast.  30 tablet  0  . rosuvastatin (CRESTOR) 10 MG tablet Take 10 mg by mouth 2 (two) times a week. On Wednesday and Sunday      . testosterone cypionate (DEPOTESTOTERONE CYPIONATE) 200 MG/ML injection Inject into the muscle every 14 (fourteen) days.      . valACYclovir (VALTREX) 1000 MG tablet Take 500-1,000 mg by mouth daily. Take 2 tablets (1000 mg) twice daily for 7 days then 500 mg daily.      . vitamin B-12 (CYANOCOBALAMIN) 500 MCG tablet Take 500 mcg by mouth daily.      . nitroGLYCERIN (NITROSTAT) 0.4 MG SL tablet Place 1 tablet (0.4 mg total) under the tongue every 5 (five) minutes as needed for chest pain.  25 tablet  2  . ondansetron (ZOFRAN ODT) 8 MG disintegrating tablet Take 1 by mouth twice daily for 2 days to start day after chemotherapy treatment then 1 by mouth every 8 hours as needed for nausea or vomiting  20 tablet  6    Home: Home Living Family/patient expects to be discharged to:: Inpatient rehab Living Arrangements: Spouse/significant other  Functional History: Prior Function Level of Independence: Independent Functional Status:  Mobility: Bed Mobility Overal bed mobility: Modified Independent General bed mobility comments: pt moves ModI, but desats from 97% on 2L O2 to 92%.   Transfers Overall transfer level: Needs assistance Equipment used: 1  person hand held assist Transfers: Sit to/from [facility] Sit to Stand: Min assist Stand pivot transfers: Min assist General transfer comment: pt only able to SPT to chair 2/2 SOB.  cues for scooting to edge of bed prior to standing and use of UEs.  pt desated from 92% on 2L O2 sitting EOB to 83% after SPT to chair.  pt required >17mins and increasing O2 to 3L in order to bring sats back up to 90%.  RN made aware.        ADL:    Cognition: Cognition Overall Cognitive Status: Within Functional Limits for tasks assessed Cognition Arousal/Alertness: Awake/alert Behavior During Therapy: WFL for tasks assessed/performed Overall Cognitive Status: Within Functional Limits for tasks assessed  Blood pressure 140/65, pulse 92, temperature 97.6 F (36.4 C), temperature source Oral, resp. rate 22, height 5\' 9"  (1.753 m), weight 60.056 kg (132 lb 6.4 oz), SpO2 98.00%. Physical Exam  Constitutional:  He is oriented to person, place, and time.  73 year old frail Caucasian male  HENT:  Head: Normocephalic.  Eyes: EOM are normal.  Neck: Normal range of motion. Neck supple. No thyromegaly present.  Cardiovascular: Normal rate and regular rhythm.   Respiratory:  Decreased breath sounds at the bases  GI: Soft. Bowel sounds are normal. He exhibits no distension.  Neurological: He is alert and oriented to person, place, and time.   motor strength is 4+/5 bilateral deltoid, bicep, tricep, grip, hip flexors, knee extensors, ankle dorsiflexion plantar flexion Sensation is intact to light touch and proprioception bilateral upper and lower limbs Patient moves slowly responses are delayed sometimes repetition as needed  Results for orders placed during the hospital encounter of 09/04/13 (from the past 24 hour(s))  GLUCOSE, CAPILLARY     Status: Abnormal   Collection Time    09/07/13  6:43 AM      Result Value Ref Range   Glucose-Capillary 138 (*) 70 - 99 mg/dL  BASIC METABOLIC PANEL      Status: Abnormal   Collection Time    09/07/13  8:35 AM      Result Value Ref Range   Sodium 126 (*) 137 - 147 mEq/L   Potassium 4.4  3.7 - 5.3 mEq/L   Chloride 88 (*) 96 - 112 mEq/L   CO2 22  19 - 32 mEq/L   Glucose, Bld 178 (*) 70 - 99 mg/dL   BUN 25 (*) 6 - 23 mg/dL   Creatinine, Ser 1.51 (*) 0.50 - 1.35 mg/dL   Calcium 8.6  8.4 - 10.5 mg/dL   GFR calc non Af Amer 45 (*) >90 mL/min   GFR calc Af Amer 52 (*) >90 mL/min  CBC     Status: Abnormal   Collection Time    09/07/13  8:35 AM      Result Value Ref Range   WBC 16.8 (*) 4.0 - 10.5 K/uL   RBC 3.10 (*) 4.22 - 5.81 MIL/uL   Hemoglobin 10.8 (*) 13.0 - 17.0 g/dL   HCT 31.0 (*) 39.0 - 52.0 %   MCV 100.0  78.0 - 100.0 fL   MCH 34.8 (*) 26.0 - 34.0 pg   MCHC 34.8  30.0 - 36.0 g/dL   RDW 18.5 (*) 11.5 - 15.5 %   Platelets 243  150 - 400 K/uL   Dg Chest 2 View  09/06/2013   CLINICAL DATA:  Right-sided pneumothorax  EXAM: CHEST  2 VIEW  COMPARISON:  Portable chest of Sep 05, 2013. And barium esophagram of the same day  FINDINGS: No pneumothorax visible. Small amounts of mediastinal air the in right perihilar region and in bilateral upper paratracheal region similar to prior. No pleural effusion. Minimal prominence of pulmonary interstitium. Cardiac silhouette and pulmonary vascularity within limits of normal.  IMPRESSION: 1. No visible right pneumothorax.  Pneumomediastinum persists. 2. Mild stable pulmonary interstitial edema.   Electronically Signed   By: David  Martinique   On: 09/06/2013 09:41   Dg Esophagus W/water Sol Cm  09/05/2013   CLINICAL DATA:  Evaluate for esophageal rupture  EXAM: ESOPHOGRAM/BARIUM SWALLOW  TECHNIQUE: Single contrast examination was performed using water-soluble contrast.  FLUOROSCOPY TIME:  55 seconds  50 cc Omnipaque 300  COMPARISON:  09/05/2013  FINDINGS: The esophagus is patent. No stricture or mass identified. No abnormal extraluminal extravasation of contrast identified to suggest esophageal leak/ rupture.   IMPRESSION: Negative exam.   Electronically Signed   By: Lovena Le  Clovis Riley M.D.   On: 09/05/2013 15:26    Assessment/Plan: Diagnosis: Deconditioning after pneumonia in a patient with non-Hodgkin's lymphoma 1. Does the need for close, 24 hr/day medical supervision in concert with the patient's rehab needs make it unreasonable for this patient to be served in a less intensive setting? Yes 2. Co-Morbidities requiring supervision/potential complications: Chronic renal insufficiency, severe mitral regurgitation, coronary artery disease 3. Due to bladder management, bowel management, safety, skin/wound care, disease management, medication administration, pain management and patient education, does the patient require 24 hr/day rehab nursing? Yes 4. Does the patient require coordinated care of a physician, rehab nurse, PT (1-2 hrs/day, 5 days/week), OT (1-2 hrs/day, 5 days/week) and SLP (0.5-1 hrs/day, 5 days/week) to address physical and functional deficits in the context of the above medical diagnosis(es)? Yes Addressing deficits in the following areas: balance, endurance, locomotion, strength, transferring, bowel/bladder control, bathing, dressing, feeding, grooming, toileting and cognition 5. Can the patient actively participate in an intensive therapy program of at least 3 hrs of therapy per day at least 5 days per week? Yes 6. The potential for patient to make measurable gains while on inpatient rehab is excellent 7. Anticipated functional outcomes upon discharge from inpatient rehab are modified independent  with PT, modified independent with OT, modified independent with SLP. 8. Estimated rehab length of stay to reach the above functional goals is: [facility] 12 days 9. Does the patient have adequate social supports to accommodate these discharge functional goals? Yes 10. Anticipated D/C setting: Home 11. Anticipated post D/C treatments: [place] therapy 12. Overall Rehab/Functional Prognosis:  good  RECOMMENDATIONS: This patient's condition is appropriate for continued rehabilitative care in the following setting: CIR Patient has agreed to participate in recommended program. Yes Note that insurance prior authorization may be required for reimbursement for recommended care.  Comment: May need dietary consult for poor appetite. May benefit from trial of Megace    09/07/2013

## 2013-09-07 NOTE — Progress Notes (Signed)
Patient ID: Willie [name], male   DOB: Nov 13, 1940, 73 y.o.   MRN: [id number] No obvious esophogeal tear on Ba swallow No resp distress at rest O2 sats >/= 92% on 3 l nasal O2 Minimal dry rales lung bases He remains afebrile Cultures/urinary Legionella antigen negative I would consider stopping Vanco/Cefipime; change to PO Bactrim Continue full dose steroids as this is most likely chemo related chemical pneumonitis: could change to PO prednisone. Agree, chemo program will need to be re-evaluated although I have had 1 patient who was able to go back on brentuximab after recovery from pulmonary toxicity.  I will leave this decision to his new Oncologist, Dr Alvy Bimler. Please forward cc of D/C summary to her when pt ready for D/C.

## 2013-09-07 NOTE — Evaluation (Signed)
Physical Therapy Evaluation Patient Details Name: Willie [name] MRN: [id number] DOB: 1940-07-13 Today's Date: 09/07/2013   History of Present Illness  pt presents with PNA/Pneumonitis related to chemo treatments.  pt being treated for Non-Hodgekins Lymphoma.    Clinical Impression  Pt presents very debilitated 2/2 SOB.  Pt desats from 97% on 2L O2 at rest in supine to 83% on 2L O2 after coming to sit in recliner.  Pt needs >35mins and increasing O2 to 3L in order to increase sats to 90%.  RN made aware of O2 sats.  At this time pt is not safe for return to home with wife.  May need to consider CIR at D/C to maximize independence and decrease burden of care.  Will continue to follow.      Follow Up Recommendations CIR    Equipment Recommendations   (Rollator)    Recommendations for Other Services OT consult;Rehab consult     Precautions / Restrictions Precautions Precautions: Fall Precaution Comments: Respiratory Sats Restrictions Weight Bearing Restrictions: No      Mobility  Bed Mobility Overal bed mobility: Modified Independent             General bed mobility comments: pt moves ModI, but desats from 97% on 2L O2 to 92%.    Transfers Overall transfer level: Needs assistance Equipment used: 1 person hand held assist Transfers: Sit to/from [facility] Sit to Stand: Min assist Stand pivot transfers: Min assist       General transfer comment: pt only able to SPT to chair 2/2 SOB.  cues for scooting to edge of bed prior to standing and use of UEs.  pt desated from 92% on 2L O2 sitting EOB to 83% after SPT to chair.  pt required >48mins and increasing O2 to 3L in order to bring sats back up to 90%.  RN made aware.    Ambulation/Gait                Stairs            Wheelchair Mobility    Modified Rankin (Stroke Patients Only)       Balance Overall balance assessment: Needs assistance         Standing balance support: Single  extremity supported Standing balance-Leahy Scale: Poor                               Pertinent Vitals/Pain Denied pain.      Home Living Family/patient expects to be discharged to:: Inpatient rehab Living Arrangements: Spouse/significant other                    Prior Function Level of Independence: Independent               Hand Dominance        Extremity/Trunk Assessment   Upper Extremity Assessment: Generalized weakness           Lower Extremity Assessment: Generalized weakness         Communication   Communication: No difficulties  Cognition Arousal/Alertness: Awake/alert Behavior During Therapy: WFL for tasks assessed/performed Overall Cognitive Status: Within Functional Limits for tasks assessed                      General Comments      Exercises        Assessment/Plan    PT Assessment Patient needs continued PT services  PT Diagnosis Difficulty walking;Generalized weakness (Decondition/Debility)   PT Problem List Decreased strength;Decreased activity tolerance;Decreased balance;Decreased mobility;Decreased knowledge of use of DME;Cardiopulmonary status limiting activity  PT Treatment Interventions DME instruction;Gait training;Stair training;Functional mobility training;Therapeutic activities;Therapeutic exercise;Balance training;Patient/family education   PT Goals (Current goals can be found in the Care Plan section) Acute Rehab PT Goals Patient Stated Goal: Back to work PT Goal Formulation: With patient Time For Goal Achievement: 09/21/13 Potential to Achieve Goals: Good    Frequency Min 3X/week   Barriers to discharge        Co-evaluation               End of Session Equipment Utilized During Treatment: Gait belt;Oxygen Activity Tolerance: Patient limited by fatigue Patient left: in chair;with call bell/phone within reach;with family/visitor present Nurse Communication: Mobility status (O2  sats)         Time: 4008-6761 PT Time Calculation (min): 29 min   Charges:   PT Evaluation $Initial PT Evaluation Tier I: 1 Procedure PT Treatments $Therapeutic Activity: 8-22 mins   PT G CodesCatarina Hartshorn, PT [phone] 09/07/2013, 9:44 AM

## 2013-09-08 DIAGNOSIS — N183 Chronic kidney disease, stage 3 unspecified: Secondary | ICD-10-CM

## 2013-09-08 DIAGNOSIS — I2510 Atherosclerotic heart disease of native coronary artery without angina pectoris: Secondary | ICD-10-CM

## 2013-09-08 LAB — BASIC METABOLIC PANEL
BUN: 30 mg/dL — ABNORMAL HIGH (ref 6–23)
CO2: 26 mEq/L (ref 19–32)
Calcium: 8.9 mg/dL (ref 8.4–10.5)
Chloride: 87 mEq/L — ABNORMAL LOW (ref 96–112)
Creatinine, Ser: 1.58 mg/dL — ABNORMAL HIGH (ref 0.50–1.35)
GFR, EST AFRICAN AMERICAN: 49 mL/min — AB (ref >90)
GFR, EST NON AFRICAN AMERICAN: 42 mL/min — AB (ref >90)
Glucose, Bld: 84 mg/dL (ref 70–99)
POTASSIUM: 4.1 meq/L (ref 3.7–5.3)
SODIUM: 124 meq/L — AB (ref 137–147)

## 2013-09-08 LAB — CBC
HEMATOCRIT: 32.3 % — AB (ref 39.0–52.0)
HEMOGLOBIN: 11.3 g/dL — AB (ref 13.0–17.0)
MCH: 34.8 pg — ABNORMAL HIGH (ref 26.0–34.0)
MCHC: 35 g/dL (ref 30.0–36.0)
MCV: 99.4 fL (ref 78.0–100.0)
Platelets: 237 10*3/uL (ref 150–400)
RBC: 3.25 MIL/uL — AB (ref 4.22–5.81)
RDW: 18 % — ABNORMAL HIGH (ref 11.5–15.5)
WBC: 17.6 10*3/uL — ABNORMAL HIGH (ref 4.0–10.5)

## 2013-09-08 NOTE — Progress Notes (Signed)
Physical Therapy Treatment Patient Details Name: Willie [name] MRN: [id number] DOB: [birth year] Today's Date: 09/08/2013    History of Present Illness pt presents with PNA/Pneumonitis related to chemo treatments.  pt being treated for Non-Hodgekins Lymphoma.      PT Comments    Patient requires increased time and resting breaks with activity. Remained on 3L of O2 throughout session. Patient was able to walk in the room this session which is showing some good progress. Still limited by overall fatigue and will need to work on building activity tolerance in order to return home safely with his wife. Continue to recommend comprehensive inpatient rehab (CIR) for post-acute therapy needs.   Follow Up Recommendations  CIR     Equipment Recommendations       Recommendations for Other Services       Precautions / Restrictions Precautions Precautions: Fall Precaution Comments: Respiratory Sats    Mobility  Bed Mobility Overal bed mobility: Modified Independent                Transfers Overall transfer level: Needs assistance Equipment used: 1 person hand held assist   Sit to Stand: Min assist         General transfer comment: Min to ensure balance as patient initially unsteady with stand. Appears more efficient with stand this session  Ambulation/Gait Ambulation/Gait assistance: Min assist Ambulation Distance (Feet): 16 Feet Assistive device: 1 person hand held assist Gait Pattern/deviations: Step-through pattern;Decreased stride length;Narrow base of support;Trunk flexed Gait velocity: decreased   General Gait Details: Patient able to walk 8 ft to couch in room then required a sitting rest break but ableo to walk back and sit up in recliner. Patient staggering with some imbalance but requiring only min A overall   Stairs            Wheelchair Mobility    Modified Rankin (Stroke Patients Only)       Balance                                     Cognition Arousal/Alertness: Awake/alert Behavior During Therapy: WFL for tasks assessed/performed Overall Cognitive Status: Within Functional Limits for tasks assessed                      Exercises General Exercises - Lower Extremity Ankle Circles/Pumps: AROM;Both;10 reps    General Comments        Pertinent Vitals/Pain Denied pain 3/4 dyspnea    Home Living                      Prior Function            PT Goals (current goals can now be found in the care plan section) Progress towards PT goals: Progressing toward goals    Frequency  Min 3X/week    PT Plan Current plan remains appropriate    Co-evaluation             End of Session Equipment Utilized During Treatment: Gait belt;Oxygen Activity Tolerance: Patient limited by fatigue Patient left: in chair;with call bell/phone within reach;with family/visitor present     Time: 0626-9485 PT Time Calculation (min): 23 min  Charges:  $Gait Training: 8-22 mins $Therapeutic Activity: 8-22 mins                    G Codes:  Tonia Brooms Robinette 09/08/2013, 11:01 AM 09/08/2013 [place] PTA [phone] pager [phone] office

## 2013-09-08 NOTE — Progress Notes (Addendum)
I have placed order for OT eval which I will need to proceed with [facility] insurance for a possible admission next week pending bed availability. [phone]

## 2013-09-08 NOTE — Progress Notes (Signed)
PATIENT DETAILS Name: Willie [name] Age: 73 y.o. 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  -Case discussed with Dr Elsworth Soho, did not feel this reflected PCP, recommended stopping Vancomycin and bactrim  Hyponatremia -Sodium of 126-?SIADH from lung opacties- Monitor and follow. -Placing him on 1200 mL fluid restriction  Severe Mitral Regurgitation -2-D echo showing moderate, holosystolicprolapse, involving the anterior leaflet. 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 He had an initial complete response to 6 cycles of CHOP chemotherapy. He relapsed October 2009 with extensive infiltration of tumor into the right iliopsoas muscle presenting as an erythematous nodular cutaneous mass in the right inguinal region. No other disease outside of the muscle noted on PET scan. He again achieved remission with ICE chemotherapy and then went onto receive BCNU, etoposide and  Cytoxan conditioning followed by autologous stem cell transplant at [facility] in January 2010. He was recently found to have a recurrence in the soft tissues of the right inguinal region. He began brentuximab on 06/28/2013."If above symptoms deemed to be drug related -may need to change regimen.  Hx of CAD-s/p CABG in 2007, and PCI in June 2014 -c/w ASA/Effient/Metoprolol and statins -SOB likely secondary to above, no chest pain.  Disposition: Possible d/c to CIR when stable  DVT Prophylaxis: Prophylactic Lovenox  Code Status: Full code   Family Communication Spouse at bedside  Procedures:  None  CONSULTS:  pulmonary/intensive care and CTVS  Time spent 25 min    MEDICATIONS: Scheduled Meds: . aspirin EC  81 mg Oral QPM  . atorvastatin  20 mg Oral Once per day on Sun Wed  . cholecalciferol  1,000 Units Oral Daily  . vitamin B-12  500 mcg Oral Daily  . docusate sodium  100 mg Oral BID  . enoxaparin (LOVENOX) injection  40 mg Subcutaneous Q24H  . metoprolol tartrate  25 mg Oral q morning - 10a  . multivitamin with minerals  1 tablet Oral Daily  . prasugrel  10 mg Oral QPM  . predniSONE  60 mg Oral Q breakfast  . sodium chloride  3 mL Intravenous Q12H  . valACYclovir  500 mg Oral Daily   Continuous Infusions:   PRN Meds:.sodium chloride, acetaminophen, acetaminophen, albuterol, alum & mag hydroxide-simeth, HYDROcodone-acetaminophen, nitroGLYCERIN, ondansetron (ZOFRAN) IV, ondansetron, senna-docusate, sodium chloride  Antibiotics: Anti-infectives   Start     Dose/Rate Route Frequency Ordered Stop   09/07/13 1000  sulfamethoxazole-trimethoprim (BACTRIM DS) 800-160 MG per tablet 1 tablet  Status:  Discontinued     1 tablet Oral Every 12 hours 09/07/13 0908 09/07/13 1422   09/06/13 2200  ceFEPIme (MAXIPIME) 1 g in dextrose 5 % 50  mL IVPB  Status:  Discontinued     1 g 100 mL/hr over 30 Minutes Intravenous Every 12 hours 09/06/13 1337 09/07/13 0908   09/05/13 1000   valACYclovir (VALTREX) tablet 500 mg     500 mg Oral Daily 09/04/13 2012     09/05/13 0800  vancomycin (VANCOCIN) 500 mg in sodium chloride 0.9 % 100 mL IVPB  Status:  Discontinued     500 mg 100 mL/hr over 60 Minutes Intravenous Every 12 hours 09/04/13 2032 09/07/13 0908   09/05/13 0200  ceFEPIme (MAXIPIME) 1 g in dextrose 5 % 50 mL IVPB  Status:  Discontinued     1 g 100 mL/hr over 30 Minutes Intravenous Every 8 hours 09/04/13 2012 09/06/13 1337   09/04/13 2100  sulfamethoxazole-trimethoprim (BACTRIM) 302.4 mg in dextrose 5 % 250 mL IVPB  Status:  Discontinued     15 mg/kg/day  60.5 kg 268.9 mL/hr over 60 Minutes Intravenous 3 times per day 09/04/13 2001 09/07/13 0908   09/04/13 1800  vancomycin (VANCOCIN) IVPB 1000 mg/200 mL premix     1,000 mg 200 mL/hr over 60 Minutes Intravenous  Once 09/04/13 1748 09/04/13 1952   09/04/13 1800  piperacillin-tazobactam (ZOSYN) IVPB 3.375 g     3.375 g 100 mL/hr over 30 Minutes Intravenous  Once 09/04/13 1748 09/04/13 1849       PHYSICAL EXAM: Vital signs in last 24 hours: Filed Vitals:   09/07/13 2111 09/08/13 0604 09/08/13 1041 09/08/13 1354  BP: 120/58 120/53 120/53 136/62  Pulse: 95 105  97  Temp: 97.8 F (36.6 C) 97.8 F (36.6 C)  97.7 F (36.5 C)  TempSrc: Oral Oral  Oral  Resp: $Remo'18 18  20  'TsuUP$ Height:      Weight:      SpO2: 97% 96%  98%    Weight change:  Filed Weights   09/04/13 2039 09/06/13 0656 09/07/13 0653  Weight: 60.328 kg (133 lb) 57.97 kg (127 lb 12.8 oz) 60.056 kg (132 lb 6.4 oz)   Body mass index is 19.54 kg/(m^2).   Gen Exam: Awake and alert with clear speech, ill-appearing   Neck: Supple, No JVD.   Chest: B/L basal rales CVS: S1 S2 Regular, no murmurs.  Abdomen: soft, BS +, non tender, non distended.  Extremities: no edema, lower extremities warm to touch. Neurologic: Non Focal.   Skin: No Rash.   Wounds: N/A.   Intake/Output from previous day:  Intake/Output Summary (Last 24 hours) at 09/08/13  1653 Last data filed at 09/08/13 1500  Gross per 24 hour  Intake    840 ml  Output   1300 ml  Net   -460 ml     LAB RESULTS: CBC  Recent Labs Lab 09/04/13 1610 09/05/13 1130 09/07/13 0835 09/08/13 0720  WBC 14.5* 18.4* 16.8* 17.6*  HGB 11.1* 10.9* 10.8* 11.3*  HCT 31.3* 30.2* 31.0* 32.3*  PLT 259 254 243 237  MCV 97.2 98.1 100.0 99.4  MCH 34.5* 35.4* 34.8* 34.8*  MCHC 35.5 36.1* 34.8 35.0  RDW 18.5* 18.5* 18.5* 18.0*  LYMPHSABS 0.4*  --   --   --   MONOABS 0.2  --   --   --   EOSABS 0.0  --   --   --   BASOSABS 0.0  --   --   --     Chemistries   Recent Labs Lab 09/04/13 1610 09/05/13 1130 09/07/13 0835 09/08/13 0720  NA 124* 128* 126* 124*  K 3.8 3.2*  4.4 4.1  CL 85* 87* 88* 87*  CO2 $Re'27 29 22 26  'zIH$ GLUCOSE 119* 101* 178* 84  BUN 30* 24* 25* 30*  CREATININE 1.21 1.39* 1.51* 1.58*  CALCIUM 9.3 8.9 8.6 8.9    CBG:  Recent Labs Lab 09/05/13 1627 09/06/13 0638 09/07/13 0643  GLUCAP 175* 154* 138*    GFR Estimated Creatinine Clearance: 36.5 ml/min (by C-G formula based on Cr of 1.58).  Coagulation profile No results found for this basename: INR, PROTIME,  in the last 168 hours  Cardiac Enzymes No results found for this basename: CK, CKMB, TROPONINI, MYOGLOBIN,  in the last 168 hours  No components found with this basename: POCBNP,  No results found for this basename: DDIMER,  in the last 72 hours No results found for this basename: HGBA1C,  in the last 72 hours No results found for this basename: CHOL, HDL, LDLCALC, TRIG, CHOLHDL, LDLDIRECT,  in the last 72 hours No results found for this basename: TSH, T4TOTAL, FREET3, T3FREE, THYROIDAB,  in the last 72 hours No results found for this basename: VITAMINB12, FOLATE, FERRITIN, TIBC, IRON, RETICCTPCT,  in the last 72 hours No results found for this basename: LIPASE, AMYLASE,  in the last 72 hours  Urine Studies No results found for this basename: UACOL, UAPR, USPG, UPH, UTP, UGL, UKET, UBIL, UHGB,  UNIT, UROB, ULEU, UEPI, UWBC, URBC, UBAC, CAST, CRYS, UCOM, BILUA,  in the last 72 hours  MICROBIOLOGY: Recent Results (from the past 240 hour(s))  CULTURE, BLOOD (ROUTINE X 2)     Status: None   Collection Time    09/04/13  4:15 PM      Result Value Ref Range Status   Specimen Description BLOOD RIGHT FOREARM   Final   Special Requests BOTTLES DRAWN AEROBIC AND ANAEROBIC 5ML   Final   Culture  Setup Time     Final   Value: 09/04/2013 22:54     Performed at [facility]   Culture     Final   Value:        BLOOD CULTURE RECEIVED NO GROWTH TO DATE CULTURE WILL BE HELD FOR 5 DAYS BEFORE ISSUING A FINAL NEGATIVE REPORT     Performed at [facility]   Report Status PENDING   Incomplete  CULTURE, BLOOD (ROUTINE X 2)     Status: None   Collection Time    09/04/13  6:11 PM      Result Value Ref Range Status   Specimen Description BLOOD LEFT ARM   Final   Special Requests BOTTLES DRAWN AEROBIC AND ANAEROBIC 4ML   Final   Culture  Setup Time     Final   Value: 09/04/2013 22:55     Performed at [facility]   Culture     Final   Value:        BLOOD CULTURE RECEIVED NO GROWTH TO DATE CULTURE WILL BE HELD FOR 5 DAYS BEFORE ISSUING A FINAL NEGATIVE REPORT     Performed at [facility]   Report Status PENDING   Incomplete    RADIOLOGY STUDIES/RESULTS: Dg Chest 2 View  09/04/2013   CLINICAL DATA:  Shortness of breath.  EXAM: CHEST  2 VIEW  COMPARISON:  June 17, 2013.  FINDINGS: Stable cardiomediastinal silhouette. Status post coronary artery bypass graft. No pneumothorax or significant pleural effusion is noted. Interval development of bilateral perihilar and basilar interstitial densities are noted concerning for pulmonary edema.  IMPRESSION: Interval development of bilateral  perihilar and basilar interstitial densities concerning for pulmonary edema or less likely pneumonia.   Electronically Signed   By: Sabino Dick M.D.   On: 09/04/2013 16:01   Ct Angio  Chest Pe W/cm &/or Wo Cm  09/04/2013   CLINICAL DATA:  Shortness of breath, tachycardia, hypoxia. History of DVT.  EXAM: CT ANGIOGRAPHY CHEST WITH CONTRAST  TECHNIQUE: Multidetector CT imaging of the chest was performed using the standard protocol during bolus administration of intravenous contrast. Multiplanar CT image reconstructions and MIPs were obtained to evaluate the vascular anatomy.  CONTRAST:  185mL OMNIPAQUE IOHEXOL 350 MG/ML SOLN  COMPARISON:  05/12/2013  FINDINGS: THORACIC INLET/BODY WALL:  Emphysema.  MEDIASTINUM:  Large volume of pneumomediastinum, extending into the neck.  No cardiomegaly. No pericardial effusion. Extensive atherosclerosis, including the coronary arteries. Status post CABG with LIMA and upper venous graft widely patent. The graft associated with the lower ostium marker is not clearly visible, presumably chronic given history. There is occasional respiratory motion, which decreases sensitivity at the affected levels. Overall, exam is diagnostic. There is no evidence of acute pulmonary embolism. No evidence of esophageal thickening. No fluid collection. No adenopathy.  LUNG WINDOWS:  Small right apical pneumothorax, less than 5%.  There is dependent ground-glass and fine interstitial opacities. Changes are similar to CT 08/25/2013. No pleural effusion or interlobular septal thickening.  UPPER ABDOMEN:  Presumed cyst in the upper right liver, stable from previous imaging.  OSSEOUS:  No acute fracture.  No suspicious lytic or blastic lesions.  Critical Value/emergent results were called by telephone at the time of interpretation on 09/04/2013 at 5:34 PM to Dr. Jinny Blossom, [facility] , who verbally acknowledged these results.  Review of the MIP images confirms the above findings.  IMPRESSION: 1. Pneumomediastinum and trace right apical pneumothorax (less than 5%). This is likely from pulmonic air leak given #2. 2. Unchanged ground-glass opacities in the lower lungs, favoring an inflammatory  (including drug reaction) pneumonitis or atypical infection. Given immunocompromised state (in the setting of lymphoma), consider PCP pneumonia - which can predispose to pneumothorax). 3. Negative for acute pulmonary embolism.   Electronically Signed   By: Jorje Guild M.D.   On: 09/04/2013 17:41   Nm Pet Image Restag (ps) Skull Base To Thigh  08/25/2013   CLINICAL DATA:  Subsequent treatment strategy for lymphoma.  EXAM: NUCLEAR MEDICINE PET SKULL BASE TO THIGH  TECHNIQUE: 7.3 mCi F-18 FDG was injected intravenously. Full-ring PET imaging was performed from the skull base to thigh after the radiotracer. CT data was obtained and used for attenuation correction and anatomic localization.  FASTING BLOOD GLUCOSE:  Value: 96 mg/dl  COMPARISON:  PET-CT scan 06/21/2013  FINDINGS: NECK  No hypermetabolic lymph nodes in the neck.  CHEST  There is new hypermetabolic activity associated with ground-glass opacities primarily in the lower lobes but also scattered within the upper lobes. No mass lesion associated with the these ground-glass hypermetabolic metastases.  The hypermetabolic activity extends into the inferior pleural surfaces.  There several small nodules in the upper lobes. For example 4 mm nodule (image 64 or, series 4)in the right upper lobe. The small nodules are unchanged from prior.  ABDOMEN/PELVIS  No abnormal metabolic activity within the liver or spleen. Spleen is normal volume. Interval resolution of the hypermetabolic activity within the right inguinal lymph nodes. The lymph nodes have hypermetabolic activity less than mediastinum. No measurable enlarged lymph nodes remain.  SKELETON  No focal hypermetabolic activity to suggest skeletal metastasis.  IMPRESSION: 1.  Interval resolution of hypermetabolic right inguinal lymph nodes ( [place] 1). 2. New extensive hypermetabolic ground-glass opacities predominantly in the lower lobes. Burtis Junes this represent to be a drug reaction or other inflammatory or  infectious process. Do not favor lymphoma recurrence.  Findings conveyed tocancer center triage nurse, [place] 08/25/2013 at11:17.   Electronically Signed   By: Suzy Bouchard M.D.   On: 08/25/2013 11:20   Dg Chest Port 1 View  09/05/2013   CLINICAL DATA:  Pneumothorax, short of breath, nausea  EXAM: PORTABLE CHEST - 1 VIEW  COMPARISON:  Prior chest x-ray and CT chest 09/04/2013  FINDINGS: Stable trace right apical pneumothorax. The mediastinal air is again a present and tracks into the soft tissues of the right greater than left neck. No significant interval exacerbation. Stable cardiac and mediastinal contours. Patient is status post median sternotomy with evidence of prior multivessel CABG including LIMA bypass. Similar to slightly increased left greater than right bibasilar patchy airspace opacities. No pleural effusion. No acute osseous abnormality.  IMPRESSION: 1. Stable pneumomediastinum in trace right apical pneumothorax. 2. Similar to slightly increased left greater than right bibasilar opacities. Differential considerations remain unchanged including pneumonitis and atypical infection.   Electronically Signed   By: Jacqulynn Cadet M.D.   On: 09/05/2013 08:18    Kelvin Cellar, MD  [facility] Pager:[phone]  If 7PM-7AM, please contact night-coverage [email or web address] Password [email or web address] 09/08/2013, 4:53 PM   LOS: 4 days   **Disclaimer: This note may have been dictated with voice recognition software. Similar sounding words can inadvertently be transcribed and this note may contain transcription errors which may not have been corrected upon publication of note.**

## 2013-09-09 ENCOUNTER — Inpatient Hospital Stay (HOSPITAL_COMMUNITY): Payer: Medicare Other

## 2013-09-09 DIAGNOSIS — C8589 Other specified types of non-Hodgkin lymphoma, extranodal and solid organ sites: Secondary | ICD-10-CM

## 2013-09-09 DIAGNOSIS — I10 Essential (primary) hypertension: Secondary | ICD-10-CM

## 2013-09-09 LAB — CBC
HEMATOCRIT: 29.3 % — AB (ref 39.0–52.0)
Hemoglobin: 10.3 g/dL — ABNORMAL LOW (ref 13.0–17.0)
MCH: 35.2 pg — ABNORMAL HIGH (ref 26.0–34.0)
MCHC: 35.2 g/dL (ref 30.0–36.0)
MCV: 100 fL (ref 78.0–100.0)
Platelets: 194 10*3/uL (ref 150–400)
RBC: 2.93 MIL/uL — AB (ref 4.22–5.81)
RDW: 17.8 % — ABNORMAL HIGH (ref 11.5–15.5)
WBC: 16.5 10*3/uL — ABNORMAL HIGH (ref 4.0–10.5)

## 2013-09-09 LAB — BASIC METABOLIC PANEL
BUN: 32 mg/dL — ABNORMAL HIGH (ref 6–23)
CHLORIDE: 86 meq/L — AB (ref 96–112)
CO2: 24 meq/L (ref 19–32)
Calcium: 8.7 mg/dL (ref 8.4–10.5)
Creatinine, Ser: 1.46 mg/dL — ABNORMAL HIGH (ref 0.50–1.35)
GFR calc Af Amer: 54 mL/min — ABNORMAL LOW (ref >90)
GFR calc non Af Amer: 47 mL/min — ABNORMAL LOW (ref >90)
Glucose, Bld: 100 mg/dL — ABNORMAL HIGH (ref 70–99)
POTASSIUM: 3.9 meq/L (ref 3.7–5.3)
SODIUM: 122 meq/L — AB (ref 137–147)

## 2013-09-09 MED ORDER — SODIUM CHLORIDE 1 G PO TABS
1.0000 g | ORAL_TABLET | Freq: Three times a day (TID) | ORAL | Status: DC
  Administered 2013-09-09 – 2013-09-11 (×9): 1 g via ORAL
  Filled 2013-09-09 (×13): qty 1

## 2013-09-09 NOTE — Evaluation (Signed)
Occupational Therapy Evaluation Patient Details Name: Willie [name] MRN: [id number] DOB: 1941-04-11 Today's Date: 09/09/2013    History of Present Illness pt presents with PNA/Pneumonitis related to chemo treatments.  pt being treated for Non-Hodgekins Lymphoma.     Clinical Impression   Pt presents with below problem list. Pt independent with ADLs, PTA. Feel pt will benefit from acute OT to increase independence prior to d/c. Recommending CIR for additional rehab prior to d/c home.  OT retrieved nurse during session as OT not trending up quickly despite being on 6L of O2.     Follow Up Recommendations  CIR    Equipment Recommendations  Other (comment) (defer to next venue)    Recommendations for Other Services Rehab consult     Precautions / Restrictions Precautions Precautions: Fall Precaution Comments: Respiratory Sats Restrictions Weight Bearing Restrictions: No      Mobility Bed Mobility               General bed mobility comments: pt sitting in chair  Transfers Overall transfer level: Needs assistance   Transfers: Sit to/from Stand Sit to Stand: Min assist;Min guard         General transfer comment: cues for technique.    Balance                                            ADL Overall ADL's : Needs assistance/impaired Eating/Feeding: Sitting;Supervision/ safety   Grooming: Applying deodorant;Wash/dry face;Standing;Sitting;Min guard;Oral care (simulated oral care)   Upper Body Bathing: Sitting;Set up;Supervision/ safety   Lower Body Bathing: Sit to/from stand;Minimal assistance   Upper Body Dressing : Set up;Supervision/safety;Sitting   Lower Body Dressing: Minimal assistance;Sit to/from stand   Toilet Transfer: Min guard;Ambulation;RW;Regular Toilet;Grab bars   Toileting- Clothing Manipulation and Hygiene: Minimal assistance;Sit to/from stand       Functional mobility during ADLs: Rolling walker;Min guard;Minimal  assistance General ADL Comments: Educated on energy conservation techniques and also explained how participating in activities helps increase activity tolerance. Recommended sitting for most of dressing/bathing. Educated on safe shoewear, discussed rugs, and also use of bag on walker to carry items. Educated on correct breathing technique and demonstrated. Pt fatigues very easily.  Simulated some ADL tasks at sink and also performed a few (applied deodorant, washed face).  Pt able to don/doff socks while sitting.     Vision                     Perception     Praxis      Pertinent Vitals/Pain No pain reported. O2 in 70's-80's at times on 6L of O2 during session. Nurse notified. Unsure of correct reading of pulse oximeter (read that HR in 40's at one point, but trended up) but O2 did trend up to 90's. Educated on deep breathing technique. Pt on 5L of O2 at end of session with O2 in 90's.       Hand Dominance     Extremity/Trunk Assessment Upper Extremity Assessment Upper Extremity Assessment: Generalized weakness   Lower Extremity Assessment Lower Extremity Assessment: Defer to PT evaluation       Communication Communication Communication: HOH   Cognition Arousal/Alertness: Awake/alert Behavior During Therapy: WFL for tasks assessed/performed Overall Cognitive Status: Within Functional Limits for tasks assessed  General Comments       Exercises       Shoulder Instructions      Home Living Family/patient expects to be discharged to:: Inpatient rehab Living Arrangements: Spouse/significant other                               Additional Comments: Has tub/shower and regular commode with sink close by      Prior Functioning/Environment Level of Independence: Independent             OT Diagnosis: Generalized weakness; cardiopulmonary status limiting activity   OT Problem List: Decreased strength;Decreased activity  tolerance;Impaired balance (sitting and/or standing);Decreased knowledge of use of DME or AE;Decreased knowledge of precautions;Cardiopulmonary status limiting activity   OT Treatment/Interventions: Self-care/ADL training;DME and/or AE instruction;Energy conservation;Therapeutic exercise;Therapeutic activities;Patient/family education;Balance training    OT Goals(Current goals can be found in the care plan section) Acute Rehab OT Goals Patient Stated Goal: not stated OT Goal Formulation: With patient Time For Goal Achievement: 09/23/13 Potential to Achieve Goals: Good ADL Goals Pt Will Perform Grooming: with modified independence;standing Pt Will Perform Upper Body Bathing: with modified independence;sitting Pt Will Perform Lower Body Bathing: with modified independence;sit to/from stand Pt Will Perform Lower Body Dressing: with modified independence;sit to/from stand Pt Will Transfer to Toilet: with modified independence;ambulating;regular height toilet;grab bars Pt Will Perform Toileting - Clothing Manipulation and hygiene: with modified independence;sit to/from stand Additional ADL Goal #1: Pt will independently verbalize 3/3 energy conservation techniques.  OT Frequency: Min 2X/week   Barriers to D/C:            Co-evaluation              End of Session Equipment Utilized During Treatment: Gait belt;Oxygen;Rolling walker Nurse Communication: Other (comment) (O2 sats-went and got nurse to come in session)  Activity Tolerance: Patient limited by fatigue Patient left: in chair;with call bell/phone within reach;with family/visitor present   Time: 1151-1229 OT Time Calculation (min): 38 min Charges:  OT General Charges $OT Visit: 1 Procedure OT Evaluation $Initial OT Evaluation Tier I: 1 Procedure OT Treatments $Self Care/Home Management : 8-22 mins G-Codes:    Benito Mccreedy OTR/L [phone] 09/09/2013, 12:50 PM

## 2013-09-09 NOTE — Progress Notes (Signed)
PATIENT DETAILS Name: Willie [name] Age: 73 y.o. 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  -Case discussed with Dr Elsworth Soho, did not feel this reflected PCP, recommended stopping Vancomycin and bactrim  Hyponatremia -Sodium of 126-?SIADH from lung opacties -On 1200 mL fluid restriction -Sodium continues to trend down to 122 -Will provide Sodium Chloride tabs today, repeats labs in am.   Severe Mitral Regurgitation -2-D echo showing moderate, holosystolicprolapse, involving the anterior leaflet. 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 He had an initial complete response to 6 cycles of CHOP chemotherapy. He relapsed October 2009 with extensive infiltration of tumor into the right iliopsoas muscle presenting as an erythematous nodular cutaneous mass in the right inguinal region. No other disease outside of the muscle  noted on PET scan. He again achieved remission with ICE chemotherapy and then went onto receive BCNU, etoposide and Cytoxan conditioning followed by autologous stem cell transplant at [facility] in January 2010. He was recently found to have a recurrence in the soft tissues of the right inguinal region. He began brentuximab on 06/28/2013."If above symptoms deemed to be drug related -may need to change regimen.  Hx of CAD-s/p CABG in 2007, and PCI in June 2014 -c/w ASA/Effient/Metoprolol and statins -SOB likely secondary to above, no chest pain.  Disposition: Possible d/c to CIR when stable  DVT Prophylaxis: Prophylactic Lovenox  Code Status: Full code   Family Communication Spouse at bedside  Procedures:  None  CONSULTS:  pulmonary/intensive care and CTVS  Time spent 30 min    MEDICATIONS: Scheduled Meds: . aspirin EC  81 mg Oral QPM  . atorvastatin  20 mg Oral Once per day on Sun Wed  . cholecalciferol  1,000 Units Oral Daily  . vitamin B-12  500 mcg Oral Daily  . docusate sodium  100 mg Oral BID  . enoxaparin (LOVENOX) injection  40 mg Subcutaneous Q24H  . metoprolol tartrate  25 mg Oral q morning - 10a  . multivitamin with minerals  1 tablet Oral Daily  . prasugrel  10 mg Oral QPM  . predniSONE  60 mg Oral Q breakfast  . sodium chloride  3 mL Intravenous Q12H  . sodium chloride  1 g Oral TID WC  . valACYclovir  500 mg Oral Daily   Continuous Infusions:   PRN Meds:.sodium chloride, acetaminophen, acetaminophen, albuterol, alum & mag hydroxide-simeth, HYDROcodone-acetaminophen, nitroGLYCERIN, ondansetron (ZOFRAN) IV, ondansetron, senna-docusate, sodium chloride  Antibiotics: Anti-infectives   Start     Dose/Rate Route Frequency Ordered Stop   09/07/13 1000  sulfamethoxazole-trimethoprim (BACTRIM DS) 800-160 MG per tablet 1 tablet  Status:  Discontinued     1 tablet Oral Every 12 hours 09/07/13 0908 09/07/13 1422   09/06/13 2200  ceFEPIme (MAXIPIME) 1 g in  dextrose 5 % 50 mL IVPB  Status:  Discontinued     1 g 100 mL/hr over 30 Minutes Intravenous Every 12 hours 09/06/13 1337 09/07/13 0908   09/05/13 1000  valACYclovir (VALTREX) tablet 500 mg     500 mg Oral Daily 09/04/13 2012     09/05/13 0800  vancomycin (VANCOCIN) 500 mg in sodium chloride 0.9 % 100 mL IVPB  Status:  Discontinued     500 mg 100 mL/hr over 60 Minutes Intravenous Every 12 hours 09/04/13 2032 09/07/13 0908   09/05/13 0200  ceFEPIme (MAXIPIME) 1 g in dextrose 5 % 50 mL IVPB  Status:  Discontinued     1 g 100 mL/hr over 30 Minutes Intravenous Every 8 hours 09/04/13 2012 09/06/13 1337   09/04/13 2100  sulfamethoxazole-trimethoprim (BACTRIM) 302.4 mg in dextrose 5 % 250 mL IVPB  Status:  Discontinued     15 mg/kg/day  60.5 kg 268.9 mL/hr over 60 Minutes Intravenous 3 times per day 09/04/13 2001 09/07/13 0908   09/04/13 1800  vancomycin (VANCOCIN) IVPB 1000 mg/200 mL premix     1,000 mg 200 mL/hr over 60 Minutes Intravenous  Once 09/04/13 1748 09/04/13 1952   09/04/13 1800  piperacillin-tazobactam (ZOSYN) IVPB 3.375 g     3.375 g 100 mL/hr over 30 Minutes Intravenous  Once 09/04/13 1748 09/04/13 1849       PHYSICAL EXAM: Vital signs in last 24 hours: Filed Vitals:   09/08/13 2000 09/09/13 0615 09/09/13 1046 09/09/13 1406  BP: 118/66 126/64 119/65 114/57  Pulse: 108 102 110 95  Temp: 98 F (36.7 C) 98.1 F (36.7 C) 97.8 F (36.6 C) 97.7 F (36.5 C)  TempSrc: Oral Oral Oral Oral  Resp: $Remo'20 20  20  'KkzfU$ Height:      Weight:      SpO2: 98% 98%  100%    Weight change:  Filed Weights   09/04/13 2039 09/06/13 0656 09/07/13 0653  Weight: 60.328 kg (133 lb) 57.97 kg (127 lb 12.8 oz) 60.056 kg (132 lb 6.4 oz)   Body mass index is 19.54 kg/(m^2).   Gen Exam: Awake and alert with clear speech, ill-appearing   Neck: Supple, No JVD.   Chest: B/L basal rales CVS: S1 S2 Regular, no murmurs.  Abdomen: soft, BS +, non tender, non distended.  Extremities: no edema, lower  extremities warm to touch. Neurologic: Non Focal.   Skin: No Rash.   Wounds: N/A.   Intake/Output from previous day:  Intake/Output Summary (Last 24 hours) at 09/09/13 1444 Last data filed at 09/09/13 1407  Gross per 24 hour  Intake    420 ml  Output    500 ml  Net    -80 ml     LAB RESULTS: CBC  Recent Labs Lab 09/04/13 1610 09/05/13 1130 09/07/13 0835 09/08/13 0720 09/09/13 0400  WBC 14.5* 18.4* 16.8* 17.6* 16.5*  HGB 11.1* 10.9* 10.8* 11.3* 10.3*  HCT 31.3* 30.2* 31.0* 32.3* 29.3*  PLT 259 254 243 237 194  MCV 97.2 98.1 100.0 99.4 100.0  MCH 34.5* 35.4* 34.8* 34.8* 35.2*  MCHC 35.5 36.1* 34.8 35.0 35.2  RDW 18.5* 18.5* 18.5* 18.0* 17.8*  LYMPHSABS 0.4*  --   --   --   --   MONOABS 0.2  --   --   --   --  EOSABS 0.0  --   --   --   --   BASOSABS 0.0  --   --   --   --     Chemistries   Recent Labs Lab 09/04/13 1610 09/05/13 1130 09/07/13 0835 09/08/13 0720 09/09/13 0400  NA 124* 128* 126* 124* 122*  K 3.8 3.2* 4.4 4.1 3.9  CL 85* 87* 88* 87* 86*  CO2 $Re'27 29 22 26 24  'HtY$ GLUCOSE 119* 101* 178* 84 100*  BUN 30* 24* 25* 30* 32*  CREATININE 1.21 1.39* 1.51* 1.58* 1.46*  CALCIUM 9.3 8.9 8.6 8.9 8.7    CBG:  Recent Labs Lab 09/05/13 1627 09/06/13 0638 09/07/13 0643  GLUCAP 175* 154* 138*    GFR Estimated Creatinine Clearance: 39.4 ml/min (by C-G formula based on Cr of 1.46).  Coagulation profile No results found for this basename: INR, PROTIME,  in the last 168 hours  Cardiac Enzymes No results found for this basename: CK, CKMB, TROPONINI, MYOGLOBIN,  in the last 168 hours  No components found with this basename: POCBNP,  No results found for this basename: DDIMER,  in the last 72 hours No results found for this basename: HGBA1C,  in the last 72 hours No results found for this basename: CHOL, HDL, LDLCALC, TRIG, CHOLHDL, LDLDIRECT,  in the last 72 hours No results found for this basename: TSH, T4TOTAL, FREET3, T3FREE, THYROIDAB,  in the last  72 hours No results found for this basename: VITAMINB12, FOLATE, FERRITIN, TIBC, IRON, RETICCTPCT,  in the last 72 hours No results found for this basename: LIPASE, AMYLASE,  in the last 72 hours  Urine Studies No results found for this basename: UACOL, UAPR, USPG, UPH, UTP, UGL, UKET, UBIL, UHGB, UNIT, UROB, ULEU, UEPI, UWBC, URBC, UBAC, CAST, CRYS, UCOM, BILUA,  in the last 72 hours  MICROBIOLOGY: Recent Results (from the past 240 hour(s))  CULTURE, BLOOD (ROUTINE X 2)     Status: None   Collection Time    09/04/13  4:15 PM      Result Value Ref Range Status   Specimen Description BLOOD RIGHT FOREARM   Final   Special Requests BOTTLES DRAWN AEROBIC AND ANAEROBIC 5ML   Final   Culture  Setup Time     Final   Value: 09/04/2013 22:54     Performed at [facility]   Culture     Final   Value:        BLOOD CULTURE RECEIVED NO GROWTH TO DATE CULTURE WILL BE HELD FOR 5 DAYS BEFORE ISSUING A FINAL NEGATIVE REPORT     Performed at [facility]   Report Status PENDING   Incomplete  CULTURE, BLOOD (ROUTINE X 2)     Status: None   Collection Time    09/04/13  6:11 PM      Result Value Ref Range Status   Specimen Description BLOOD LEFT ARM   Final   Special Requests BOTTLES DRAWN AEROBIC AND ANAEROBIC 4ML   Final   Culture  Setup Time     Final   Value: 09/04/2013 22:55     Performed at [facility]   Culture     Final   Value:        BLOOD CULTURE RECEIVED NO GROWTH TO DATE CULTURE WILL BE HELD FOR 5 DAYS BEFORE ISSUING A FINAL NEGATIVE REPORT     Performed at [facility]   Report Status PENDING   Incomplete    RADIOLOGY STUDIES/RESULTS:  Dg Chest 2 View  09/04/2013   CLINICAL DATA:  Shortness of breath.  EXAM: CHEST  2 VIEW  COMPARISON:  June 17, 2013.  FINDINGS: Stable cardiomediastinal silhouette. Status post coronary artery bypass graft. No pneumothorax or significant pleural effusion is noted. Interval development of bilateral perihilar and  basilar interstitial densities are noted concerning for pulmonary edema.  IMPRESSION: Interval development of bilateral perihilar and basilar interstitial densities concerning for pulmonary edema or less likely pneumonia.   Electronically Signed   By: Sabino Dick M.D.   On: 09/04/2013 16:01   Ct Angio Chest Pe W/cm &/or Wo Cm  09/04/2013   CLINICAL DATA:  Shortness of breath, tachycardia, hypoxia. History of DVT.  EXAM: CT ANGIOGRAPHY CHEST WITH CONTRAST  TECHNIQUE: Multidetector CT imaging of the chest was performed using the standard protocol during bolus administration of intravenous contrast. Multiplanar CT image reconstructions and MIPs were obtained to evaluate the vascular anatomy.  CONTRAST:  163mL OMNIPAQUE IOHEXOL 350 MG/ML SOLN  COMPARISON:  05/12/2013  FINDINGS: THORACIC INLET/BODY WALL:  Emphysema.  MEDIASTINUM:  Large volume of pneumomediastinum, extending into the neck.  No cardiomegaly. No pericardial effusion. Extensive atherosclerosis, including the coronary arteries. Status post CABG with LIMA and upper venous graft widely patent. The graft associated with the lower ostium marker is not clearly visible, presumably chronic given history. There is occasional respiratory motion, which decreases sensitivity at the affected levels. Overall, exam is diagnostic. There is no evidence of acute pulmonary embolism. No evidence of esophageal thickening. No fluid collection. No adenopathy.  LUNG WINDOWS:  Small right apical pneumothorax, less than 5%.  There is dependent ground-glass and fine interstitial opacities. Changes are similar to CT 08/25/2013. No pleural effusion or interlobular septal thickening.  UPPER ABDOMEN:  Presumed cyst in the upper right liver, stable from previous imaging.  OSSEOUS:  No acute fracture.  No suspicious lytic or blastic lesions.  Critical Value/emergent results were called by telephone at the time of interpretation on 09/04/2013 at 5:34 PM to Dr. Jinny Blossom, [facility] , who  verbally acknowledged these results.  Review of the MIP images confirms the above findings.  IMPRESSION: 1. Pneumomediastinum and trace right apical pneumothorax (less than 5%). This is likely from pulmonic air leak given #2. 2. Unchanged ground-glass opacities in the lower lungs, favoring an inflammatory (including drug reaction) pneumonitis or atypical infection. Given immunocompromised state (in the setting of lymphoma), consider PCP pneumonia - which can predispose to pneumothorax). 3. Negative for acute pulmonary embolism.   Electronically Signed   By: Jorje Guild M.D.   On: 09/04/2013 17:41   Nm Pet Image Restag (ps) Skull Base To Thigh  08/25/2013   CLINICAL DATA:  Subsequent treatment strategy for lymphoma.  EXAM: NUCLEAR MEDICINE PET SKULL BASE TO THIGH  TECHNIQUE: 7.3 mCi F-18 FDG was injected intravenously. Full-ring PET imaging was performed from the skull base to thigh after the radiotracer. CT data was obtained and used for attenuation correction and anatomic localization.  FASTING BLOOD GLUCOSE:  Value: 96 mg/dl  COMPARISON:  PET-CT scan 06/21/2013  FINDINGS: NECK  No hypermetabolic lymph nodes in the neck.  CHEST  There is new hypermetabolic activity associated with ground-glass opacities primarily in the lower lobes but also scattered within the upper lobes. No mass lesion associated with the these ground-glass hypermetabolic metastases.  The hypermetabolic activity extends into the inferior pleural surfaces.  There several small nodules in the upper lobes. For example 4 mm nodule (image 64 or, series 4)in the right  upper lobe. The small nodules are unchanged from prior.  ABDOMEN/PELVIS  No abnormal metabolic activity within the liver or spleen. Spleen is normal volume. Interval resolution of the hypermetabolic activity within the right inguinal lymph nodes. The lymph nodes have hypermetabolic activity less than mediastinum. No measurable enlarged lymph nodes remain.  SKELETON  No focal  hypermetabolic activity to suggest skeletal metastasis.  IMPRESSION: 1. Interval resolution of hypermetabolic right inguinal lymph nodes ( [place] 1). 2. New extensive hypermetabolic ground-glass opacities predominantly in the lower lobes. Burtis Junes this represent to be a drug reaction or other inflammatory or infectious process. Do not favor lymphoma recurrence.  Findings conveyed tocancer center triage nurse, [place] 08/25/2013 at11:17.   Electronically Signed   By: Suzy Bouchard M.D.   On: 08/25/2013 11:20   Dg Chest Port 1 View  09/05/2013   CLINICAL DATA:  Pneumothorax, short of breath, nausea  EXAM: PORTABLE CHEST - 1 VIEW  COMPARISON:  Prior chest x-ray and CT chest 09/04/2013  FINDINGS: Stable trace right apical pneumothorax. The mediastinal air is again a present and tracks into the soft tissues of the right greater than left neck. No significant interval exacerbation. Stable cardiac and mediastinal contours. Patient is status post median sternotomy with evidence of prior multivessel CABG including LIMA bypass. Similar to slightly increased left greater than right bibasilar patchy airspace opacities. No pleural effusion. No acute osseous abnormality.  IMPRESSION: 1. Stable pneumomediastinum in trace right apical pneumothorax. 2. Similar to slightly increased left greater than right bibasilar opacities. Differential considerations remain unchanged including pneumonitis and atypical infection.   Electronically Signed   By: Jacqulynn Cadet M.D.   On: 09/05/2013 08:18    Kelvin Cellar, MD  [facility] Pager:[phone]  If 7PM-7AM, please contact night-coverage [email or web address] Password [email or web address] 09/09/2013, 2:44 PM   LOS: 5 days   **Disclaimer: This note may have been dictated with voice recognition software. Similar sounding words can inadvertently be transcribed and this note may contain transcription errors which may not have been corrected upon publication of note.**

## 2013-09-10 LAB — CULTURE, BLOOD (ROUTINE X 2)
CULTURE: NO GROWTH
Culture: NO GROWTH

## 2013-09-10 LAB — BASIC METABOLIC PANEL
BUN: 31 mg/dL — AB (ref 6–23)
CALCIUM: 9.4 mg/dL (ref 8.4–10.5)
CO2: 22 mEq/L (ref 19–32)
CREATININE: 1.52 mg/dL — AB (ref 0.50–1.35)
Chloride: 86 mEq/L — ABNORMAL LOW (ref 96–112)
GFR calc Af Amer: 51 mL/min — ABNORMAL LOW (ref >90)
GFR, EST NON AFRICAN AMERICAN: 44 mL/min — AB (ref >90)
GLUCOSE: 117 mg/dL — AB (ref 70–99)
Potassium: 4.4 mEq/L (ref 3.7–5.3)
Sodium: 123 mEq/L — ABNORMAL LOW (ref 137–147)

## 2013-09-10 LAB — CBC
HCT: 30 % — ABNORMAL LOW (ref 39.0–52.0)
Hemoglobin: 10.4 g/dL — ABNORMAL LOW (ref 13.0–17.0)
MCH: 34.9 pg — AB (ref 26.0–34.0)
MCHC: 34.7 g/dL (ref 30.0–36.0)
MCV: 100.7 fL — ABNORMAL HIGH (ref 78.0–100.0)
PLATELETS: 233 10*3/uL (ref 150–400)
RBC: 2.98 MIL/uL — ABNORMAL LOW (ref 4.22–5.81)
RDW: 17.8 % — ABNORMAL HIGH (ref 11.5–15.5)
WBC: 19.1 10*3/uL — ABNORMAL HIGH (ref 4.0–10.5)

## 2013-09-10 MED ORDER — OXYMETAZOLINE HCL 0.05 % NA SOLN
1.0000 | Freq: Two times a day (BID) | NASAL | Status: DC
  Administered 2013-09-10 – 2013-09-12 (×4): 1 via NASAL
  Filled 2013-09-10: qty 15

## 2013-09-10 NOTE — Progress Notes (Signed)
Clinical Social Work Department CLINICAL SOCIAL WORK PLACEMENT NOTE 09/10/2013  Patient:  Willie [name], Willie [name]  Account Number:  [id number] Admit date:  09/04/2013  Clinical Social Worker:  Blima Rich, Latanya Presser  Date/time:  09/10/2013 05:38 PM  Clinical Social Work is seeking post-discharge placement for this patient at the following level of care:   [place]   (*CSW will update this form in Epic as items are completed)   09/10/2013  Patient/family provided with [facility] Department of Clinical Social Work's list of facilities offering this level of care within the geographic area requested by the patient (or if unable, by the patient's family).  09/10/2013  Patient/family informed of their freedom to choose among providers that offer the needed level of care, that participate in Medicare, Medicaid or managed care program needed by the patient, have an available bed and are willing to accept the patient.  09/10/2013  Patient/family informed of [facility]' ownership interest in [facility], as well as of the fact that they are under no obligation to receive care at this facility.  PASARR submitted to [employer] on 09/10/2013 PASARR number received from [employer] on 09/10/2013  FL2 transmitted to all facilities in geographic area requested by pt/family on  09/08/2013 FL2 transmitted to all facilities within larger geographic area on   Patient informed that his/her managed care company has contracts with or will negotiate with  certain facilities, including the following:     Patient/family informed of bed offers received:   Patient chooses bed at  Physician recommends and patient chooses bed at    Patient to be transferred to  on   Patient to be transferred to facility by   The following physician request were entered in Epic:   Additional Comments:

## 2013-09-10 NOTE — Progress Notes (Signed)
PATIENT DETAILS Name: Willie [name] Age: 73 y.o. Sex: male Date of Birth: 03/25/1941 Admit Date: 09/04/2013 Admitting Physician Delfina Redwood, MD GLO:VFIEP,PIRJJO DAVIDSON, MD  Subjective: Continues to work with physical therapy. Weaning oxygen. He is tolerating PO intake, remains afebrile.   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  -Case discussed with Dr Elsworth Soho, did not feel this reflected PCP, recommended stopping Vancomycin and bactrim  Hyponatremia -Sodium of 126-?SIADH from lung opacties -On 1200 mL fluid restriction -Sodium continues to trend down to 122 -Will provide Sodium Chloride tabs today, repeats labs in am.   Severe Mitral Regurgitation -2-D echo showing moderate, holosystolicprolapse, involving the anterior leaflet. 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 He had an initial complete response to 6 cycles of CHOP chemotherapy. He relapsed October 2009 with extensive infiltration of tumor into the right iliopsoas muscle presenting as an erythematous nodular cutaneous mass in the right inguinal region. No other disease outside of the muscle noted on PET scan. He again achieved remission with ICE  chemotherapy and then went onto receive BCNU, etoposide and Cytoxan conditioning followed by autologous stem cell transplant at [facility] in January 2010. He was recently found to have a recurrence in the soft tissues of the right inguinal region. He began brentuximab on 06/28/2013."If above symptoms deemed to be drug related -may need to change regimen.  Hx of CAD-s/p CABG in 2007, and PCI in June 2014 -c/w ASA/Effient/Metoprolol and statins -SOB likely secondary to above, no chest pain.  Disposition: Possible d/c to CIR when stable  DVT Prophylaxis: Prophylactic Lovenox  Code Status: Full code   Family Communication Spouse at bedside  Procedures:  None  CONSULTS:  pulmonary/intensive care and CTVS  Time spent 25 min    MEDICATIONS: Scheduled Meds: . aspirin EC  81 mg Oral QPM  . atorvastatin  20 mg Oral Once per day on Sun Wed  . cholecalciferol  1,000 Units Oral Daily  . vitamin B-12  500 mcg Oral Daily  . docusate sodium  100 mg Oral BID  . enoxaparin (LOVENOX) injection  40 mg Subcutaneous Q24H  . metoprolol tartrate  25 mg Oral q morning - 10a  . multivitamin with minerals  1 tablet Oral Daily  . prasugrel  10 mg Oral QPM  . predniSONE  60 mg Oral Q breakfast  . sodium chloride  3 mL Intravenous Q12H  . sodium chloride  1 g Oral TID WC  . valACYclovir  500 mg Oral Daily   Continuous Infusions:   PRN Meds:.sodium chloride, acetaminophen, acetaminophen, albuterol, alum & mag hydroxide-simeth, HYDROcodone-acetaminophen, nitroGLYCERIN, ondansetron (ZOFRAN) IV, ondansetron, senna-docusate, sodium chloride  Antibiotics: Anti-infectives   Start     Dose/Rate Route Frequency Ordered Stop   09/07/13 1000  sulfamethoxazole-trimethoprim (BACTRIM DS) 800-160 MG per tablet 1 tablet  Status:  Discontinued     1 tablet Oral  Every 12 hours 09/07/13 0908 09/07/13 1422   09/06/13 2200  ceFEPIme (MAXIPIME) 1 g in dextrose 5 % 50 mL IVPB  Status:  Discontinued     1 g 100  mL/hr over 30 Minutes Intravenous Every 12 hours 09/06/13 1337 09/07/13 0908   09/05/13 1000  valACYclovir (VALTREX) tablet 500 mg     500 mg Oral Daily 09/04/13 2012     09/05/13 0800  vancomycin (VANCOCIN) 500 mg in sodium chloride 0.9 % 100 mL IVPB  Status:  Discontinued     500 mg 100 mL/hr over 60 Minutes Intravenous Every 12 hours 09/04/13 2032 09/07/13 0908   09/05/13 0200  ceFEPIme (MAXIPIME) 1 g in dextrose 5 % 50 mL IVPB  Status:  Discontinued     1 g 100 mL/hr over 30 Minutes Intravenous Every 8 hours 09/04/13 2012 09/06/13 1337   09/04/13 2100  sulfamethoxazole-trimethoprim (BACTRIM) 302.4 mg in dextrose 5 % 250 mL IVPB  Status:  Discontinued     15 mg/kg/day  60.5 kg 268.9 mL/hr over 60 Minutes Intravenous 3 times per day 09/04/13 2001 09/07/13 0908   09/04/13 1800  vancomycin (VANCOCIN) IVPB 1000 mg/200 mL premix     1,000 mg 200 mL/hr over 60 Minutes Intravenous  Once 09/04/13 1748 09/04/13 1952   09/04/13 1800  piperacillin-tazobactam (ZOSYN) IVPB 3.375 g     3.375 g 100 mL/hr over 30 Minutes Intravenous  Once 09/04/13 1748 09/04/13 1849       PHYSICAL EXAM: Vital signs in last 24 hours: Filed Vitals:   09/10/13 0632 09/10/13 1011 09/10/13 1238 09/10/13 1539  BP:  112/57 100/47   Pulse:  86 85   Temp:   97.9 F (36.6 C)   TempSrc:   Oral   Resp:  18 20   Height:      Weight: 60.782 kg (134 lb)     SpO2:  100% 100% 100%    Weight change:  Filed Weights   09/06/13 0656 09/07/13 0653 09/10/13 0254  Weight: 57.97 kg (127 lb 12.8 oz) 60.056 kg (132 lb 6.4 oz) 60.782 kg (134 lb)   Body mass index is 19.78 kg/(m^2).   Gen Exam: Awake and alert with clear speech, ill-appearing   Neck: Supple, No JVD.   Chest: B/L basal rales CVS: S1 S2 Regular, no murmurs.  Abdomen: soft, BS +, non tender, non distended.  Extremities: no edema, lower extremities warm to touch. Neurologic: Non Focal.   Skin: No Rash.   Wounds: N/A.   Intake/Output from previous  day:  Intake/Output Summary (Last 24 hours) at 09/10/13 1543 Last data filed at 09/10/13 1229  Gross per 24 hour  Intake    803 ml  Output      2 ml  Net    801 ml     LAB RESULTS: CBC  Recent Labs Lab 09/04/13 1610 09/05/13 1130 09/07/13 0835 09/08/13 0720 09/09/13 0400 09/10/13 0618  WBC 14.5* 18.4* 16.8* 17.6* 16.5* 19.1*  HGB 11.1* 10.9* 10.8* 11.3* 10.3* 10.4*  HCT 31.3* 30.2* 31.0* 32.3* 29.3* 30.0*  PLT 259 254 243 237 194 233  MCV 97.2 98.1 100.0 99.4 100.0 100.7*  MCH 34.5* 35.4* 34.8* 34.8* 35.2* 34.9*  MCHC 35.5 36.1* 34.8 35.0 35.2 34.7  RDW 18.5* 18.5* 18.5* 18.0* 17.8* 17.8*  LYMPHSABS 0.4*  --   --   --   --   --   MONOABS 0.2  --   --   --   --   --  EOSABS 0.0  --   --   --   --   --   BASOSABS 0.0  --   --   --   --   --     Chemistries   Recent Labs Lab 09/05/13 1130 09/07/13 0835 09/08/13 0720 09/09/13 0400 09/10/13 0618  NA 128* 126* 124* 122* 123*  K 3.2* 4.4 4.1 3.9 4.4  CL 87* 88* 87* 86* 86*  CO2 _0 GLUCOSE 101* 178* 84 100* 117*  BUN 24* 25* 30* 32* 31*  CREATININE 1.39* 1.51* 1.58* 1.46* 1.52*  CALCIUM 8.9 8.6 8.9 8.7 9.4    CBG:  Recent Labs Lab 09/05/13 1627 09/06/13 0638 09/07/13 0643  GLUCAP 175* 154* 138*    GFR Estimated Creatinine Clearance: 38.3 ml/min (by C-G formula based on Cr of 1.52).  Coagulation profile No results found for this basename: INR, PROTIME,  in the last 168 hours  Cardiac Enzymes No results found for this basename: CK, CKMB, TROPONINI, MYOGLOBIN,  in the last 168 hours  No components found with this basename: POCBNP,  No results found for this basename: DDIMER,  in the last 72 hours No results found for this basename: HGBA1C,  in the last 72 hours No results found for this basename: CHOL, HDL, LDLCALC, TRIG, CHOLHDL, LDLDIRECT,  in the last 72 hours No results found for this basename: TSH, T4TOTAL, FREET3, T3FREE, THYROIDAB,  in the last 72 hours No results found for this  basename: VITAMINB12, FOLATE, FERRITIN, TIBC, IRON, RETICCTPCT,  in the last 72 hours No results found for this basename: LIPASE, AMYLASE,  in the last 72 hours  Urine Studies No results found for this basename: UACOL, UAPR, USPG, UPH, UTP, UGL, UKET, UBIL, UHGB, UNIT, UROB, ULEU, UEPI, UWBC, URBC, UBAC, CAST, CRYS, UCOM, BILUA,  in the last 72 hours  MICROBIOLOGY: Recent Results (from the past 240 hour(s))  CULTURE, BLOOD (ROUTINE X 2)     Status: None   Collection Time    09/04/13  4:15 PM      Result Value Ref Range Status   Specimen Description BLOOD RIGHT FOREARM   Final   Special Requests BOTTLES DRAWN AEROBIC AND ANAEROBIC 5ML   Final   Culture  Setup Time     Final   Value: 09/04/2013 22:54     Performed at [facility]   Culture     Final   Value: NO GROWTH 5 DAYS     Performed at [facility]   Report Status 09/10/2013 FINAL   Final  CULTURE, BLOOD (ROUTINE X 2)     Status: None   Collection Time    09/04/13  6:11 PM      Result Value Ref Range Status   Specimen Description BLOOD LEFT ARM   Final   Special Requests BOTTLES DRAWN AEROBIC AND ANAEROBIC 4ML   Final   Culture  Setup Time     Final   Value: 09/04/2013 22:55     Performed at [facility]   Culture     Final   Value: NO GROWTH 5 DAYS     Performed at [facility]   Report Status 09/10/2013 FINAL   Final    RADIOLOGY STUDIES/RESULTS: Dg Chest 2 View  09/04/2013   CLINICAL DATA:  Shortness of breath.  EXAM: CHEST  2 VIEW  COMPARISON:  June 17, 2013.  FINDINGS: Stable cardiomediastinal silhouette. Status post coronary artery bypass graft. No  pneumothorax or significant pleural effusion is noted. Interval development of bilateral perihilar and basilar interstitial densities are noted concerning for pulmonary edema.  IMPRESSION: Interval development of bilateral perihilar and basilar interstitial densities concerning for pulmonary edema or less likely pneumonia.   Electronically  Signed   By: Sabino Dick M.D.   On: 09/04/2013 16:01   Ct Angio Chest Pe W/cm &/or Wo Cm  09/04/2013   CLINICAL DATA:  Shortness of breath, tachycardia, hypoxia. History of DVT.  EXAM: CT ANGIOGRAPHY CHEST WITH CONTRAST  TECHNIQUE: Multidetector CT imaging of the chest was performed using the standard protocol during bolus administration of intravenous contrast. Multiplanar CT image reconstructions and MIPs were obtained to evaluate the vascular anatomy.  CONTRAST:  176m OMNIPAQUE IOHEXOL 350 MG/ML SOLN  COMPARISON:  05/12/2013  FINDINGS: THORACIC INLET/BODY WALL:  Emphysema.  MEDIASTINUM:  Large volume of pneumomediastinum, extending into the neck.  No cardiomegaly. No pericardial effusion. Extensive atherosclerosis, including the coronary arteries. Status post CABG with LIMA and upper venous graft widely patent. The graft associated with the lower ostium marker is not clearly visible, presumably chronic given history. There is occasional respiratory motion, which decreases sensitivity at the affected levels. Overall, exam is diagnostic. There is no evidence of acute pulmonary embolism. No evidence of esophageal thickening. No fluid collection. No adenopathy.  LUNG WINDOWS:  Small right apical pneumothorax, less than 5%.  There is dependent ground-glass and fine interstitial opacities. Changes are similar to CT 08/25/2013. No pleural effusion or interlobular septal thickening.  UPPER ABDOMEN:  Presumed cyst in the upper right liver, stable from previous imaging.  OSSEOUS:  No acute fracture.  No suspicious lytic or blastic lesions.  Critical Value/emergent results were called by telephone at the time of interpretation on 09/04/2013 at 5:34 PM to Dr[facility], who verbally acknowledged these results.  Review of the MIP images confirms the above findings.  IMPRESSION: 1. Pneumomediastinum and trace right apical pneumothorax (less than 5%). This is likely from pulmonic air leak given #2. 2. Unchanged  ground-glass opacities in the lower lungs, favoring an inflammatory (including drug reaction) pneumonitis or atypical infection. Given immunocompromised state (in the setting of lymphoma), consider PCP pneumonia - which can predispose to pneumothorax). 3. Negative for acute pulmonary embolism.   Electronically Signed   By: JJorje GuildM.D.   On: 09/04/2013 17:41   Nm Pet Image Restag (ps) Skull Base To Thigh  08/25/2013   CLINICAL DATA:  Subsequent treatment strategy for lymphoma.  EXAM: NUCLEAR MEDICINE PET SKULL BASE TO THIGH  TECHNIQUE: 7.3 mCi F-18 FDG was injected intravenously. Full-ring PET imaging was performed from the skull base to thigh after the radiotracer. CT data was obtained and used for attenuation correction and anatomic localization.  FASTING BLOOD GLUCOSE:  Value: 96 mg/dl  COMPARISON:  PET-CT scan 06/21/2013  FINDINGS: NECK  No hypermetabolic lymph nodes in the neck.  CHEST  There is new hypermetabolic activity associated with ground-glass opacities primarily in the lower lobes but also scattered within the upper lobes. No mass lesion associated with the these ground-glass hypermetabolic metastases.  The hypermetabolic activity extends into the inferior pleural surfaces.  There several small nodules in the upper lobes. For example 4 mm nodule (image 64 or, series 4)in the right upper lobe. The small nodules are unchanged from prior.  ABDOMEN/PELVIS  No abnormal metabolic activity within the liver or spleen. Spleen is normal volume. Interval resolution of the hypermetabolic activity within the right inguinal lymph nodes. The  lymph nodes have hypermetabolic activity less than mediastinum. No measurable enlarged lymph nodes remain.  SKELETON  No focal hypermetabolic activity to suggest skeletal metastasis.  IMPRESSION: 1. Interval resolution of hypermetabolic right inguinal lymph nodes ( [place] 1). 2. New extensive hypermetabolic ground-glass opacities predominantly in the lower lobes.  Burtis Junes this represent to be a drug reaction or other inflammatory or infectious process. Do not favor lymphoma recurrence.  Findings conveyed tocancer center triage nurse, [place] 08/25/2013 at11:17.   Electronically Signed   By: Suzy Bouchard M.D.   On: 08/25/2013 11:20   Dg Chest Port 1 View  09/05/2013   CLINICAL DATA:  Pneumothorax, short of breath, nausea  EXAM: PORTABLE CHEST - 1 VIEW  COMPARISON:  Prior chest x-ray and CT chest 09/04/2013  FINDINGS: Stable trace right apical pneumothorax. The mediastinal air is again a present and tracks into the soft tissues of the right greater than left neck. No significant interval exacerbation. Stable cardiac and mediastinal contours. Patient is status post median sternotomy with evidence of prior multivessel CABG including LIMA bypass. Similar to slightly increased left greater than right bibasilar patchy airspace opacities. No pleural effusion. No acute osseous abnormality.  IMPRESSION: 1. Stable pneumomediastinum in trace right apical pneumothorax. 2. Similar to slightly increased left greater than right bibasilar opacities. Differential considerations remain unchanged including pneumonitis and atypical infection.   Electronically Signed   By: Jacqulynn Cadet M.D.   On: 09/05/2013 08:18    Kelvin Cellar, MD  [facility] Pager:[phone]  If 7PM-7AM, please contact night-coverage [email or web address] Password [email or web address] 09/10/2013, 3:43 PM   LOS: 6 days   **Disclaimer: This note may have been dictated with voice recognition software. Similar sounding words can inadvertently be transcribed and this note may contain transcription errors which may not have been corrected upon publication of note.**

## 2013-09-10 NOTE — Progress Notes (Signed)
At 2030 patient's informed RN that patient is having nose bleed in the bil nostrils. Rt nostril stopped bleeding soon after applied pressure however, left nostril kept bleeding  for a hour.  Tried multiple interventions, pressure, ice, saline guaze.  MD on called informed and order received.  At 2215 it finally stopped.  Will inform pharmacy also, will continue monitor pt and instruct not to manipulate nose.

## 2013-09-10 NOTE — Progress Notes (Signed)
Clinical Social Work Department BRIEF PSYCHOSOCIAL ASSESSMENT 09/10/2013  Patient:  SIAOSI, ALTER     Account Number:  [id number]     Admit date:  09/04/2013  Clinical Social Worker:  Rolinda Roan  Date/Time:  09/10/2013 05:29 PM  Referred by:  Physician  Date Referred:  09/08/2013 Referred for  SNF Placement   Other Referral:   Interview type:  Patient Other interview type:    PSYCHOSOCIAL DATA Living Status:  WIFE Admitted from facility:   Level of care:   Primary support name:  Katsumi Wisler [phone] Primary support relationship to patient:  SPOUSE Degree of support available:   Good support at bedside.    CURRENT CONCERNS  Other Concerns:    SOCIAL WORK ASSESSMENT / PLAN Clinical Social Worker (CSW) met with patient and wife was at bedside. CSW introduced self and explained role of CSW department. Wife is agreeable to SNF placement as back up to inpatient rehab at [facility]. CSW gave wife current bed offers. Wife reported that they prefer to move to CIR for two days and then go home. CSW explained that CIR has to accept the patient and encouraged wife to consider SNF as a back up plan.   Assessment/plan status:  Psychosocial Support/Ongoing Assessment of Needs Other assessment/ plan:   Information/referral to community resources:   CSW gave wife SNF list with current bed offers.    PATIENT'S/FAMILY'S RESPONSE TO PLAN OF CARE: Wife thanked CSW for visit and assisting with placement process.

## 2013-09-11 LAB — CBC
HEMATOCRIT: 26.2 % — AB (ref 39.0–52.0)
Hemoglobin: 9.5 g/dL — ABNORMAL LOW (ref 13.0–17.0)
MCH: 35.8 pg — ABNORMAL HIGH (ref 26.0–34.0)
MCHC: 36.3 g/dL — AB (ref 30.0–36.0)
MCV: 98.9 fL (ref 78.0–100.0)
Platelets: 208 10*3/uL (ref 150–400)
RBC: 2.65 MIL/uL — ABNORMAL LOW (ref 4.22–5.81)
RDW: 17.5 % — AB (ref 11.5–15.5)
WBC: 20.6 10*3/uL — AB (ref 4.0–10.5)

## 2013-09-11 LAB — BASIC METABOLIC PANEL
BUN: 39 mg/dL — ABNORMAL HIGH (ref 6–23)
CO2: 24 mEq/L (ref 19–32)
CREATININE: 1.65 mg/dL — AB (ref 0.50–1.35)
Calcium: 9.1 mg/dL (ref 8.4–10.5)
Chloride: 90 mEq/L — ABNORMAL LOW (ref 96–112)
GFR calc Af Amer: 47 mL/min — ABNORMAL LOW (ref >90)
GFR, EST NON AFRICAN AMERICAN: 40 mL/min — AB (ref >90)
Glucose, Bld: 98 mg/dL (ref 70–99)
POTASSIUM: 4.4 meq/L (ref 3.7–5.3)
Sodium: 124 mEq/L — ABNORMAL LOW (ref 137–147)

## 2013-09-11 NOTE — Progress Notes (Signed)
I met with pt and his wife at bedside. They prefer an inpt rehab admission rather than SNF. [employer] is closed today. I will begin authorization for a possible admission tomorrow if approved. I will follow up in the morning. [phone]

## 2013-09-11 NOTE — Progress Notes (Signed)
Physical Therapy Treatment Patient Details Name: Willie [name] MRN: [id number] DOB: 08-Nov-1940 Today's Date: 09/11/2013    History of Present Illness pt presents with PNA/Pneumonitis related to chemo treatments.  pt being treated for Non-Hodgekins Lymphoma.      PT Comments    Pt progressing slowly with mobilization but continues to remain motivated. Pt able to progress mobility on 4L O2 today and single sitting rest break. Pt and wife educated on HEP to improve general LE strength. Pt continues to be great candidate for CIR to increase independence with mobility.   Follow Up Recommendations  CIR     Equipment Recommendations  Other (comment) (TBD; rollator ? )    Recommendations for Other Services OT consult;Rehab consult     Precautions / Restrictions Precautions Precautions: Fall Precaution Comments: Respiratory Sats Restrictions Weight Bearing Restrictions: No    Mobility  Bed Mobility               General bed mobility comments: not addressed; pt sitting in chair   Transfers Overall transfer level: Needs assistance Equipment used: Rolling walker (2 wheeled) Transfers: Sit to/from Stand Sit to Stand: Min guard;Min assist         General transfer comment: min guard to min (A); pt with posterior lean initially; requiring min (A) to maintain balance; cues for hand placement and sequencing; pt with difficulty recalling safe technique with RW  Ambulation/Gait Ambulation/Gait assistance: Min assist Ambulation Distance (Feet): 50 Feet (20 rest 30) Assistive device: Rolling walker (2 wheeled) Gait Pattern/deviations: Step-through pattern;Decreased stride length;Narrow base of support Gait velocity: decreased Gait velocity interpretation: Below normal speed for age/gender General Gait Details: pt required sitting rest break to progress mobility; pt ambulating on 3L of O2 initially; pt with SOB and labored breathing, bumped up to 4L O2 for second ambulation  trial and pt able to increase mobility; cues for upright posture and RW adjusted to proper height    Stairs            Wheelchair Mobility    Modified Rankin (Stroke Patients Only)       Balance Overall balance assessment: Needs assistance         Standing balance support: During functional activity;Bilateral upper extremity supported Standing balance-Leahy Scale: Poor Standing balance comment: requires UE support at all times                    Cognition Arousal/Alertness: Awake/alert Behavior During Therapy: WFL for tasks assessed/performed Overall Cognitive Status: Within Functional Limits for tasks assessed                      Exercises General Exercises - Lower Extremity Ankle Circles/Pumps: AROM;Both;15 reps Long Arc Quad: AROM;Strengthening;Both;10 reps;Seated Hip Flexion/Marching: AROM;Strengthening;Both;10 reps;Seated    General Comments General comments (skin integrity, edema, etc.): wife and pt educated on LE exercises for HEP  Pt educated on pursed lip breathing throughout session      Pertinent Vitals/Pain No  C/o pain    Home Living                      Prior Function            PT Goals (current goals can now be found in the care plan section) Acute Rehab PT Goals Patient Stated Goal: to walk as far as i can  PT Goal Formulation: With patient Time For Goal Achievement: 09/21/13 Potential to Achieve Goals: Good Progress towards  PT goals: Progressing toward goals    Frequency  Min 3X/week    PT Plan Current plan remains appropriate    Co-evaluation             End of Session Equipment Utilized During Treatment: Gait belt;Oxygen Activity Tolerance: Patient tolerated treatment well Patient left: in chair;with call bell/phone within reach;with family/visitor present     Time: 1314-3888 PT Time Calculation (min): 25 min  Charges:  $Gait Training: 8-22 mins $Therapeutic Exercise: 8-22 mins                     G CodesKennis Carina [place], [place] [phone] 09/11/2013, 9:22 AM

## 2013-09-11 NOTE — Progress Notes (Signed)
PATIENT DETAILS Name: Willie [name] Age: 73 y.o. Sex: male Date of Birth: 1940/08/20 Admit Date: 09/04/2013 Admitting Physician Delfina Redwood, MD IWL:NLGXQ,JJHERD DAVIDSON, MD  Subjective: Continues to work with physical therapy, showing slow improvement. Tolerating PO intake, remains afebrile.   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  -Case discussed with Dr Elsworth Soho, did not feel this reflected PCP, recommended stopping Vancomycin and bactrim  Hyponatremia -Sodium of 126-?SIADH from lung opacties -On 1200 mL fluid restriction -Sodium gradually improving, labs this morning showed sodium on 124, improved from 122  Severe Mitral Regurgitation -2-D echo showing moderate, holosystolicprolapse, involving the anterior leaflet. 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 He had an initial complete response to 6 cycles of CHOP chemotherapy. He relapsed October 2009 with extensive infiltration of tumor into the right iliopsoas muscle presenting as an erythematous nodular cutaneous mass in the right inguinal region. No other disease outside of the muscle noted on PET scan. He again achieved remission with ICE chemotherapy and  then went onto receive BCNU, etoposide and Cytoxan conditioning followed by autologous stem cell transplant at [facility] in January 2010. He was recently found to have a recurrence in the soft tissues of the right inguinal region. He began brentuximab on 06/28/2013."If above symptoms deemed to be drug related -may need to change regimen.  Hx of CAD-s/p CABG in 2007, and PCI in June 2014 -c/w ASA/Effient/Metoprolol and statins -SOB likely secondary to above, no chest pain.  Disposition: Possible d/c to CIR when stable  DVT Prophylaxis: Prophylactic Lovenox  Code Status: Full code   Family Communication Spouse at bedside  Procedures:  None  CONSULTS:  pulmonary/intensive care and CTVS  Time spent 25 min    MEDICATIONS: Scheduled Meds: . aspirin EC  81 mg Oral QPM  . atorvastatin  20 mg Oral Once per day on Sun Wed  . cholecalciferol  1,000 Units Oral Daily  . vitamin B-12  500 mcg Oral Daily  . docusate sodium  100 mg Oral BID  . enoxaparin (LOVENOX) injection  40 mg Subcutaneous Q24H  . metoprolol tartrate  25 mg Oral q morning - 10a  . multivitamin with minerals  1 tablet Oral Daily  . oxymetazoline  1 spray Each Nare BID  . prasugrel  10 mg Oral QPM  . predniSONE  60 mg Oral Q breakfast  . sodium chloride  3 mL Intravenous Q12H  . sodium chloride  1 g Oral TID WC  . valACYclovir  500 mg Oral Daily   Continuous Infusions:   PRN Meds:.sodium chloride, acetaminophen, acetaminophen, albuterol, alum & mag hydroxide-simeth, HYDROcodone-acetaminophen, nitroGLYCERIN, ondansetron (ZOFRAN) IV, ondansetron, senna-docusate, sodium chloride  Antibiotics: Anti-infectives   Start     Dose/Rate Route Frequency Ordered Stop   09/07/13 1000  sulfamethoxazole-trimethoprim (BACTRIM DS) 800-160 MG per tablet 1 tablet  Status:  Discontinued  1 tablet Oral Every 12 hours 09/07/13 0908 09/07/13 1422   09/06/13 2200  ceFEPIme (MAXIPIME) 1 g in dextrose 5 % 50 mL IVPB  Status:   Discontinued     1 g 100 mL/hr over 30 Minutes Intravenous Every 12 hours 09/06/13 1337 09/07/13 0908   09/05/13 1000  valACYclovir (VALTREX) tablet 500 mg     500 mg Oral Daily 09/04/13 2012     09/05/13 0800  vancomycin (VANCOCIN) 500 mg in sodium chloride 0.9 % 100 mL IVPB  Status:  Discontinued     500 mg 100 mL/hr over 60 Minutes Intravenous Every 12 hours 09/04/13 2032 09/07/13 0908   09/05/13 0200  ceFEPIme (MAXIPIME) 1 g in dextrose 5 % 50 mL IVPB  Status:  Discontinued     1 g 100 mL/hr over 30 Minutes Intravenous Every 8 hours 09/04/13 2012 09/06/13 1337   09/04/13 2100  sulfamethoxazole-trimethoprim (BACTRIM) 302.4 mg in dextrose 5 % 250 mL IVPB  Status:  Discontinued     15 mg/kg/day  60.5 kg 268.9 mL/hr over 60 Minutes Intravenous 3 times per day 09/04/13 2001 09/07/13 0908   09/04/13 1800  vancomycin (VANCOCIN) IVPB 1000 mg/200 mL premix     1,000 mg 200 mL/hr over 60 Minutes Intravenous  Once 09/04/13 1748 09/04/13 1952   09/04/13 1800  piperacillin-tazobactam (ZOSYN) IVPB 3.375 g     3.375 g 100 mL/hr over 30 Minutes Intravenous  Once 09/04/13 1748 09/04/13 1849       PHYSICAL EXAM: Vital signs in last 24 hours: Filed Vitals:   09/10/13 2230 09/11/13 0531 09/11/13 1003 09/11/13 1300  BP: 113/57 105/63 115/51 95/47  Pulse: 88 91 88 88  Temp: 97.3 F (36.3 C) 97.8 F (36.6 C)  97.5 F (36.4 C)  TempSrc: Oral Oral    Resp: $Remo'18 18  18  'cgida$ Height:      Weight:      SpO2: 100% 100%  95%    Weight change:  Filed Weights   09/06/13 0656 09/07/13 0653 09/10/13 0632  Weight: 57.97 kg (127 lb 12.8 oz) 60.056 kg (132 lb 6.4 oz) 60.782 kg (134 lb)   Body mass index is 19.78 kg/(m^2).   Gen Exam: Awake and alert with clear speech, ill-appearing   Neck: Supple, No JVD.   Chest: Clear to auscultation, remains of supplemental oxygen CVS: S1 S2 Regular, no murmurs.  Abdomen: soft, BS +, non tender, non distended.  Extremities: no edema, lower extremities warm to  touch. Neurologic: Non Focal.   Skin: No Rash.   Wounds: N/A.   Intake/Output from previous day:  Intake/Output Summary (Last 24 hours) at 09/11/13 1424 Last data filed at 09/11/13 1300  Gross per 24 hour  Intake    480 ml  Output      0 ml  Net    480 ml     LAB RESULTS: CBC  Recent Labs Lab 09/04/13 1610  09/07/13 0835 09/08/13 0720 09/09/13 0400 09/10/13 0618 09/11/13 0506  WBC 14.5*  < > 16.8* 17.6* 16.5* 19.1* 20.6*  HGB 11.1*  < > 10.8* 11.3* 10.3* 10.4* 9.5*  HCT 31.3*  < > 31.0* 32.3* 29.3* 30.0* 26.2*  PLT 259  < > 243 237 194 233 208  MCV 97.2  < > 100.0 99.4 100.0 100.7* 98.9  MCH 34.5*  < > 34.8* 34.8* 35.2* 34.9* 35.8*  MCHC 35.5  < > 34.8 35.0 35.2 34.7 36.3*  RDW 18.5*  < > 18.5* 18.0* 17.8*  17.8* 17.5*  LYMPHSABS 0.4*  --   --   --   --   --   --   MONOABS 0.2  --   --   --   --   --   --   EOSABS 0.0  --   --   --   --   --   --   BASOSABS 0.0  --   --   --   --   --   --   < > = values in this interval not displayed.  Chemistries   Recent Labs Lab 09/07/13 0835 09/08/13 0720 09/09/13 0400 09/10/13 0618 09/11/13 0506  NA 126* 124* 122* 123* 124*  K 4.4 4.1 3.9 4.4 4.4  CL 88* 87* 86* 86* 90*  CO2 $Re'22 26 24 22 24  'McI$ GLUCOSE 178* 84 100* 117* 98  BUN 25* 30* 32* 31* 39*  CREATININE 1.51* 1.58* 1.46* 1.52* 1.65*  CALCIUM 8.6 8.9 8.7 9.4 9.1    CBG:  Recent Labs Lab 09/05/13 1627 09/06/13 0638 09/07/13 0643  GLUCAP 175* 154* 138*    GFR Estimated Creatinine Clearance: 35.3 ml/min (by C-G formula based on Cr of 1.65).  Coagulation profile No results found for this basename: INR, PROTIME,  in the last 168 hours  Cardiac Enzymes No results found for this basename: CK, CKMB, TROPONINI, MYOGLOBIN,  in the last 168 hours  No components found with this basename: POCBNP,  No results found for this basename: DDIMER,  in the last 72 hours No results found for this basename: HGBA1C,  in the last 72 hours No results found for this  basename: CHOL, HDL, LDLCALC, TRIG, CHOLHDL, LDLDIRECT,  in the last 72 hours No results found for this basename: TSH, T4TOTAL, FREET3, T3FREE, THYROIDAB,  in the last 72 hours No results found for this basename: VITAMINB12, FOLATE, FERRITIN, TIBC, IRON, RETICCTPCT,  in the last 72 hours No results found for this basename: LIPASE, AMYLASE,  in the last 72 hours  Urine Studies No results found for this basename: UACOL, UAPR, USPG, UPH, UTP, UGL, UKET, UBIL, UHGB, UNIT, UROB, ULEU, UEPI, UWBC, URBC, UBAC, CAST, CRYS, UCOM, BILUA,  in the last 72 hours  MICROBIOLOGY: Recent Results (from the past 240 hour(s))  CULTURE, BLOOD (ROUTINE X 2)     Status: None   Collection Time    09/04/13  4:15 PM      Result Value Ref Range Status   Specimen Description BLOOD RIGHT FOREARM   Final   Special Requests BOTTLES DRAWN AEROBIC AND ANAEROBIC 5ML   Final   Culture  Setup Time     Final   Value: 09/04/2013 22:54     Performed at [facility]   Culture     Final   Value: NO GROWTH 5 DAYS     Performed at [facility]   Report Status 09/10/2013 FINAL   Final  CULTURE, BLOOD (ROUTINE X 2)     Status: None   Collection Time    09/04/13  6:11 PM      Result Value Ref Range Status   Specimen Description BLOOD LEFT ARM   Final   Special Requests BOTTLES DRAWN AEROBIC AND ANAEROBIC 4ML   Final   Culture  Setup Time     Final   Value: 09/04/2013 22:55     Performed at [facility]   Culture     Final   Value: NO GROWTH 5 DAYS  Performed at [facility]   Report Status 09/10/2013 FINAL   Final    RADIOLOGY STUDIES/RESULTS: Dg Chest 2 View  09/04/2013   CLINICAL DATA:  Shortness of breath.  EXAM: CHEST  2 VIEW  COMPARISON:  June 17, 2013.  FINDINGS: Stable cardiomediastinal silhouette. Status post coronary artery bypass graft. No pneumothorax or significant pleural effusion is noted. Interval development of bilateral perihilar and basilar interstitial densities  are noted concerning for pulmonary edema.  IMPRESSION: Interval development of bilateral perihilar and basilar interstitial densities concerning for pulmonary edema or less likely pneumonia.   Electronically Signed   By: Sabino Dick M.D.   On: 09/04/2013 16:01   Ct Angio Chest Pe W/cm &/or Wo Cm  09/04/2013   CLINICAL DATA:  Shortness of breath, tachycardia, hypoxia. History of DVT.  EXAM: CT ANGIOGRAPHY CHEST WITH CONTRAST  TECHNIQUE: Multidetector CT imaging of the chest was performed using the standard protocol during bolus administration of intravenous contrast. Multiplanar CT image reconstructions and MIPs were obtained to evaluate the vascular anatomy.  CONTRAST:  155mL OMNIPAQUE IOHEXOL 350 MG/ML SOLN  COMPARISON:  05/12/2013  FINDINGS: THORACIC INLET/BODY WALL:  Emphysema.  MEDIASTINUM:  Large volume of pneumomediastinum, extending into the neck.  No cardiomegaly. No pericardial effusion. Extensive atherosclerosis, including the coronary arteries. Status post CABG with LIMA and upper venous graft widely patent. The graft associated with the lower ostium marker is not clearly visible, presumably chronic given history. There is occasional respiratory motion, which decreases sensitivity at the affected levels. Overall, exam is diagnostic. There is no evidence of acute pulmonary embolism. No evidence of esophageal thickening. No fluid collection. No adenopathy.  LUNG WINDOWS:  Small right apical pneumothorax, less than 5%.  There is dependent ground-glass and fine interstitial opacities. Changes are similar to CT 08/25/2013. No pleural effusion or interlobular septal thickening.  UPPER ABDOMEN:  Presumed cyst in the upper right liver, stable from previous imaging.  OSSEOUS:  No acute fracture.  No suspicious lytic or blastic lesions.  Critical Value/emergent results were called by telephone at the time of interpretation on 09/04/2013 at 5:34 PM to Dr. Jinny Blossom, [facility] , who verbally acknowledged these results.   Review of the MIP images confirms the above findings.  IMPRESSION: 1. Pneumomediastinum and trace right apical pneumothorax (less than 5%). This is likely from pulmonic air leak given #2. 2. Unchanged ground-glass opacities in the lower lungs, favoring an inflammatory (including drug reaction) pneumonitis or atypical infection. Given immunocompromised state (in the setting of lymphoma), consider PCP pneumonia - which can predispose to pneumothorax). 3. Negative for acute pulmonary embolism.   Electronically Signed   By: Jorje Guild M.D.   On: 09/04/2013 17:41   Nm Pet Image Restag (ps) Skull Base To Thigh  08/25/2013   CLINICAL DATA:  Subsequent treatment strategy for lymphoma.  EXAM: NUCLEAR MEDICINE PET SKULL BASE TO THIGH  TECHNIQUE: 7.3 mCi F-18 FDG was injected intravenously. Full-ring PET imaging was performed from the skull base to thigh after the radiotracer. CT data was obtained and used for attenuation correction and anatomic localization.  FASTING BLOOD GLUCOSE:  Value: 96 mg/dl  COMPARISON:  PET-CT scan 06/21/2013  FINDINGS: NECK  No hypermetabolic lymph nodes in the neck.  CHEST  There is new hypermetabolic activity associated with ground-glass opacities primarily in the lower lobes but also scattered within the upper lobes. No mass lesion associated with the these ground-glass hypermetabolic metastases.  The hypermetabolic activity extends into the inferior pleural surfaces.  There  several small nodules in the upper lobes. For example 4 mm nodule (image 64 or, series 4)in the right upper lobe. The small nodules are unchanged from prior.  ABDOMEN/PELVIS  No abnormal metabolic activity within the liver or spleen. Spleen is normal volume. Interval resolution of the hypermetabolic activity within the right inguinal lymph nodes. The lymph nodes have hypermetabolic activity less than mediastinum. No measurable enlarged lymph nodes remain.  SKELETON  No focal hypermetabolic activity to suggest skeletal  metastasis.  IMPRESSION: 1. Interval resolution of hypermetabolic right inguinal lymph nodes ( [place] 1). 2. New extensive hypermetabolic ground-glass opacities predominantly in the lower lobes. Burtis Junes this represent to be a drug reaction or other inflammatory or infectious process. Do not favor lymphoma recurrence.  Findings conveyed tocancer center triage nurse, [place] 08/25/2013 at11:17.   Electronically Signed   By: Suzy Bouchard M.D.   On: 08/25/2013 11:20   Dg Chest Port 1 View  09/05/2013   CLINICAL DATA:  Pneumothorax, short of breath, nausea  EXAM: PORTABLE CHEST - 1 VIEW  COMPARISON:  Prior chest x-ray and CT chest 09/04/2013  FINDINGS: Stable trace right apical pneumothorax. The mediastinal air is again a present and tracks into the soft tissues of the right greater than left neck. No significant interval exacerbation. Stable cardiac and mediastinal contours. Patient is status post median sternotomy with evidence of prior multivessel CABG including LIMA bypass. Similar to slightly increased left greater than right bibasilar patchy airspace opacities. No pleural effusion. No acute osseous abnormality.  IMPRESSION: 1. Stable pneumomediastinum in trace right apical pneumothorax. 2. Similar to slightly increased left greater than right bibasilar opacities. Differential considerations remain unchanged including pneumonitis and atypical infection.   Electronically Signed   By: Jacqulynn Cadet M.D.   On: 09/05/2013 08:18    Kelvin Cellar, MD  [facility] Pager:[phone]  If 7PM-7AM, please contact night-coverage [email or web address] Password [email or web address] 09/11/2013, 2:24 PM   LOS: 7 days   **Disclaimer: This note may have been dictated with voice recognition software. Similar sounding words can inadvertently be transcribed and this note may contain transcription errors which may not have been corrected upon publication of note.**

## 2013-09-12 ENCOUNTER — Inpatient Hospital Stay (HOSPITAL_COMMUNITY)
Admission: RE | Admit: 2013-09-12 | Discharge: 2013-09-20 | DRG: 945 | Disposition: A | Discharge status: Other Acute Inpatient Hospital | Payer: Medicare Other | Source: Intra-hospital | Attending: Physical Medicine & Rehabilitation | Admitting: Physical Medicine & Rehabilitation

## 2013-09-12 ENCOUNTER — Telehealth: Payer: Self-pay | Admitting: *Deleted

## 2013-09-12 DIAGNOSIS — J9600 Acute respiratory failure, unspecified whether with hypoxia or hypercapnia: Secondary | ICD-10-CM

## 2013-09-12 DIAGNOSIS — C8589 Other specified types of non-Hodgkin lymphoma, extranodal and solid organ sites: Secondary | ICD-10-CM | POA: Diagnosis present

## 2013-09-12 DIAGNOSIS — Z951 Presence of aortocoronary bypass graft: Secondary | ICD-10-CM

## 2013-09-12 DIAGNOSIS — I129 Hypertensive chronic kidney disease with stage 1 through stage 4 chronic kidney disease, or unspecified chronic kidney disease: Secondary | ICD-10-CM | POA: Diagnosis present

## 2013-09-12 DIAGNOSIS — R5381 Other malaise: Secondary | ICD-10-CM | POA: Diagnosis present

## 2013-09-12 DIAGNOSIS — E785 Hyperlipidemia, unspecified: Secondary | ICD-10-CM | POA: Diagnosis present

## 2013-09-12 DIAGNOSIS — I4892 Unspecified atrial flutter: Secondary | ICD-10-CM

## 2013-09-12 DIAGNOSIS — D500 Iron deficiency anemia secondary to blood loss (chronic): Secondary | ICD-10-CM | POA: Diagnosis present

## 2013-09-12 DIAGNOSIS — J42 Unspecified chronic bronchitis: Secondary | ICD-10-CM

## 2013-09-12 DIAGNOSIS — IMO0002 Reserved for concepts with insufficient information to code with codable children: Secondary | ICD-10-CM | POA: Diagnosis not present

## 2013-09-12 DIAGNOSIS — Z7982 Long term (current) use of aspirin: Secondary | ICD-10-CM

## 2013-09-12 DIAGNOSIS — Z8249 Family history of ischemic heart disease and other diseases of the circulatory system: Secondary | ICD-10-CM | POA: Diagnosis not present

## 2013-09-12 DIAGNOSIS — J189 Pneumonia, unspecified organism: Secondary | ICD-10-CM | POA: Diagnosis present

## 2013-09-12 DIAGNOSIS — I059 Rheumatic mitral valve disease, unspecified: Secondary | ICD-10-CM | POA: Diagnosis present

## 2013-09-12 DIAGNOSIS — Z9849 Cataract extraction status, unspecified eye: Secondary | ICD-10-CM | POA: Diagnosis not present

## 2013-09-12 DIAGNOSIS — I509 Heart failure, unspecified: Secondary | ICD-10-CM | POA: Diagnosis present

## 2013-09-12 DIAGNOSIS — E873 Alkalosis: Secondary | ICD-10-CM | POA: Diagnosis not present

## 2013-09-12 DIAGNOSIS — I10 Essential (primary) hypertension: Secondary | ICD-10-CM

## 2013-09-12 DIAGNOSIS — I5033 Acute on chronic diastolic (congestive) heart failure: Secondary | ICD-10-CM | POA: Diagnosis not present

## 2013-09-12 DIAGNOSIS — E871 Hypo-osmolality and hyponatremia: Secondary | ICD-10-CM | POA: Diagnosis not present

## 2013-09-12 DIAGNOSIS — Z9861 Coronary angioplasty status: Secondary | ICD-10-CM | POA: Diagnosis not present

## 2013-09-12 DIAGNOSIS — E876 Hypokalemia: Secondary | ICD-10-CM | POA: Diagnosis not present

## 2013-09-12 DIAGNOSIS — Z806 Family history of leukemia: Secondary | ICD-10-CM

## 2013-09-12 DIAGNOSIS — R310 Gross hematuria: Secondary | ICD-10-CM | POA: Diagnosis not present

## 2013-09-12 DIAGNOSIS — C8445 Peripheral T-cell lymphoma, not classified, lymph nodes of inguinal region and lower limb: Secondary | ICD-10-CM

## 2013-09-12 DIAGNOSIS — Z87891 Personal history of nicotine dependence: Secondary | ICD-10-CM

## 2013-09-12 DIAGNOSIS — N183 Chronic kidney disease, stage 3 unspecified: Secondary | ICD-10-CM | POA: Diagnosis present

## 2013-09-12 DIAGNOSIS — Z86718 Personal history of other venous thrombosis and embolism: Secondary | ICD-10-CM

## 2013-09-12 DIAGNOSIS — Z9481 Bone marrow transplant status: Secondary | ICD-10-CM | POA: Diagnosis not present

## 2013-09-12 DIAGNOSIS — Z5189 Encounter for other specified aftercare: Principal | ICD-10-CM

## 2013-09-12 DIAGNOSIS — K5900 Constipation, unspecified: Secondary | ICD-10-CM | POA: Diagnosis not present

## 2013-09-12 DIAGNOSIS — I2510 Atherosclerotic heart disease of native coronary artery without angina pectoris: Secondary | ICD-10-CM

## 2013-09-12 DIAGNOSIS — Z961 Presence of intraocular lens: Secondary | ICD-10-CM | POA: Diagnosis not present

## 2013-09-12 DIAGNOSIS — R634 Abnormal weight loss: Secondary | ICD-10-CM

## 2013-09-12 LAB — BASIC METABOLIC PANEL
BUN: 44 mg/dL — ABNORMAL HIGH (ref 6–23)
CALCIUM: 10.3 mg/dL (ref 8.4–10.5)
CO2: 25 meq/L (ref 19–32)
Chloride: 87 mEq/L — ABNORMAL LOW (ref 96–112)
Creatinine, Ser: 1.7 mg/dL — ABNORMAL HIGH (ref 0.50–1.35)
GFR calc Af Amer: 45 mL/min — ABNORMAL LOW (ref >90)
GFR calc non Af Amer: 39 mL/min — ABNORMAL LOW (ref >90)
GLUCOSE: 109 mg/dL — AB (ref 70–99)
POTASSIUM: 4.8 meq/L (ref 3.7–5.3)
SODIUM: 124 meq/L — AB (ref 137–147)

## 2013-09-12 LAB — CBC
HCT: 25.3 % — ABNORMAL LOW (ref 39.0–52.0)
HEMOGLOBIN: 9 g/dL — AB (ref 13.0–17.0)
MCH: 35.6 pg — AB (ref 26.0–34.0)
MCHC: 35.6 g/dL (ref 30.0–36.0)
MCV: 100 fL (ref 78.0–100.0)
Platelets: 220 10*3/uL (ref 150–400)
RBC: 2.53 MIL/uL — ABNORMAL LOW (ref 4.22–5.81)
RDW: 17.5 % — ABNORMAL HIGH (ref 11.5–15.5)
WBC: 16.6 10*3/uL — AB (ref 4.0–10.5)

## 2013-09-12 LAB — CREATININE, SERUM
CREATININE: 2.05 mg/dL — AB (ref 0.50–1.35)
GFR calc non Af Amer: 31 mL/min — ABNORMAL LOW (ref >90)
GFR, EST AFRICAN AMERICAN: 36 mL/min — AB (ref >90)

## 2013-09-12 MED ORDER — ASPIRIN EC 81 MG PO TBEC
81.0000 mg | DELAYED_RELEASE_TABLET | Freq: Every evening | ORAL | Status: DC
  Administered 2013-09-13 – 2013-09-19 (×6): 81 mg via ORAL
  Filled 2013-09-12 (×8): qty 1

## 2013-09-12 MED ORDER — ENOXAPARIN SODIUM 40 MG/0.4ML ~~LOC~~ SOLN
40.0000 mg | SUBCUTANEOUS | Status: DC
  Administered 2013-09-12 – 2013-09-14 (×3): 40 mg via SUBCUTANEOUS
  Filled 2013-09-12 (×4): qty 0.4

## 2013-09-12 MED ORDER — VITAMIN D3 25 MCG (1000 UNIT) PO TABS
1000.0000 [IU] | ORAL_TABLET | Freq: Every day | ORAL | Status: DC
  Administered 2013-09-13 – 2013-09-20 (×8): 1000 [IU] via ORAL
  Filled 2013-09-12 (×10): qty 1

## 2013-09-12 MED ORDER — ONDANSETRON HCL 4 MG PO TABS: 4.0000 mg | ORAL_TABLET | Freq: Four times a day (QID) | ORAL | Status: DC | PRN

## 2013-09-12 MED ORDER — METOPROLOL TARTRATE 25 MG PO TABS
25.0000 mg | ORAL_TABLET | Freq: Every morning | ORAL | Status: DC
  Administered 2013-09-13 – 2013-09-20 (×8): 25 mg via ORAL
  Filled 2013-09-12 (×8): qty 1

## 2013-09-12 MED ORDER — SORBITOL 70 % SOLN
30.0000 mL | Freq: Every day | Status: DC | PRN
  Administered 2013-09-19: 30 mL via ORAL
  Filled 2013-09-12: qty 30

## 2013-09-12 MED ORDER — HYDROCODONE-ACETAMINOPHEN 5-325 MG PO TABS: 1.0000 | ORAL_TABLET | ORAL | Status: DC | PRN

## 2013-09-12 MED ORDER — DOCUSATE SODIUM 100 MG PO CAPS
100.0000 mg | ORAL_CAPSULE | Freq: Two times a day (BID) | ORAL | Status: DC
  Administered 2013-09-12 – 2013-09-20 (×15): 100 mg via ORAL
  Filled 2013-09-12 (×19): qty 1

## 2013-09-12 MED ORDER — ADULT MULTIVITAMIN W/MINERALS CH
1.0000 | ORAL_TABLET | Freq: Every day | ORAL | Status: DC
  Administered 2013-09-13 – 2013-09-20 (×8): 1 via ORAL
  Filled 2013-09-12 (×10): qty 1

## 2013-09-12 MED ORDER — CYANOCOBALAMIN 500 MCG PO TABS
500.0000 ug | ORAL_TABLET | Freq: Every day | ORAL | Status: DC
  Administered 2013-09-13 – 2013-09-16 (×4): 500 ug via ORAL
  Administered 2013-09-17: 10:00:00 via ORAL
  Administered 2013-09-18 – 2013-09-20 (×3): 500 ug via ORAL
  Filled 2013-09-12 (×10): qty 1

## 2013-09-12 MED ORDER — SENNOSIDES-DOCUSATE SODIUM 8.6-50 MG PO TABS
1.0000 | ORAL_TABLET | Freq: Every evening | ORAL | Status: DC | PRN
  Administered 2013-09-18: 1 via ORAL
  Filled 2013-09-12: qty 1

## 2013-09-12 MED ORDER — NITROGLYCERIN 0.4 MG SL SUBL: 0.4000 mg | SUBLINGUAL_TABLET | SUBLINGUAL | Status: DC | PRN

## 2013-09-12 MED ORDER — DSS 100 MG PO CAPS: 100.0000 mg | ORAL_CAPSULE | Freq: Two times a day (BID) | ORAL | Status: DC

## 2013-09-12 MED ORDER — OXYMETAZOLINE HCL 0.05 % NA SOLN
1.0000 | Freq: Two times a day (BID) | NASAL | Status: DC
  Administered 2013-09-13 – 2013-09-19 (×9): 1 via NASAL
  Filled 2013-09-12: qty 15

## 2013-09-12 MED ORDER — ENOXAPARIN SODIUM 40 MG/0.4ML ~~LOC~~ SOLN: 40.0000 mg | SUBCUTANEOUS | Status: DC

## 2013-09-12 MED ORDER — ALBUTEROL SULFATE (2.5 MG/3ML) 0.083% IN NEBU: 2.5000 mg | INHALATION_SOLUTION | RESPIRATORY_TRACT | Status: AC | PRN

## 2013-09-12 MED ORDER — VALACYCLOVIR HCL 500 MG PO TABS
500.0000 mg | ORAL_TABLET | Freq: Every day | ORAL | Status: DC
  Administered 2013-09-13 – 2013-09-20 (×8): 500 mg via ORAL
  Filled 2013-09-12 (×10): qty 1

## 2013-09-12 MED ORDER — PREDNISONE 50 MG PO TABS
60.0000 mg | ORAL_TABLET | Freq: Every day | ORAL | Status: DC
  Administered 2013-09-13 – 2013-09-15 (×3): 60 mg via ORAL
  Filled 2013-09-12 (×4): qty 1

## 2013-09-12 MED ORDER — ONDANSETRON HCL 4 MG/2ML IJ SOLN: 4.0000 mg | Freq: Four times a day (QID) | INTRAMUSCULAR | Status: DC | PRN

## 2013-09-12 MED ORDER — SODIUM CHLORIDE 1 G PO TABS
1.0000 g | ORAL_TABLET | Freq: Three times a day (TID) | ORAL | Status: DC
  Administered 2013-09-13 – 2013-09-20 (×21): 1 g via ORAL
  Filled 2013-09-12 (×26): qty 1

## 2013-09-12 MED ORDER — PRASUGREL HCL 10 MG PO TABS
10.0000 mg | ORAL_TABLET | Freq: Every evening | ORAL | Status: DC
  Administered 2013-09-13 – 2013-09-19 (×6): 10 mg via ORAL
  Filled 2013-09-12 (×8): qty 1

## 2013-09-12 MED ORDER — SENNOSIDES-DOCUSATE SODIUM 8.6-50 MG PO TABS: 1.0000 | ORAL_TABLET | Freq: Every evening | ORAL | Status: AC | PRN

## 2013-09-12 MED ORDER — ALUM & MAG HYDROXIDE-SIMETH 200-200-20 MG/5ML PO SUSP: 30.0000 mL | Freq: Four times a day (QID) | ORAL | Status: DC | PRN

## 2013-09-12 MED ORDER — ACETAMINOPHEN 325 MG PO TABS: 325.0000 mg | ORAL_TABLET | ORAL | Status: DC | PRN

## 2013-09-12 MED ORDER — ALBUTEROL SULFATE (2.5 MG/3ML) 0.083% IN NEBU: 2.5000 mg | INHALATION_SOLUTION | RESPIRATORY_TRACT | Status: DC | PRN

## 2013-09-12 MED ORDER — ATORVASTATIN CALCIUM 20 MG PO TABS
20.0000 mg | ORAL_TABLET | ORAL | Status: DC
  Administered 2013-09-13 – 2013-09-17 (×2): 20 mg via ORAL
  Filled 2013-09-12 (×3): qty 1

## 2013-09-12 NOTE — Telephone Encounter (Signed)
Wife left VM to inform Dr. Alvy Bimler pt is still in hospital.  He is being transferred to 4 th floor today and will need rehab for a few days.  She thinks we probably need to cancel his appt here on 5/29 and she will call us to let us know when he is d/c'd from the hospital.

## 2013-09-12 NOTE — Interval H&P Note (Signed)
Willie [name] was admitted today to Inpatient Rehabilitation with the diagnosis of deconditioning.  The patient's history has been reviewed, patient examined, and there is no change in status.  Patient continues to be appropriate for intensive inpatient rehabilitation.  I have reviewed the patient's chart and labs.  Questions were answered to the patient's satisfaction.  Meredith Staggers 09/12/2013, 5:57 PM

## 2013-09-12 NOTE — PMR Pre-admission (Signed)
PMR Admission Coordinator Pre-Admission Assessment  Patient: Willie [name] is an 73 y.o., male MRN: [id number] DOB: Aug 18, 1940 Height: $[id number]'5\' [id number]"'[id number]$  (175.3 cm) Weight: 60.782 kg (134 lb)              Insurance Information HMO: yes    PPO:      PCP:      IPA:      80/20:      OTHER: Medicare advantage plan PRIMARY: Blue Medicare      Policy#: [id number]      Subscriber: pt CM Name: Dallas Breeding      Phone#: [phone]     Fax#: [phone] approved for 7 days. Update on 04/27/54 Pre-Cert#: [id number]      Employer: retired Benefits:  Phone #: [phone]     Name: 09/12/13 Eff. Date: 04/20/13     Deduct: none      Out of Pocket Max: $4500/met      Life Max: none CIR: $250 copay per day, days 1-6      SNF: no copay days 1-20; $60 copay days 21-100 Outpatient: $35 per visit     Co-Pay: no visit limit Home Health: 100%      Co-Pay: none DME: 80%     Co-Pay: 20% Providers: in network  SECONDARY: none        Medicaid Application Date:       Case Manager:  Disability Application Date:       Case Worker:   Emergency Facilities manager Information   Name Relation Home Work [place] Spouse [phone]  [phone]   Lenoard Aden Daughter [phone]     [place], [place]  [phone]       Current Medical History  Patient Admitting Diagnosis: Deconditioning after pneumonia in a patient with non-Hodgkin's lymphoma  History of Present Illness: Willie [name] is a 73 y.o. 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 No chest pain. Recent PET scan showed findings consistent with pneumonia. Patient developed low-grade fever and cough. Chest x-ray showed interval development of bilateral perihilar and basilar interstitial densities concerning for pulmonary edema or less likely pneumonia. CT angio of the chest showed no pulmonary emboli. Maintained on broad-spectrum antibiotics. Follow up hematology  services in reference to non-Hodgkin's trauma. Findings of small right apical PTX and monitored as well as pneumomediastinum. Follow cardiothoracic surgery in reference to pneumomediastinum with Gastrografin swallow completed showing no evidence of leak. No surgical therapy needed. Echocardiogram with ejection fraction of 60% no wall motion abnormalities. Patient placed on Lovenox for DVT prophylaxis. Hyponatremia 126 and monitored. Physical therapy evaluation 09/07/2013 for deconditioning noted patient tends to desaturate with activity. Recommendations made for physical medicine rehabilitation consult  Patient has been more confused since hospitalization according to his son.   Past Medical History  Past Medical History  Diagnosis Date  . Hypertension   . History of DVT of lower extremity     06/2001 as first sign of lymphoma: enlarged left inguinal nodes  . Low serum testosterone level 11/10/2011  . 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 Nocturia 11/10/2011  . Normochromic anemia 11/10/2011    Likely result of prior high dose chemotherapy  . Unexplained weight loss 05/03/2012  . Mitral valvular regurgitation 06/01/12    moderate to severe by echo- asymptomatic  . Dyslipidemia   . RBBB   . Coronary artery disease   . Family history of anesthesia complication     "son,  Kegan, larynx collapses" (10/11/2012)  . Heart murmur   . History of blood transfusion 2009-2010    "both related to his chemo" (10/11/2012)  . Chronic renal insufficiency, stage III (moderate)   . Non Hodgkin's lymphoma 2003; 2009    "chemo; stem cell transplant & high powered chemo" (10/10/2012)  . Renal insufficiency   . Deep inguinal pain, right 05/26/2013  . Conjunctivitis, acute, right eye 06/28/2013    Family History  family history includes Cancer - Lung in his mother; Heart failure in his father; Leukemia (age of onset: 6) in his brother.  Prior Rehab/Hospitalizations: none  Current Medications  Current  facility-administered medications:0.9 %  sodium chloride infusion, 250 mL, Intravenous, PRN, Delfina Redwood, MD;  acetaminophen (TYLENOL) suppository 650 mg, 650 mg, Rectal, Q6H PRN, Delfina Redwood, MD;  acetaminophen (TYLENOL) tablet 650 mg, 650 mg, Oral, Q6H PRN, Delfina Redwood, MD, 650 mg at 09/11/13 0031 albuterol (PROVENTIL) (2.5 MG/3ML) 0.083% nebulizer solution 2.5 mg, 2.5 mg, Nebulization, Q2H PRN, Delfina Redwood, MD;  alum & mag hydroxide-simeth (MAALOX/MYLANTA) 200-200-20 MG/5ML suspension 30 mL, 30 mL, Oral, Q6H PRN, Delfina Redwood, MD;  aspirin EC tablet 81 mg, 81 mg, Oral, QPM, Delfina Redwood, MD, 81 mg at 09/11/13 1725 atorvastatin (LIPITOR) tablet 20 mg, 20 mg, Oral, Once per day on Sun Wed, Corinna L Sullivan, MD, 20 mg at 09/10/13 1701;  cholecalciferol (VITAMIN D) tablet 1,000 Units, 1,000 Units, Oral, Daily, Delfina Redwood, MD, 1,000 Units at 09/12/13 1020;  cyanocobalamin tablet 500 mcg, 500 mcg, Oral, Daily, Delfina Redwood, MD, 500 mcg at 09/12/13 1020 docusate sodium (COLACE) capsule 100 mg, 100 mg, Oral, BID, Kelvin Cellar, MD, 100 mg at 09/12/13 1019;  enoxaparin (LOVENOX) injection 40 mg, 40 mg, Subcutaneous, Q24H, Delfina Redwood, MD, 40 mg at 09/11/13 2150;  HYDROcodone-acetaminophen (NORCO/VICODIN) 5-325 MG per tablet 1-2 tablet, 1-2 tablet, Oral, Q4H PRN, Delfina Redwood, MD metoprolol tartrate (LOPRESSOR) tablet 25 mg, 25 mg, Oral, q morning - 10a, Delfina Redwood, MD, 25 mg at 09/12/13 1019;  multivitamin with minerals tablet 1 tablet, 1 tablet, Oral, Daily, Delfina Redwood, MD, 1 tablet at 09/12/13 1019;  nitroGLYCERIN (NITROSTAT) SL tablet 0.4 mg, 0.4 mg, Sublingual, Q5 min PRN, Delfina Redwood, MD ondansetron [facility]) injection 4 mg, 4 mg, Intravenous, Q6H PRN, Delfina Redwood, MD, 4 mg at 09/10/13 0827;  ondansetron (ZOFRAN) tablet 4 mg, 4 mg, Oral, Q6H PRN, Delfina Redwood, MD;  oxymetazoline (AFRIN) 0.05 % nasal spray 1  spray, 1 spray, Each Nare, BID, Rhetta Mura Schorr, NP, 1 spray at 09/12/13 1019;  prasugrel (EFFIENT) tablet 10 mg, 10 mg, Oral, QPM, Delfina Redwood, MD, 10 mg at 09/11/13 1725 predniSONE (DELTASONE) tablet 60 mg, 60 mg, Oral, Q breakfast, Kelvin Cellar, MD, 60 mg at 09/12/13 0981;  senna-docusate (Senokot-S) tablet 1 tablet, 1 tablet, Oral, QHS PRN, Delfina Redwood, MD;  sodium chloride 0.9 % injection 3 mL, 3 mL, Intravenous, Q12H, Delfina Redwood, MD, 3 mL at 09/11/13 1005;  sodium chloride 0.9 % injection 3 mL, 3 mL, Intravenous, PRN, Delfina Redwood, MD sodium chloride tablet 1 g, 1 g, Oral, TID WC, Kelvin Cellar, MD, 1 g at 09/11/13 1725;  valACYclovir (VALTREX) tablet 500 mg, 500 mg, Oral, Daily, Delfina Redwood, MD, 500 mg at 09/12/13 1020  Patients Current Diet: Cardiac  Precautions / Restrictions Precautions Precautions: Fall Precaution Comments: Respiratory Sats Restrictions Weight Bearing Restrictions: No   Prior  Activity Level Limited Community (1-2x/wk): working fulltimeuntil february when began chemo. Was completely independent up until one week pta. 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, international aid / [place] Devices/Equipment: Eyeglasses  Prior Functional Level Prior Function Level of Independence: Independent  Current Functional Level Cognition  Overall Cognitive Status: Within Functional Limits for tasks assessed    Extremity Assessment (includes Sensation/Coordination)          ADLs  Overall ADL's : Needs assistance/impaired Eating/Feeding: Sitting;Supervision/ safety Grooming: Applying deodorant;Wash/dry face;Standing;Sitting;Min guard;Oral care (simulated oral care) Upper Body Bathing: Sitting;Set up;Supervision/ safety Lower Body Bathing: Sit to/from stand;Minimal assistance Upper Body Dressing : Set up;Supervision/safety;Sitting Lower Body Dressing: Sit to/from  stand;Moderate assistance Toilet Transfer: Min guard;Ambulation;RW;BSC Toileting- Clothing Manipulation and Hygiene: Sit to/from stand;Min guard Functional mobility during ADLs: Min guard;Rolling walker General ADL Comments: Pt performed part of LB bathing and participated in some dressing with encouragement. Pt requiring quite a bit of rest breaks. Educated on deep breathing technique.  Discussed energy conservation and how participating helps increase activity tolerance.    Mobility  Overal bed mobility: Modified Independent General bed mobility comments: pt in chair    Transfers  Overall transfer level: Needs assistance Equipment used: Rolling walker (2 wheeled) Transfers: Sit to/from Stand Sit to Stand: Min guard Stand pivot transfers: Min assist General transfer comment: cues for hand placement    Ambulation / Gait / Stairs / Wheelchair Mobility  Ambulation/Gait Ambulation/Gait assistance: Museum/gallery curator (Feet): 50 Feet (20 rest 30) Assistive device: Rolling walker (2 wheeled) Gait Pattern/deviations: Step-through pattern;Decreased stride length;Narrow base of support Gait velocity: decreased Gait velocity interpretation: Below normal speed for age/gender General Gait Details: pt required sitting rest break to progress mobility; pt ambulating on 3L of O2 initially; pt with SOB and labored breathing, bumped up to 4L O2 for second ambulation trial and pt able to increase mobility; cues for upright posture and RW adjusted to proper height     Posture / Balance      Special needs/care consideration Oxygen o2 at 2 liters [place]. New this admit Bowel mgmt: continent Bladder mgmt: urinal continent Diabetic mgmt no   Previous Home Environment Living Arrangements: Spouse/significant other  Lives With: Spouse Available Help at Discharge: Family;Available 24 hours/day Type of Home: House Home Layout: One level Home Access: Stairs to enter Entrance Stairs-Rails:  None Entrance Stairs-Number of Steps: 1 step Bathroom Shower/Tub: Product/process development scientist: Standard Bathroom Accessibility: Yes How Accessible: Accessible via walker Home Care Services: No Additional Comments: Has tub/shower and regular commode with sink close by  Discharge Living Setting Plans for Discharge Living Setting: Patient's home;Lives with (comment);Other (Comment) (wife) Type of Home at Discharge: House Discharge Home Layout: One level Discharge Home Access: Stairs to enter Entrance Stairs-Rails: None Entrance Stairs-Number of Steps: 1 step Discharge Bathroom Shower/Tub: Tub/shower unit;Curtain Discharge Bathroom Toilet: Standard Discharge Bathroom Accessibility: Yes How Accessible: Accessible via walker Does the patient have any problems obtaining your medications?: No  Social/Family/Support Systems Patient Roles: Spouse;Parent Contact Information: Greycen Felter, wife Anticipated Caregiver: wife Anticipated Caregiver's Contact Information: see above Ability/Limitations of Caregiver: no limitations, she is retired Careers adviser: 24/7 Discharge Plan Discussed with Primary Caregiver: Yes Is Caregiver In Agreement with Plan?: Yes Does Caregiver/Family have Issues with Lodging/Transportation while Pt is in Rehab?: No (wife stays with pt 24/7 in hospital)  Goals/Additional Needs Patient/Family Goal for Rehab: Mod I to supervision with PT, OT, and SLP  Expected length of stay: ELOS 8 to 12 day Pt/Family Agrees to Admission and willing to participate: Yes Program Orientation Provided & Reviewed with Pt/Caregiver Including Roles  & Responsibilities: Yes Additional Information Needs: pt was not on O2 pta   Decrease burden of Care through IP rehab admission: n/a  Possible need for SNF placement upon discharge:no   Patient Condition: This pt's medical and functional status remains the same since consultation 09/07/2013 in which the Rehabilitation  Physician determined and documented that the patient's condition is appropriate for intensive rehabilitative care in an inpatient rehabilitation facility. See History of present Illness for medical update. Functional changes are overall min assist. Patient's medical and functional status update has been discussed with the Rehabilitation physician and patient remains appropriate for inpt rehab. Will admit to inptrehab today.  Preadmission Screen Completed By:  Cleatrice Burke, 09/12/2013 1:53 PM ______________________________________________________________________   Discussed status with Dr. Naaman Plummer on 09/12/13 at  1352 and received telephone approval for admission today.  Admission Coordinator:  Cleatrice Burke, time 4562 Date 09/12/2013.

## 2013-09-12 NOTE — Progress Notes (Signed)
Pt transferred to Rehab from [facility]. Pt orientated to rehab expectations, rehab schedule, safety concerns, etc. Diagnostic information provided. Pt looking forward to starting Rehab in the morning.

## 2013-09-12 NOTE — H&P (View-Only) (Signed)
Physical Medicine and Rehabilitation Admission H&P    Chief Complaint  Patient presents with  . Shortness of Breath  . lung infection   : HPI: Willie [name] is a 73 y.o. right-handed male with history of CAD with CABG, non-Hodgkin's lymphoma on chemotherapy who presents 09/04/2013 with shortness of breath. No chest pain. Recent PET scan showed findings consistent with pneumonia. Patient developed low-grade fever and cough. Chest x-ray showed interval development of bilateral perihilar and basilar interstitial densities concerning for pulmonary edema or less likely pneumonia. CT angio of the chest showed no pulmonary emboli. Maintained on broad-spectrum antibiotics and since been completed. Follow up hematology services in reference to non-Hodgkin's lymphoma. Findings of small right apical PTX and monitored as well as pneumomediastinum versus chemotherapy related chemical pneumonitis and maintained on steroids. Follow cardiothoracic surgery in reference to pneumomediastinum with Gastrografin swallow completed showing no evidence of leak. No surgical therapy needed. Echocardiogram with ejection fraction of 60% no wall motion abnormalities. Patient placed on Lovenox for DVT prophylaxis. Hyponatremia 124- 126 and monitored with urine osmolality 409. Physical and occupational therapy evaluations 09/07/2013 for deconditioning noted patient tends to desaturate with activity. Recommendations made for physical medicine rehabilitation consult. Patient was admitted for comprehensive rehabilitation program   ROS Review of Systems  Constitutional: Positive for fever.  Respiratory: Positive for cough and shortness of breath.  Genitourinary:  Nocturia  Neurological: Positive for weakness.  All other systems reviewed and are negative  Past Medical History  Diagnosis Date  . Hypertension   . History of DVT of lower extremity     06/2001 as first sign of lymphoma: enlarged left inguinal nodes  . Low  serum testosterone level 11/10/2011  . 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 Nocturia 11/10/2011  . Normochromic anemia 11/10/2011    Likely result of prior high dose chemotherapy  . Unexplained weight loss 05/03/2012  . Mitral valvular regurgitation 06/01/12    moderate to severe by echo- asymptomatic  . Dyslipidemia   . RBBB   . Coronary artery disease   . Family history of anesthesia complication     "son, Margreta Journey, larynx collapses" (10/11/2012)  . Heart murmur   . History of blood transfusion 2009-2010    "both related to his chemo" (10/11/2012)  . Chronic renal insufficiency, stage III (moderate)   . Non Hodgkin's lymphoma 2003; 2009    "chemo; stem cell transplant & high powered chemo" (10/10/2012)  . Renal insufficiency   . Deep inguinal pain, right 05/26/2013  . Conjunctivitis, acute, right eye 06/28/2013   Past Surgical History  Procedure Laterality Date  . Bone marrow transplant  2010  . Cardiac catheterization  10/03/2012; 2007  . Coronary angioplasty with stent placement  10/11/2012    "2" (10/11/2012)  . Tonsillectomy  1950's  . Cataract extraction w/ intraocular lens implant Left 1990's  . Coronary artery bypass graft  03/29/2006    "CABG X5" (10/11/2012)   Family History  Problem Relation Age of Onset  . Heart failure Father   . Leukemia Brother 21  . Cancer - Lung Mother    Social History:  reports that he quit smoking about 44 years ago. His smoking use included Cigarettes. He has a 6.5 pack-year smoking history. He has never used smokeless tobacco. He reports that he does not drink alcohol or use illicit drugs. Allergies:  Allergies  Allergen Reactions  . Niaspan [Niacin Er] Rash   Medications Prior to Admission  Medication Sig Dispense Refill  .  amoxicillin-clavulanate (AUGMENTIN) 875-125 MG per tablet Take 1 tablet by mouth 2 (two) times daily.  14 tablet  0  . aspirin EC 81 MG tablet Take 81 mg by mouth every evening.      . Calcium Carbonate-Vitamin  D (CALCIUM 500 + D PO) Take 1 tablet by mouth daily.       . cholecalciferol (VITAMIN D) 1000 UNITS tablet Take 1,000 Units by mouth daily.       Marland Kitchen dexamethasone (DECADRON) 4 MG tablet Take 8 mg by mouth daily. Take 2 tablets twice daily for 2 days after chemotherapy treatment      . fish oil-omega-3 fatty acids 1000 MG capsule Take 1 g by mouth daily.       . metoprolol tartrate (LOPRESSOR) 25 MG tablet Take 25 mg by mouth every morning. verified that it is not XL      . Multiple Vitamin (MULTIVITAMIN WITH MINERALS) TABS Take 1 tablet by mouth daily.      . Potassium 99 MG TABS Take 1 tablet by mouth daily.      . prasugrel (EFFIENT) 10 MG TABS tablet Take 10 mg by mouth every evening.      . predniSONE (DELTASONE) 20 MG tablet Take 3 tablets (60 mg total) by mouth daily with breakfast.  30 tablet  0  . rosuvastatin (CRESTOR) 10 MG tablet Take 10 mg by mouth 2 (two) times a week. On Wednesday and Sunday      . testosterone cypionate (DEPOTESTOTERONE CYPIONATE) 200 MG/ML injection Inject into the muscle every 14 (fourteen) days.      . valACYclovir (VALTREX) 1000 MG tablet Take 500-1,000 mg by mouth daily. Take 2 tablets (1000 mg) twice daily for 7 days then 500 mg daily.      . vitamin B-12 (CYANOCOBALAMIN) 500 MCG tablet Take 500 mcg by mouth daily.      . nitroGLYCERIN (NITROSTAT) 0.4 MG SL tablet Place 1 tablet (0.4 mg total) under the tongue every 5 (five) minutes as needed for chest pain.  25 tablet  2  . ondansetron (ZOFRAN ODT) 8 MG disintegrating tablet Take 1 by mouth twice daily for 2 days to start day after chemotherapy treatment then 1 by mouth every 8 hours as needed for nausea or vomiting  20 tablet  6    Home: Home Living Family/patient expects to be discharged to:: Inpatient rehab Living Arrangements: Spouse/significant other Additional Comments: Has tub/shower and regular commode with sink close by   Functional History: Prior Function Level of Independence:  Independent  Functional Status:  Mobility: Bed Mobility Overal bed mobility: Modified Independent General bed mobility comments: not addressed; pt sitting in chair  Transfers Overall transfer level: Needs assistance Equipment used: Rolling walker (2 wheeled) Transfers: Sit to/from Stand Sit to Stand: Min guard;Min assist Stand pivot transfers: Min assist General transfer comment: min guard to min (A); pt with posterior lean initially; requiring min (A) to maintain balance; cues for hand placement and sequencing; pt with difficulty recalling safe technique with RW Ambulation/Gait Ambulation/Gait assistance: Min assist Ambulation Distance (Feet): 50 Feet (20 rest 30) Assistive device: Rolling walker (2 wheeled) Gait Pattern/deviations: Step-through pattern;Decreased stride length;Narrow base of support Gait velocity: decreased Gait velocity interpretation: Below normal speed for age/gender General Gait Details: pt required sitting rest break to progress mobility; pt ambulating on 3L of O2 initially; pt with SOB and labored breathing, bumped up to 4L O2 for second ambulation trial and pt able to increase mobility; cues for upright  posture and RW adjusted to proper height     ADL: ADL Overall ADL's : Needs assistance/impaired Eating/Feeding: Sitting;Supervision/ safety Grooming: Applying deodorant;Wash/dry face;Standing;Sitting;Min guard;Oral care (simulated oral care) Upper Body Bathing: Sitting;Set up;Supervision/ safety Lower Body Bathing: Sit to/from stand;Minimal assistance Upper Body Dressing : Set up;Supervision/safety;Sitting Lower Body Dressing: Minimal assistance;Sit to/from stand Toilet Transfer: Min guard;Ambulation;RW;Regular Toilet;Grab bars Toileting- Clothing Manipulation and Hygiene: Minimal assistance;Sit to/from stand Functional mobility during ADLs: Rolling walker;Min guard;Minimal assistance General ADL Comments: Educated on energy conservation techniques and also  explained how participating in activities helps increase activity tolerance. Recommended sitting for most of dressing/bathing. Educated on safe shoewear, discussed rugs, and also use of bag on walker to carry items. Educated on correct breathing technique and demonstrated. Pt fatigues very easily.  Simulated some ADL tasks at sink and also performed a few (applied deodorant, washed face).  Pt able to don/doff socks while sitting.  Cognition: Cognition Overall Cognitive Status: Within Functional Limits for tasks assessed Cognition Arousal/Alertness: Awake/alert Behavior During Therapy: WFL for tasks assessed/performed Overall Cognitive Status: Within Functional Limits for tasks assessed  Physical Exam: Blood pressure 146/68, pulse 108, temperature 97.3 F (36.3 C), temperature source Oral, resp. rate 18, height $RemoveBe'5\' 9"'xefBUsqSU$  (1.753 m), weight 60.782 kg (134 lb), SpO2 96.00%. Physical Exam Constitutional: He is oriented to person, place, and time.  73 year old frail Caucasian male  HENT: mucosa pink/moist, tongue a little dry Head: Normocephalic.  Eyes: EOM are normal.  Neck: Normal range of motion. Neck supple. No thyromegaly present.  Cardiovascular: Normal rate and regular rhythm. Systolic murmur Respiratory:  Decreased breath sounds at the bases particularly right base GI: Soft. Bowel sounds are normal. He exhibits no distension.  Neurological: He is alert and oriented to person, place, and time.  motor strength is 4+/5 bilateral deltoid, bicep, tricep, grip, hip flexors, knee extensors, ankle dorsiflexion plantar flexion  Sensation is intact to light touch and proprioception bilateral upper and lower limbs  Patient moves with delay, needs repeated cueing at times.  Psych: affect flat, cooperative. Wife answers for him frequently  Results for orders placed during the hospital encounter of 09/04/13 (from the past 48 hour(s))  CBC     Status: Abnormal   Collection Time    09/11/13  5:06 AM       Result Value Ref Range   WBC 20.6 (*) 4.0 - 10.5 K/uL   RBC 2.65 (*) 4.22 - 5.81 MIL/uL   Hemoglobin 9.5 (*) 13.0 - 17.0 g/dL   HCT 26.2 (*) 39.0 - 52.0 %   MCV 98.9  78.0 - 100.0 fL   MCH 35.8 (*) 26.0 - 34.0 pg   MCHC 36.3 (*) 30.0 - 36.0 g/dL   RDW 17.5 (*) 11.5 - 15.5 %   Platelets 208  150 - 400 K/uL  BASIC METABOLIC PANEL     Status: Abnormal   Collection Time    09/11/13  5:06 AM      Result Value Ref Range   Sodium 124 (*) 137 - 147 mEq/L   Potassium 4.4  3.7 - 5.3 mEq/L   Chloride 90 (*) 96 - 112 mEq/L   CO2 24  19 - 32 mEq/L   Glucose, Bld 98  70 - 99 mg/dL   BUN 39 (*) 6 - 23 mg/dL   Creatinine, Ser 1.65 (*) 0.50 - 1.35 mg/dL   Calcium 9.1  8.4 - 10.5 mg/dL   GFR calc non Af Amer 40 (*) >90 mL/min   GFR  calc Af Amer 47 (*) >90 mL/min   Comment: (NOTE)     The eGFR has been calculated using the CKD EPI equation.     This calculation has not been validated in all clinical situations.     eGFR's persistently <90 mL/min signify possible Chronic Kidney     Disease.   No results found.     Medical Problem List and Plan: 1. Functional deficits secondary to deconditioning after pneumonia, respiratory failure with history of non-Hodgkin's lymphoma 2.  DVT Prophylaxis/Anticoagulation: Subcutaneous Lovenox. Monitor platelet counts any signs of bleeding 3. Pain Management: Hydrocodone as needed. Monitor with increased mobility 4. Mood: Bouts of confusion discussed baseline with family 5. Neuropsych: This patient is not capable of making decisions on his own behalf. 6. Non-Hodgkin's lymphoma. Followup per Dr.Gorsuch. 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 Chemotherapy program to be reevaluated as outpatient 7. Hypertension. Lopressor 25 mg daily. Monitor with increased mobility 8. CAD with CABG. No chest pain or shortness of breath. Continue Effient 10 mg daily as well as aspirin 9. Hyperlipidemia. Lipitor 10. Chronic renal  insufficiency. Baseline creatinine 1.51. Followup labs 11. 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 Sodium 124-126. Continue sodium chloride tablets. Followup labs. Fluid restriction  Post Admission Physician Evaluation: 1. Functional deficits secondary  to deconditioning after pneumonia, respiratory failure. 2. Patient is admitted to receive collaborative, interdisciplinary care between the physiatrist, rehab nursing staff, and therapy team. 3. Patient's level of medical complexity and substantial therapy needs in context of that medical necessity cannot be provided at a lesser intensity of care such as a SNF. 4. Patient has experienced substantial functional loss from his/her baseline which was documented above under the "Functional History" and "Functional Status" headings.  Judging by the patient's diagnosis, physical exam, and functional history, the patient has potential for functional progress which will result in measurable gains while on inpatient rehab.  These gains will be of substantial and practical use upon discharge  in facilitating mobility and self-care at the household level. 5. Physiatrist will provide 24 hour management of medical needs as well as oversight of the therapy plan/treatment and provide guidance as appropriate regarding the interaction of the two. 6. 24 hour rehab nursing will assist with bladder management, bowel management, safety, skin/wound care, disease management, medication administration, pain management and patient education  and help integrate therapy concepts, techniques,education, etc. 7. PT will assess and treat for/with: Lower extremity strength, range of motion, stamina, balance, functional mobility, safety, adaptive techniques and equipment, NMR, stamina, family education.   Goals are: supervision to mod I. 8. OT will assess and treat for/with: ADL's, functional mobility, safety, upper extremity strength, adaptive techniques and equipment,NMR, stamina,  family education.   Goals are: supervision to mod I. 9. SLP will assess and treat for/with: n/a.  Goals are: n/a. 10. Case Management and Social Worker will assess and treat for psychological issues and discharge planning. 11. Team conference will be held weekly to assess progress toward goals and to determine barriers to discharge. 12. Patient will receive at least 3 hours of therapy per day at least 5 days per week. 13. ELOS: 7-10 days       14. Prognosis:  excellent     Meredith Staggers, MD, [facility]   09/12/2013

## 2013-09-12 NOTE — Telephone Encounter (Signed)
I have cancelled all appointment

## 2013-09-12 NOTE — Progress Notes (Signed)
Occupational Therapy Treatment Patient Details Name: REVEL STELLMACH MRN: [id number] DOB: [birth year] Today's Date: 09/12/2013    History of present illness pt presents with PNA/Pneumonitis related to chemo treatments.  pt being treated for Non-Hodgekins Lymphoma.     OT comments  Pt very fatigued during session requiring a lot of encouragement and rest breaks. Continue to recommend CIR.    Follow Up Recommendations  CIR    Equipment Recommendations  Other (comment) (defer to next venue)    Recommendations for Other Services Rehab consult    Precautions / Restrictions Precautions Precautions: Fall Precaution Comments: Respiratory Sats Restrictions Weight Bearing Restrictions: No       Mobility Bed Mobility               General bed mobility comments: pt in chair  Transfers Overall transfer level: Needs assistance Equipment used: Rolling walker (2 wheeled) Transfers: Sit to/from Stand Sit to Stand: Min guard         General transfer comment: cues for hand placement    Balance                                   ADL Overall ADL's : Needs assistance/impaired                 Upper Body Dressing : Set up;Supervision/safety;Sitting   Lower Body Dressing: Sit to/from stand;Moderate assistance   Toilet Transfer: Min guard;Ambulation;RW;BSC   Toileting- Clothing Manipulation and Hygiene: Sit to/from stand;Min guard       Functional mobility during ADLs: Min guard;Rolling walker General ADL Comments: Pt performed part of LB bathing and participated in some dressing with encouragement. Pt requiring quite a bit of rest breaks. Educated on deep breathing technique.  Discussed energy conservation and how participating helps increase activity tolerance.      Vision                     Perception     Praxis      Cognition   Behavior During Therapy: WFL for tasks assessed/performed Overall Cognitive Status: Within Functional  Limits for tasks assessed                       Extremity/Trunk Assessment               Exercises Other Exercises Other Exercises: performed a few theraband exercises   Shoulder Instructions       General Comments      Pertinent Vitals/ Pain       No pain reported. O2 in 70's at times on 2L. Bumped O2 up to 6L for O2 to trend up. Educated on deep breathing technique.  Home Living                                          Prior Functioning/Environment              Frequency Min 2X/week     Progress Toward Goals  OT Goals(current goals can now be found in the care plan section)  Progress towards OT goals: Progressing toward goals  Acute Rehab OT Goals Patient Stated Goal: not stated OT Goal Formulation: With patient Time For Goal Achievement: 09/23/13 Potential to Achieve Goals: Good ADL Goals Pt Will Perform Grooming: with modified  independence;standing Pt Will Perform Upper Body Bathing: with modified independence;sitting Pt Will Perform Lower Body Bathing: with modified independence;sit to/from stand Pt Will Perform Lower Body Dressing: with modified independence;sit to/from stand Pt Will Transfer to Toilet: with modified independence;ambulating;regular height toilet;grab bars Pt Will Perform Toileting - Clothing Manipulation and hygiene: with modified independence;sit to/from stand Additional ADL Goal #1: Pt will independently verbalize 3/3 energy conservation techniques. Additional ADL Goal #2: Pt will independently perform HEP for bilateral UE's.  Plan Discharge plan remains appropriate    Co-evaluation                 End of Session Equipment Utilized During Treatment: Gait belt;Rolling walker;Oxygen   Activity Tolerance Patient limited by fatigue   Patient Left in chair;with call bell/phone within reach;with family/visitor present   Nurse Communication          Time: 0932-6712 OT Time Calculation (min): 28  min  Charges: OT General Charges $OT Visit: 1 Procedure OT Treatments $Self Care/Home Management : 8-22 mins $Therapeutic Activity: 8-22 mins  Benito Mccreedy OTR/L [phone] 09/12/2013, 9:49 AM

## 2013-09-12 NOTE — Progress Notes (Signed)
I have insurance approval and can admit pt to inpt rehab today. Patient and his wife are in agreement. I contacted Dr. Coralyn Pear to arrange. RN CM are aware. I will make arrangements for today. [phone]

## 2013-09-12 NOTE — H&P (Signed)
Physical Medicine and Rehabilitation Admission H&P    Chief Complaint  Patient presents with  . Shortness of Breath  . lung infection   : HPI: Willie [name] is a 73 y.o. right-handed male with history of CAD with CABG, non-Hodgkin's lymphoma on chemotherapy who presents 09/04/2013 with shortness of breath. No chest pain. Recent PET scan showed findings consistent with pneumonia. Patient developed low-grade fever and cough. Chest x-ray showed interval development of bilateral perihilar and basilar interstitial densities concerning for pulmonary edema or less likely pneumonia. CT angio of the chest showed no pulmonary emboli. Maintained on broad-spectrum antibiotics and since been completed. Follow up hematology services in reference to non-Hodgkin's lymphoma. Findings of small right apical PTX and monitored as well as pneumomediastinum versus chemotherapy related chemical pneumonitis and maintained on steroids. Follow cardiothoracic surgery in reference to pneumomediastinum with Gastrografin swallow completed showing no evidence of leak. No surgical therapy needed. Echocardiogram with ejection fraction of 60% no wall motion abnormalities. Patient placed on Lovenox for DVT prophylaxis. Hyponatremia 124- 126 and monitored with urine osmolality 409. Physical and occupational therapy evaluations 09/07/2013 for deconditioning noted patient tends to desaturate with activity. Recommendations made for physical medicine rehabilitation consult. Patient was admitted for comprehensive rehabilitation program   ROS Review of Systems  Constitutional: Positive for fever.  Respiratory: Positive for cough and shortness of breath.  Genitourinary:  Nocturia  Neurological: Positive for weakness.  All other systems reviewed and are negative  Past Medical History  Diagnosis Date  . Hypertension   . History of DVT of lower extremity     06/2001 as first sign of lymphoma: enlarged left inguinal nodes  . Low  serum testosterone level 11/10/2011  . 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 Nocturia 11/10/2011  . Normochromic anemia 11/10/2011    Likely result of prior high dose chemotherapy  . Unexplained weight loss 05/03/2012  . Mitral valvular regurgitation 06/01/12    moderate to severe by echo- asymptomatic  . Dyslipidemia   . RBBB   . Coronary artery disease   . Family history of anesthesia complication     "son, Margreta Journey, larynx collapses" (10/11/2012)  . Heart murmur   . History of blood transfusion 2009-2010    "both related to his chemo" (10/11/2012)  . Chronic renal insufficiency, stage III (moderate)   . Non Hodgkin's lymphoma 2003; 2009    "chemo; stem cell transplant & high powered chemo" (10/10/2012)  . Renal insufficiency   . Deep inguinal pain, right 05/26/2013  . Conjunctivitis, acute, right eye 06/28/2013   Past Surgical History  Procedure Laterality Date  . Bone marrow transplant  2010  . Cardiac catheterization  10/03/2012; 2007  . Coronary angioplasty with stent placement  10/11/2012    "2" (10/11/2012)  . Tonsillectomy  1950's  . Cataract extraction w/ intraocular lens implant Left 1990's  . Coronary artery bypass graft  03/29/2006    "CABG X5" (10/11/2012)   Family History  Problem Relation Age of Onset  . Heart failure Father   . Leukemia Brother 21  . Cancer - Lung Mother    Social History:  reports that he quit smoking about 44 years ago. His smoking use included Cigarettes. He has a 6.5 pack-year smoking history. He has never used smokeless tobacco. He reports that he does not drink alcohol or use illicit drugs. Allergies:  Allergies  Allergen Reactions  . Niaspan [Niacin Er] Rash   Medications Prior to Admission  Medication Sig Dispense Refill  .  amoxicillin-clavulanate (AUGMENTIN) 875-125 MG per tablet Take 1 tablet by mouth 2 (two) times daily.  14 tablet  0  . aspirin EC 81 MG tablet Take 81 mg by mouth every evening.      . Calcium Carbonate-Vitamin  D (CALCIUM 500 + D PO) Take 1 tablet by mouth daily.       . cholecalciferol (VITAMIN D) 1000 UNITS tablet Take 1,000 Units by mouth daily.       Marland Kitchen dexamethasone (DECADRON) 4 MG tablet Take 8 mg by mouth daily. Take 2 tablets twice daily for 2 days after chemotherapy treatment      . fish oil-omega-3 fatty acids 1000 MG capsule Take 1 g by mouth daily.       . metoprolol tartrate (LOPRESSOR) 25 MG tablet Take 25 mg by mouth every morning. verified that it is not XL      . Multiple Vitamin (MULTIVITAMIN WITH MINERALS) TABS Take 1 tablet by mouth daily.      . Potassium 99 MG TABS Take 1 tablet by mouth daily.      . prasugrel (EFFIENT) 10 MG TABS tablet Take 10 mg by mouth every evening.      . predniSONE (DELTASONE) 20 MG tablet Take 3 tablets (60 mg total) by mouth daily with breakfast.  30 tablet  0  . rosuvastatin (CRESTOR) 10 MG tablet Take 10 mg by mouth 2 (two) times a week. On Wednesday and Sunday      . testosterone cypionate (DEPOTESTOTERONE CYPIONATE) 200 MG/ML injection Inject into the muscle every 14 (fourteen) days.      . valACYclovir (VALTREX) 1000 MG tablet Take 500-1,000 mg by mouth daily. Take 2 tablets (1000 mg) twice daily for 7 days then 500 mg daily.      . vitamin B-12 (CYANOCOBALAMIN) 500 MCG tablet Take 500 mcg by mouth daily.      . nitroGLYCERIN (NITROSTAT) 0.4 MG SL tablet Place 1 tablet (0.4 mg total) under the tongue every 5 (five) minutes as needed for chest pain.  25 tablet  2  . ondansetron (ZOFRAN ODT) 8 MG disintegrating tablet Take 1 by mouth twice daily for 2 days to start day after chemotherapy treatment then 1 by mouth every 8 hours as needed for nausea or vomiting  20 tablet  6    Home: Home Living Family/patient expects to be discharged to:: Inpatient rehab Living Arrangements: Spouse/significant other Additional Comments: Has tub/shower and regular commode with sink close by   Functional History: Prior Function Level of Independence:  Independent  Functional Status:  Mobility: Bed Mobility Overal bed mobility: Modified Independent General bed mobility comments: not addressed; pt sitting in chair  Transfers Overall transfer level: Needs assistance Equipment used: Rolling walker (2 wheeled) Transfers: Sit to/from Stand Sit to Stand: Min guard;Min assist Stand pivot transfers: Min assist General transfer comment: min guard to min (A); pt with posterior lean initially; requiring min (A) to maintain balance; cues for hand placement and sequencing; pt with difficulty recalling safe technique with RW Ambulation/Gait Ambulation/Gait assistance: Min assist Ambulation Distance (Feet): 50 Feet (20 rest 30) Assistive device: Rolling walker (2 wheeled) Gait Pattern/deviations: Step-through pattern;Decreased stride length;Narrow base of support Gait velocity: decreased Gait velocity interpretation: Below normal speed for age/gender General Gait Details: pt required sitting rest break to progress mobility; pt ambulating on 3L of O2 initially; pt with SOB and labored breathing, bumped up to 4L O2 for second ambulation trial and pt able to increase mobility; cues for upright  posture and RW adjusted to proper height     ADL: ADL Overall ADL's : Needs assistance/impaired Eating/Feeding: Sitting;Supervision/ safety Grooming: Applying deodorant;Wash/dry face;Standing;Sitting;Min guard;Oral care (simulated oral care) Upper Body Bathing: Sitting;Set up;Supervision/ safety Lower Body Bathing: Sit to/from stand;Minimal assistance Upper Body Dressing : Set up;Supervision/safety;Sitting Lower Body Dressing: Minimal assistance;Sit to/from stand Toilet Transfer: Min guard;Ambulation;RW;Regular Toilet;Grab bars Toileting- Clothing Manipulation and Hygiene: Minimal assistance;Sit to/from stand Functional mobility during ADLs: Rolling walker;Min guard;Minimal assistance General ADL Comments: Educated on energy conservation techniques and also  explained how participating in activities helps increase activity tolerance. Recommended sitting for most of dressing/bathing. Educated on safe shoewear, discussed rugs, and also use of bag on walker to carry items. Educated on correct breathing technique and demonstrated. Pt fatigues very easily.  Simulated some ADL tasks at sink and also performed a few (applied deodorant, washed face).  Pt able to don/doff socks while sitting.  Cognition: Cognition Overall Cognitive Status: Within Functional Limits for tasks assessed Cognition Arousal/Alertness: Awake/alert Behavior During Therapy: WFL for tasks assessed/performed Overall Cognitive Status: Within Functional Limits for tasks assessed  Physical Exam: Blood pressure 146/68, pulse 108, temperature 97.3 F (36.3 C), temperature source Oral, resp. rate 18, height $RemoveBe'5\' 9"'rrGpitYLv$  (1.753 m), weight 60.782 kg (134 lb), SpO2 96.00%. Physical Exam Constitutional: He is oriented to person, place, and time.  73 year old frail Caucasian male  HENT: mucosa pink/moist, tongue a little dry Head: Normocephalic.  Eyes: EOM are normal.  Neck: Normal range of motion. Neck supple. No thyromegaly present.  Cardiovascular: Normal rate and regular rhythm. Systolic murmur Respiratory:  Decreased breath sounds at the bases particularly right base GI: Soft. Bowel sounds are normal. He exhibits no distension.  Neurological: He is alert and oriented to person, place, and time.  motor strength is 4+/5 bilateral deltoid, bicep, tricep, grip, hip flexors, knee extensors, ankle dorsiflexion plantar flexion  Sensation is intact to light touch and proprioception bilateral upper and lower limbs  Patient moves with delay, needs repeated cueing at times.  Psych: affect flat, cooperative. Wife answers for him frequently  Results for orders placed during the hospital encounter of 09/04/13 (from the past 48 hour(s))  CBC     Status: Abnormal   Collection Time    09/11/13  5:06 AM       Result Value Ref Range   WBC 20.6 (*) 4.0 - 10.5 K/uL   RBC 2.65 (*) 4.22 - 5.81 MIL/uL   Hemoglobin 9.5 (*) 13.0 - 17.0 g/dL   HCT 26.2 (*) 39.0 - 52.0 %   MCV 98.9  78.0 - 100.0 fL   MCH 35.8 (*) 26.0 - 34.0 pg   MCHC 36.3 (*) 30.0 - 36.0 g/dL   RDW 17.5 (*) 11.5 - 15.5 %   Platelets 208  150 - 400 K/uL  BASIC METABOLIC PANEL     Status: Abnormal   Collection Time    09/11/13  5:06 AM      Result Value Ref Range   Sodium 124 (*) 137 - 147 mEq/L   Potassium 4.4  3.7 - 5.3 mEq/L   Chloride 90 (*) 96 - 112 mEq/L   CO2 24  19 - 32 mEq/L   Glucose, Bld 98  70 - 99 mg/dL   BUN 39 (*) 6 - 23 mg/dL   Creatinine, Ser 1.65 (*) 0.50 - 1.35 mg/dL   Calcium 9.1  8.4 - 10.5 mg/dL   GFR calc non Af Amer 40 (*) >90 mL/min   GFR  calc Af Amer 47 (*) >90 mL/min   Comment: (NOTE)     The eGFR has been calculated using the CKD EPI equation.     This calculation has not been validated in all clinical situations.     eGFR's persistently <90 mL/min signify possible Chronic Kidney     Disease.   No results found.     Medical Problem List and Plan: 1. Functional deficits secondary to deconditioning after pneumonia, respiratory failure with history of non-Hodgkin's lymphoma 2.  DVT Prophylaxis/Anticoagulation: Subcutaneous Lovenox. Monitor platelet counts any signs of bleeding 3. Pain Management: Hydrocodone as needed. Monitor with increased mobility 4. Mood: Bouts of confusion discussed baseline with family 5. Neuropsych: This patient is not capable of making decisions on his own behalf. 6. Non-Hodgkin's lymphoma. Followup per Dr.Gorsuch. 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 Chemotherapy program to be reevaluated as outpatient 7. Hypertension. Lopressor 25 mg daily. Monitor with increased mobility 8. CAD with CABG. No chest pain or shortness of breath. Continue Effient 10 mg daily as well as aspirin 9. Hyperlipidemia. Lipitor 10. Chronic renal  insufficiency. Baseline creatinine 1.51. Followup labs 11. 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 Sodium 124-126. Continue sodium chloride tablets. Followup labs. Fluid restriction  Post Admission Physician Evaluation: 1. Functional deficits secondary  to deconditioning after pneumonia, respiratory failure. 2. Patient is admitted to receive collaborative, interdisciplinary care between the physiatrist, rehab nursing staff, and therapy team. 3. Patient's level of medical complexity and substantial therapy needs in context of that medical necessity cannot be provided at a lesser intensity of care such as a SNF. 4. Patient has experienced substantial functional loss from his/her baseline which was documented above under the "Functional History" and "Functional Status" headings.  Judging by the patient's diagnosis, physical exam, and functional history, the patient has potential for functional progress which will result in measurable gains while on inpatient rehab.  These gains will be of substantial and practical use upon discharge  in facilitating mobility and self-care at the household level. 5. Physiatrist will provide 24 hour management of medical needs as well as oversight of the therapy plan/treatment and provide guidance as appropriate regarding the interaction of the two. 6. 24 hour rehab nursing will assist with bladder management, bowel management, safety, skin/wound care, disease management, medication administration, pain management and patient education  and help integrate therapy concepts, techniques,education, etc. 7. PT will assess and treat for/with: Lower extremity strength, range of motion, stamina, balance, functional mobility, safety, adaptive techniques and equipment, NMR, stamina, family education.   Goals are: supervision to mod I. 8. OT will assess and treat for/with: ADL's, functional mobility, safety, upper extremity strength, adaptive techniques and equipment,NMR, stamina,  family education.   Goals are: supervision to mod I. 9. SLP will assess and treat for/with: n/a.  Goals are: n/a. 10. Case Management and Social Worker will assess and treat for psychological issues and discharge planning. 11. Team conference will be held weekly to assess progress toward goals and to determine barriers to discharge. 12. Patient will receive at least 3 hours of therapy per day at least 5 days per week. 13. ELOS: 7-10 days       14. Prognosis:  excellent     Meredith Staggers, MD, [facility]   09/12/2013

## 2013-09-12 NOTE — Discharge Summary (Addendum)
Physician Discharge Summary  Willie [name] CXK:[id number] DOB: [birth year] DOA: 09/04/2013  PCP: Horatio Pel, MD  Admit date: 09/04/2013 Discharge date: 09/12/2013  Time spent: 35 minutes  Recommendations for Outpatient Follow-up:  1. 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 male  With h/o NHL, on chemo presents with severe shortness of breath. No chest pain. Has noted mild ankle swelling for the past few months. Had PET scan on 08/25/2013 which showed findings consistent with pneumonia. Has been on Augmentin since then, prescribed by Dr. Alvy Bimler. Also there was concern of COPD so he was started on prednisone 60 mg daily for a week. Had fevers and scant cough. The fevers have largely resolved but he has severe dyspnea on exertion so presented to the emergency room. Chest x-ray showed interval development of bilateral perihilar and basilar interstitial densities concerning for pulmonary edema or less likely pneumonia. CT angiogram of the chest showed large volume pneumomediastinum extending into the neck. No pulmonary embolus. Small right apical pneumothorax, less than 5%. Dependent groundglass opacity and fine interstitial opacities similar to CAT scan on 08/25/2013, consider atypical infection or PCP pneumonia. Pro BNP is 3000. Oxygen  saturations 89% on room air. Corrected with nasal cannula oxygen. No previous BNPs for comparison. echocardiogram 2014 showed normal ejection fraction and moderate mitral valve prolapse with moderate regurgitation.  The patient has received oxygen, vancomycin, Zosyn in the emergency room. Dr. Tawnya Crook spoke with Dr. Lawson Fiscal, [place] [facility]. No imminent need for procedure, but he will consult. Will therefore transfer to [facility]. Patient agreeable to plan.  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 He was admitted to the medicine service on 09/04/2013 presenting with complaints of shortness of breath. CT scan of lungs revealed large volume pneumomediastinum extending into the neck as well as a small right apical pneumothorax (less than 5%). Also noted were dependent groundglass opacities and fine interstitial opacities unchanged from previous study on 08/25/2013. Patient was seen and evaluated by cardiothoracic surgery and pulmonary critical care medicine. He had a Gastrografin swallow study that did not reveal evidence of extravasation to explain pneumomediastinum. He had serial chest x-rays which did not reveal increase in the small apical right pneumothorax. Surgical intervention was not recommended. It was thought that perhaps this could be related to drug pneumonitis in setting of brentuximab therapy. During this hospitalization he was seen and evaluated by medical oncology. Recommended continuing prednisone. Also felt that chemotherapy would need to be reevaluated on hospital followup. Patient's oxygen was slowly titrated down. Other issues addressed through this hospitalization include hyponatremia.  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 He was placed on 1200 mL fluid restriction . Patient's sodium level 124 on 09/12/2013. Lasix repeat BMP in 2 days to follow sodium levels. Given significant debility and deconditioning, he was felt to be candidate for inpatient rehabilitation. Patient was discharged to inpatient rehabilitation on 09/12/2013.   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  -Case discussed with Dr Elsworth Soho, did not feel this reflected PCP, recommended stopping Vancomycin and bactrim  Hyponatremia  -Sodium of 126-?SIADH from lung opacties  -On 1200 mL fluid restriction  -Sodium gradually improving, labs this morning showed sodium on 124, improved from 122  Severe Mitral Regurgitation  -2-D echo showing moderate, holosystolicprolapse, involving the anterior leaflet. 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 He had an initial complete response to 6 cycles of CHOP chemotherapy. He relapsed October 2009 with extensive infiltration of tumor into the right iliopsoas muscle presenting as an erythematous nodular cutaneous mass in the right inguinal region. No other disease outside of the muscle noted on PET scan. 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 He was recently found to have a recurrence in the soft tissues of the right inguinal region. He began brentuximab on 06/28/2013."If above symptoms deemed to be drug related -may need to change regimen.  Hx of CAD-s/p CABG in 2007, and PCI in June 2014  -c/w ASA/Effient/Metoprolol and statins  -SOB likely secondary to above, no chest pain.  Procedures:  Transthoracic echocardiogram performed on 09/06/2013 showing ejection fraction of 55-60% without wall motion abnormalities.  Consultations:  Pulmonary critical care medicine  Cardiac thoracic surgery  Medical oncology  Physical therapy/occupational therapy  CIR  Discharge Exam: Filed Vitals:   09/12/13 1019  BP: 146/86  Pulse:   Temp:   Resp:    Gen Exam: Awake and alert with clear speech, ill-appearing  Neck: Supple, No JVD.  Chest: Clear to auscultation, remains of supplemental oxygen  CVS: S1 S2 Regular, no murmurs.  Abdomen: soft, BS +, non tender, non distended.  Extremities: no edema, lower extremities warm to touch.  Neurologic: Non Focal.  Skin: No Rash.  Wounds: N/A.     Discharge Instructions You were cared for by a hospitalist during your hospital stay. If you have any questions about your discharge medications or the care you received while you were in the hospital after you are discharged, you can call the unit and asked to speak with the hospitalist on call if the hospitalist that took care of you is not available. Once you are discharged, your primary care physician will handle any further medical issues. Please note that NO REFILLS for any discharge medications will be authorized once you are discharged, as it is imperative that you return to your primary care physician (or establish  a relationship with a primary care physician if you do not have one) for your aftercare needs so that they can reassess your need  for medications and monitor your lab values.  Discharge Instructions   Call MD for:  difficulty breathing, headache or visual disturbances    Complete by:  As directed      Call MD for:  extreme fatigue    Complete by:  As directed      Call MD for:  persistant dizziness or light-headedness    Complete by:  As directed      Call MD for:  persistant nausea and vomiting    Complete by:  As directed      Call MD for:  temperature >100.4    Complete by:  As directed      Diet - low sodium heart healthy    Complete by:  As directed      Increase activity slowly    Complete by:  As directed             Medication List    STOP taking these medications       amoxicillin-clavulanate 875-125 MG per tablet  Commonly known as:  AUGMENTIN     dexamethasone 4 MG tablet  Commonly known as:  DECADRON     ondansetron 8 MG disintegrating tablet  Commonly known as:  ZOFRAN ODT     Potassium 99 MG Tabs     testosterone cypionate 200 MG/ML injection  Commonly known as:  DEPOTESTOTERONE CYPIONATE     vitamin B-12 500 MCG tablet  Commonly known as:  CYANOCOBALAMIN      TAKE these medications       albuterol (2.5 MG/3ML) 0.083% nebulizer solution  Commonly known as:  PROVENTIL  Take 3 mLs (2.5 mg total) by nebulization every 2 (two) hours as needed for wheezing.     alum & mag hydroxide-simeth 200-200-20 MG/5ML suspension  Commonly known as:  MAALOX/MYLANTA  Take 30 mLs by mouth every 6 (six) hours as needed for indigestion or heartburn (dyspepsia).     aspirin EC 81 MG tablet  Take 81 mg by mouth every evening.     CALCIUM 500 + D PO  Take 1 tablet by mouth daily.     cholecalciferol 1000 UNITS tablet  Commonly known as:  VITAMIN D  Take 1,000 Units by mouth daily.     DSS 100 MG Caps  Take 100 mg by mouth 2 (two) times daily.     fish oil-omega-3 fatty acids 1000 MG capsule  Take 1 g by mouth daily.     HYDROcodone-acetaminophen 5-325 MG per tablet  Commonly known as:   NORCO/VICODIN  Take 1-2 tablets by mouth every 4 (four) hours as needed for moderate pain.     metoprolol tartrate 25 MG tablet  Commonly known as:  LOPRESSOR  Take 25 mg by mouth every morning. verified that it is not XL     multivitamin with minerals Tabs tablet  Take 1 tablet by mouth daily.     nitroGLYCERIN 0.4 MG SL tablet  Commonly known as:  NITROSTAT  Place 1 tablet (0.4 mg total) under the tongue every 5 (five) minutes as needed for chest pain.     prasugrel 10 MG Tabs tablet  Commonly known as:  EFFIENT  Take 10 mg by mouth every evening.     predniSONE 20 MG tablet  Commonly known as:  DELTASONE  Take 3 tablets (60 mg total) by  mouth daily with breakfast.     rosuvastatin 10 MG tablet  Commonly known as:  CRESTOR  Take 10 mg by mouth 2 (two) times a week. On Wednesday and Sunday     senna-docusate 8.6-50 MG per tablet  Commonly known as:  Senokot-S  Take 1 tablet by mouth at bedtime as needed for mild constipation.     valACYclovir 1000 MG tablet  Commonly known as:  VALTREX  Take 500-1,000 mg by mouth daily. Take 2 tablets (1000 mg) twice daily for 7 days then 500 mg daily.       Allergies  Allergen Reactions  . Niaspan [Niacin Er] Rash       Follow-up Information   Follow up with PARRETT,TAMMY, NP On 10/12/2013. (10-30 am)    Specialty:  Nurse Practitioner   Contact information:   [address] [place] [phone]       Follow up with [facility], NI, MD In 2 weeks.   Specialty:  Hematology and Oncology   Contact information:   [place] [phone]        The results of significant diagnostics from this hospitalization (including imaging, microbiology, ancillary and laboratory) are listed below for reference.    Significant Diagnostic Studies: Dg Chest 2 View  09/09/2013   CLINICAL DATA:  Increased sputum production  EXAM: CHEST  2 VIEW  COMPARISON:  09/07/2013  FINDINGS: Pneumomediastinum and  neck base subcutaneous emphysema is stable. No convincing pneumothorax.  Irregular interstitial and airspace densities in the perihilar and lower lungs is stable. No pleural effusion.  Cardiac silhouette is normal in size. Changes from cardiac surgery are stable.  IMPRESSION: 1. No change from the prior study. Persistent pneumomediastinum and neck base subcutaneous emphysema. Persistent ill-defined interstitial and airspace opacities consistent with the focal pneumonia.   Electronically Signed   By: Lajean Manes M.D.   On: 09/09/2013 16:54   Dg Chest 2 View  09/06/2013   CLINICAL DATA:  Right-sided pneumothorax  EXAM: CHEST  2 VIEW  COMPARISON:  Portable chest of Sep 05, 2013. And barium esophagram of the same day  FINDINGS: No pneumothorax visible. Small amounts of mediastinal air the in right perihilar region and in bilateral upper paratracheal region similar to prior. No pleural effusion. Minimal prominence of pulmonary interstitium. Cardiac silhouette and pulmonary vascularity within limits of normal.  IMPRESSION: 1. No visible right pneumothorax.  Pneumomediastinum persists. 2. Mild stable pulmonary interstitial edema.   Electronically Signed   By: David  Martinique   On: 09/06/2013 09:41   Dg Chest 2 View  09/04/2013   CLINICAL DATA:  Shortness of breath.  EXAM: CHEST  2 VIEW  COMPARISON:  June 17, 2013.  FINDINGS: Stable cardiomediastinal silhouette. Status post coronary artery bypass graft. No pneumothorax or significant pleural effusion is noted. Interval development of bilateral perihilar and basilar interstitial densities are noted concerning for pulmonary edema.  IMPRESSION: Interval development of bilateral perihilar and basilar interstitial densities concerning for pulmonary edema or less likely pneumonia.   Electronically Signed   By: Sabino Dick M.D.   On: 09/04/2013 16:01   Ct Angio Chest Pe W/cm &/or Wo Cm  09/04/2013   CLINICAL DATA:  Shortness of breath, tachycardia, hypoxia. History  of DVT.  EXAM: CT ANGIOGRAPHY CHEST WITH CONTRAST  TECHNIQUE: Multidetector CT imaging of the chest was performed using the standard protocol during bolus administration of intravenous contrast. Multiplanar CT image reconstructions and MIPs were obtained to evaluate  the vascular anatomy.  CONTRAST:  125mL OMNIPAQUE IOHEXOL 350 MG/ML SOLN  COMPARISON:  05/12/2013  FINDINGS: THORACIC INLET/BODY WALL:  Emphysema.  MEDIASTINUM:  Large volume of pneumomediastinum, extending into the neck.  No cardiomegaly. No pericardial effusion. Extensive atherosclerosis, including the coronary arteries. Status post CABG with LIMA and upper venous graft widely patent. The graft associated with the lower ostium marker is not clearly visible, presumably chronic given history. There is occasional respiratory motion, which decreases sensitivity at the affected levels. Overall, exam is diagnostic. There is no evidence of acute pulmonary embolism. No evidence of esophageal thickening. No fluid collection. No adenopathy.  LUNG WINDOWS:  Small right apical pneumothorax, less than 5%.  There is dependent ground-glass and fine interstitial opacities. Changes are similar to CT 08/25/2013. No pleural effusion or interlobular septal thickening.  UPPER ABDOMEN:  Presumed cyst in the upper right liver, stable from previous imaging.  OSSEOUS:  No acute fracture.  No suspicious lytic or blastic lesions.  Critical Value/emergent results were called by telephone at the time of interpretation on 09/04/2013 at 5:34 PM to Dr. Jinny Blossom, [facility] , who verbally acknowledged these results.  Review of the MIP images confirms the above findings.  IMPRESSION: 1. Pneumomediastinum and trace right apical pneumothorax (less than 5%). This is likely from pulmonic air leak given #2. 2. Unchanged ground-glass opacities in the lower lungs, favoring an inflammatory (including drug reaction) pneumonitis or atypical infection. Given immunocompromised state (in the setting of  lymphoma), consider PCP pneumonia - which can predispose to pneumothorax). 3. Negative for acute pulmonary embolism.   Electronically Signed   By: Jorje Guild M.D.   On: 09/04/2013 17:41   Nm Pet Image Restag (ps) Skull Base To Thigh  08/25/2013   CLINICAL DATA:  Subsequent treatment strategy for lymphoma.  EXAM: NUCLEAR MEDICINE PET SKULL BASE TO THIGH  TECHNIQUE: 7.3 mCi F-18 FDG was injected intravenously. Full-ring PET imaging was performed from the skull base to thigh after the radiotracer. CT data was obtained and used for attenuation correction and anatomic localization.  FASTING BLOOD GLUCOSE:  Value: 96 mg/dl  COMPARISON:  PET-CT scan 06/21/2013  FINDINGS: NECK  No hypermetabolic lymph nodes in the neck.  CHEST  There is new hypermetabolic activity associated with ground-glass opacities primarily in the lower lobes but also scattered within the upper lobes. No mass lesion associated with the these ground-glass hypermetabolic metastases.  The hypermetabolic activity extends into the inferior pleural surfaces.  There several small nodules in the upper lobes. For example 4 mm nodule (image 64 or, series 4)in the right upper lobe. The small nodules are unchanged from prior.  ABDOMEN/PELVIS  No abnormal metabolic activity within the liver or spleen. Spleen is normal volume. Interval resolution of the hypermetabolic activity within the right inguinal lymph nodes. The lymph nodes have hypermetabolic activity less than mediastinum. No measurable enlarged lymph nodes remain.  SKELETON  No focal hypermetabolic activity to suggest skeletal metastasis.  IMPRESSION: 1. Interval resolution of hypermetabolic right inguinal lymph nodes ( [place] 1). 2. New extensive hypermetabolic ground-glass opacities predominantly in the lower lobes. Burtis Junes this represent to be a drug reaction or other inflammatory or infectious process. Do not favor lymphoma recurrence.  Findings conveyed tocancer center triage nurse, [place]  08/25/2013 at11:17.   Electronically Signed   By: Suzy Bouchard M.D.   On: 08/25/2013 11:20   Dg Chest Port 1 View  09/07/2013   CLINICAL DATA:  Short of breath  EXAM: PORTABLE CHEST - 1 VIEW  COMPARISON:  09/06/2013  FINDINGS: Progression of pneumomediastinum, increased subcutaneous gas in the neck.  Negative for pneumothorax  Bibasilar airspace disease unchanged from the prior study. No effusion.  IMPRESSION: Progression of pneumomediastinum and gas in the neck. No pneumothorax.  Bibasilar airspace disease unchanged.   Electronically Signed   By: Franchot Gallo M.D.   On: 09/07/2013 16:27   Dg Chest Port 1 View  09/05/2013   CLINICAL DATA:  Pneumothorax, short of breath, nausea  EXAM: PORTABLE CHEST - 1 VIEW  COMPARISON:  Prior chest x-ray and CT chest 09/04/2013  FINDINGS: Stable trace right apical pneumothorax. The mediastinal air is again a present and tracks into the soft tissues of the right greater than left neck. No significant interval exacerbation. Stable cardiac and mediastinal contours. Patient is status post median sternotomy with evidence of prior multivessel CABG including LIMA bypass. Similar to slightly increased left greater than right bibasilar patchy airspace opacities. No pleural effusion. No acute osseous abnormality.  IMPRESSION: 1. Stable pneumomediastinum in trace right apical pneumothorax. 2. Similar to slightly increased left greater than right bibasilar opacities. Differential considerations remain unchanged including pneumonitis and atypical infection.   Electronically Signed   By: Jacqulynn Cadet M.D.   On: 09/05/2013 08:18   Dg Esophagus W/water Sol Cm  09/05/2013   CLINICAL DATA:  Evaluate for esophageal rupture  EXAM: ESOPHOGRAM/BARIUM SWALLOW  TECHNIQUE: Single contrast examination was performed using water-soluble contrast.  FLUOROSCOPY TIME:  55 seconds  50 cc Omnipaque 300  COMPARISON:  09/05/2013  FINDINGS: The esophagus is patent. No stricture or mass identified.  No abnormal extraluminal extravasation of contrast identified to suggest esophageal leak/ rupture.  IMPRESSION: Negative exam.   Electronically Signed   By: Kerby Moors M.D.   On: 09/05/2013 15:26    Microbiology: Recent Results (from the past 240 hour(s))  CULTURE, BLOOD (ROUTINE X 2)     Status: None   Collection Time    09/04/13  4:15 PM      Result Value Ref Range Status   Specimen Description BLOOD RIGHT FOREARM   Final   Special Requests BOTTLES DRAWN AEROBIC AND ANAEROBIC 5ML   Final   Culture  Setup Time     Final   Value: 09/04/2013 22:54     Performed at [facility]   Culture     Final   Value: NO GROWTH 5 DAYS     Performed at [facility]   Report Status 09/10/2013 FINAL   Final  CULTURE, BLOOD (ROUTINE X 2)     Status: None   Collection Time    09/04/13  6:11 PM      Result Value Ref Range Status   Specimen Description BLOOD LEFT ARM   Final   Special Requests BOTTLES DRAWN AEROBIC AND ANAEROBIC 4ML   Final   Culture  Setup Time     Final   Value: 09/04/2013 22:55     Performed at [facility]   Culture     Final   Value: NO GROWTH 5 DAYS     Performed at [facility]   Report Status 09/10/2013 FINAL   Final     Labs: Basic Metabolic Panel:  Recent Labs Lab 09/08/13 0720 09/09/13 0400 09/10/13 0618 09/11/13 0506 09/12/13 0856  NA 124* 122* 123* 124* 124*  K 4.1 3.9 4.4 4.4 4.8  CL 87* 86* 86* 90* 87*  CO2 $Re'26 24 22 24 25  'uXN$ GLUCOSE 84 100* 117* 98  109*  BUN 30* 32* 31* 39* 44*  CREATININE 1.58* 1.46* 1.52* 1.65* 1.70*  CALCIUM 8.9 8.7 9.4 9.1 10.3   Liver Function Tests: No results found for this basename: AST, ALT, ALKPHOS, BILITOT, PROT, ALBUMIN,  in the last 168 hours No results found for this basename: LIPASE, AMYLASE,  in the last 168 hours No results found for this basename: AMMONIA,  in the last 168 hours CBC:  Recent Labs Lab 09/07/13 0835 09/08/13 0720 09/09/13 0400 09/10/13 0618 09/11/13 0506  WBC  16.8* 17.6* 16.5* 19.1* 20.6*  HGB 10.8* 11.3* 10.3* 10.4* 9.5*  HCT 31.0* 32.3* 29.3* 30.0* 26.2*  MCV 100.0 99.4 100.0 100.7* 98.9  PLT 243 237 194 233 208   Cardiac Enzymes: No results found for this basename: CKTOTAL, CKMB, CKMBINDEX, TROPONINI,  in the last 168 hours BNP: BNP (last 3 results)  Recent Labs  09/04/13 1610  PROBNP 3670.0*   CBG:  Recent Labs Lab 09/05/13 1627 09/06/13 0638 09/07/13 0643  GLUCAP 175* 154* 138*       Signed:  [facility] 09/12/2013, 11:46 AM

## 2013-09-12 NOTE — Progress Notes (Signed)
Physical Medicine and Rehabilitation Consult  Reason for Consult: Deconditioning/pneumonia/NHL  Referring Physician: [facility]  HPI: Willie [name] is a 73 y.o. 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 No chest pain. Recent PET scan showed findings consistent with pneumonia. Patient developed low-grade fever and cough. Chest x-ray showed interval development of bilateral perihilar and basilar interstitial densities concerning for pulmonary edema or less likely pneumonia. CT angio of the chest showed no pulmonary emboli. Maintained on broad-spectrum antibiotics. Follow up hematology services in reference to non-Hodgkin's trauma. Findings of small right apical PTX and monitored as well as pneumomediastinum. Follow cardiothoracic surgery in reference to pneumomediastinum with Gastrografin swallow completed showing no evidence of leak. No surgical therapy needed. Echocardiogram with ejection fraction of 60% no wall motion abnormalities. Patient placed on Lovenox for DVT prophylaxis. Hyponatremia 126 and monitored. Physical therapy evaluation 09/07/2013 for deconditioning noted patient tends to desaturate with activity. Recommendations made for physical medicine rehabilitation consult  Patient has been more confused since hospitalization according to his son.  Review of Systems  Constitutional: Positive for fever.  Respiratory: Positive for cough and shortness of breath.  Genitourinary:  Nocturia  Neurological: Positive for weakness.  All other systems reviewed and are negative.   Past Medical History   Diagnosis  Date   .  Hypertension    .  History of DVT of lower extremity      06/2001 as first sign of lymphoma: enlarged left inguinal nodes   .  Low serum testosterone level  11/10/2011   .  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  Nocturia  11/10/2011   .  Normochromic anemia  11/10/2011     Likely result of  prior high dose chemotherapy   .  Unexplained weight loss  05/03/2012   .  Mitral valvular regurgitation  06/01/12     moderate to severe by echo- asymptomatic   .  Dyslipidemia    .  RBBB    .  Coronary artery disease    .  Family history of anesthesia complication      "son, Margreta Journey, larynx collapses" (10/11/2012)   .  Heart murmur    .  History of blood transfusion  2009-2010     "both related to his chemo" (10/11/2012)   .  Chronic renal insufficiency, stage III (moderate)    .  Non Hodgkin's lymphoma  2003; 2009     "chemo; stem cell transplant & high powered chemo" (10/10/2012)   .  Renal insufficiency    .  Deep inguinal pain, right  05/26/2013   .  Conjunctivitis, acute, right eye  06/28/2013    Past Surgical History   Procedure  Laterality  Date   .  Bone marrow transplant   2010   .  Cardiac catheterization   10/03/2012; 2007   .  Coronary angioplasty with stent placement   10/11/2012     "2" (10/11/2012)   .  Tonsillectomy   1950's   .  Cataract extraction w/ intraocular lens implant  Left  1990's   .  Coronary artery bypass graft   03/29/2006     "CABG X5" (10/11/2012)    Family History   Problem  Relation  Age of Onset   .  Heart failure  Father    .  Leukemia  Brother  21   .  Cancer - Lung  Mother     Social History:  reports that he quit smoking about 44 years ago. His smoking use included Cigarettes. He has a 6.5 pack-year smoking history. He has never used smokeless tobacco. He reports that he does not drink alcohol or use illicit drugs.  Allergies:  Allergies   Allergen  Reactions   .  Niaspan [Niacin Er]  Rash    Medications Prior to Admission   Medication  Sig  Dispense  Refill   .  amoxicillin-clavulanate (AUGMENTIN) 875-125 MG per tablet  Take 1 tablet by mouth 2 (two) times daily.  14 tablet  0   .  aspirin EC 81 MG tablet  Take 81 mg by mouth every evening.     .  Calcium Carbonate-Vitamin D (CALCIUM 500 + D PO)  Take 1 tablet by mouth daily.     .   cholecalciferol (VITAMIN D) 1000 UNITS tablet  Take 1,000 Units by mouth daily.     Marland Kitchen  dexamethasone (DECADRON) 4 MG tablet  Take 8 mg by mouth daily. Take 2 tablets twice daily for 2 days after chemotherapy treatment     .  fish oil-omega-3 fatty acids 1000 MG capsule  Take 1 g by mouth daily.     .  metoprolol tartrate (LOPRESSOR) 25 MG tablet  Take 25 mg by mouth every morning. verified that it is not XL     .  Multiple Vitamin (MULTIVITAMIN WITH MINERALS) TABS  Take 1 tablet by mouth daily.     .  Potassium 99 MG TABS  Take 1 tablet by mouth daily.     .  prasugrel (EFFIENT) 10 MG TABS tablet  Take 10 mg by mouth every evening.     .  predniSONE (DELTASONE) 20 MG tablet  Take 3 tablets (60 mg total) by mouth daily with breakfast.  30 tablet  0   .  rosuvastatin (CRESTOR) 10 MG tablet  Take 10 mg by mouth 2 (two) times a week. On Wednesday and Sunday     .  testosterone cypionate (DEPOTESTOTERONE CYPIONATE) 200 MG/ML injection  Inject into the muscle every 14 (fourteen) days.     .  valACYclovir (VALTREX) 1000 MG tablet  Take 500-1,000 mg by mouth daily. Take 2 tablets (1000 mg) twice daily for 7 days then 500 mg daily.     .  vitamin B-12 (CYANOCOBALAMIN) 500 MCG tablet  Take 500 mcg by mouth daily.     .  nitroGLYCERIN (NITROSTAT) 0.4 MG SL tablet  Place 1 tablet (0.4 mg total) under the tongue every 5 (five) minutes as needed for chest pain.  25 tablet  2   .  ondansetron (ZOFRAN ODT) 8 MG disintegrating tablet  Take 1 by mouth twice daily for 2 days to start day after chemotherapy treatment then 1 by mouth every 8 hours as needed for nausea or vomiting  20 tablet  6    Home:  Home Living  Family/patient expects to be discharged to:: Inpatient rehab  Living Arrangements: Spouse/significant other  Functional History:  Prior Function  Level of Independence: Independent  Functional Status:  Mobility:  Bed Mobility  Overal bed mobility: Modified Independent  General bed mobility comments:  pt moves ModI, but desats from 97% on 2L O2 to 92%.  Transfers  Overall transfer level: Needs assistance  Equipment used: 1 person hand held assist  Transfers: Sit to/from [facility]  Sit to Stand: Min assist  Stand pivot transfers: Min assist  General transfer comment: pt only able to  SPT to chair 2/2 SOB. cues for scooting to edge of bed prior to standing and use of UEs. pt desated from 92% on 2L O2 sitting EOB to 83% after SPT to chair. pt required >67mins and increasing O2 to 3L in order to bring sats back up to 90%. RN made aware.    ADL:   Cognition:  Cognition  Overall Cognitive Status: Within Functional Limits for tasks assessed  Cognition  Arousal/Alertness: Awake/alert  Behavior During Therapy: WFL for tasks assessed/performed  Overall Cognitive Status: Within Functional Limits for tasks assessed  Blood pressure 140/65, pulse 92, temperature 97.6 F (36.4 C), temperature source Oral, resp. rate 22, height 5\' 9"  (1.753 m), weight 60.056 kg (132 lb 6.4 oz), SpO2 98.00%.  Physical Exam  Constitutional: He is oriented to person, place, and time.  73 year old frail Caucasian male  HENT:  Head: Normocephalic.  Eyes: EOM are normal.  Neck: Normal range of motion. Neck supple. No thyromegaly present.  Cardiovascular: Normal rate and regular rhythm.  Respiratory:  Decreased breath sounds at the bases  GI: Soft. Bowel sounds are normal. He exhibits no distension.  Neurological: He is alert and oriented to person, place, and time.  motor strength is 4+/5 bilateral deltoid, bicep, tricep, grip, hip flexors, knee extensors, ankle dorsiflexion plantar flexion  Sensation is intact to light touch and proprioception bilateral upper and lower limbs  Patient moves slowly responses are delayed sometimes repetition as needed  Results for orders placed during the hospital encounter of 09/04/13 (from the past 24 hour(s))   GLUCOSE, CAPILLARY Status: Abnormal    Collection  Time    09/07/13 6:43 AM   Result  Value  Ref Range    Glucose-Capillary  138 (*)  70 - 99 mg/dL   BASIC METABOLIC PANEL Status: Abnormal    Collection Time    09/07/13 8:35 AM   Result  Value  Ref Range    Sodium  126 (*)  137 - 147 mEq/L    Potassium  4.4  3.7 - 5.3 mEq/L    Chloride  88 (*)  96 - 112 mEq/L    CO2  22  19 - 32 mEq/L    Glucose, Bld  178 (*)  70 - 99 mg/dL    BUN  25 (*)  6 - 23 mg/dL    Creatinine, Ser  1.51 (*)  0.50 - 1.35 mg/dL    Calcium  8.6  8.4 - 10.5 mg/dL    GFR calc non Af Amer  45 (*)  >90 mL/min    GFR calc Af Amer  52 (*)  >90 mL/min   CBC Status: Abnormal    Collection Time    09/07/13 8:35 AM   Result  Value  Ref Range    WBC  16.8 (*)  4.0 - 10.5 K/uL    RBC  3.10 (*)  4.22 - 5.81 MIL/uL    Hemoglobin  10.8 (*)  13.0 - 17.0 g/dL    HCT  31.0 (*)  39.0 - 52.0 %    MCV  100.0  78.0 - 100.0 fL    MCH  34.8 (*)  26.0 - 34.0 pg    MCHC  34.8  30.0 - 36.0 g/dL    RDW  18.5 (*)  11.5 - 15.5 %    Platelets  243  150 - 400 K/uL    Dg Chest 2 View  09/06/2013 CLINICAL DATA: Right-sided pneumothorax EXAM: CHEST 2 VIEW COMPARISON: Portable  chest of Sep 05, 2013. And barium esophagram of the same day FINDINGS: No pneumothorax visible. Small amounts of mediastinal air the in right perihilar region and in bilateral upper paratracheal region similar to prior. No pleural effusion. Minimal prominence of pulmonary interstitium. Cardiac silhouette and pulmonary vascularity within limits of normal. IMPRESSION: 1. No visible right pneumothorax. Pneumomediastinum persists. 2. Mild stable pulmonary interstitial edema. Electronically Signed By: David Martinique On: 09/06/2013 09:41  Dg Esophagus W/water Sol Cm  09/05/2013 CLINICAL DATA: Evaluate for esophageal rupture EXAM: ESOPHOGRAM/BARIUM SWALLOW TECHNIQUE: Single contrast examination was performed using water-soluble contrast. FLUOROSCOPY TIME: 55 seconds 50 cc Omnipaque 300 COMPARISON: 09/05/2013 FINDINGS: The esophagus is  patent. No stricture or mass identified. No abnormal extraluminal extravasation of contrast identified to suggest esophageal leak/ rupture. IMPRESSION: Negative exam. Electronically Signed By: Kerby Moors M.D. On: 09/05/2013 15:26   Assessment/Plan:  Diagnosis: Deconditioning after pneumonia in a patient with non-Hodgkin's lymphoma  1. Does the need for close, 24 hr/day medical supervision in concert with the patient's rehab needs make it unreasonable for this patient to be served in a less intensive setting? Yes 2. Co-Morbidities requiring supervision/potential complications: Chronic renal insufficiency, severe mitral regurgitation, coronary artery disease 3. Due to bladder management, bowel management, safety, skin/wound care, disease management, medication administration, pain management and patient education, does the patient require 24 hr/day rehab nursing? Yes 4. Does the patient require coordinated care of a physician, rehab nurse, PT (1-2 hrs/day, 5 days/week), OT (1-2 hrs/day, 5 days/week) and SLP (0.5-1 hrs/day, 5 days/week) to address physical and functional deficits in the context of the above medical diagnosis(es)? Yes Addressing deficits in the following areas: balance, endurance, locomotion, strength, transferring, bowel/bladder control, bathing, dressing, feeding, grooming, toileting and cognition 5. Can the patient actively participate in an intensive therapy program of at least 3 hrs of therapy per day at least 5 days per week? Yes 6. The potential for patient to make measurable gains while on inpatient rehab is excellent 7. Anticipated functional outcomes upon discharge from inpatient rehab are modified independent with PT, modified independent with OT, modified independent with SLP. 8. Estimated rehab length of stay to reach the above functional goals is: [facility] 12 days 9. Does the patient have adequate social supports to accommodate these discharge functional goals?  Yes 10. Anticipated D/C setting: Home 11. Anticipated post D/C treatments: [place] therapy 12. Overall Rehab/Functional Prognosis: good RECOMMENDATIONS:  This patient's condition is appropriate for continued rehabilitative care in the following setting: CIR  Patient has agreed to participate in recommended program. Yes  Note that insurance prior authorization may be required for reimbursement for recommended care.  Comment: May need dietary consult for poor appetite. May benefit from trial of Megace  09/07/2013

## 2013-09-12 NOTE — Progress Notes (Signed)
PMR Admission Coordinator Pre-Admission Assessment  Patient: Willie [name] is an 73 y.o., male  MRN: [id number]  DOB: [birth year]  Height: 5\' 9"  (175.3 cm)  Weight: 60.782 kg (134 lb)  Insurance Information  HMO: yes PPO: PCP: IPA: 80/20: OTHER: Medicare advantage plan  PRIMARY: Blue Medicare Policy#: [id number] Subscriber: pt  CM Name: Dallas Breeding Phone#: [phone] Fax#: [phone] approved for 7 days. Update on 07/20/57  Pre-Cert#: [id number] Employer: retired  Benefits: Phone #: [phone] Name: 09/12/13  Eff. Date: 04/20/13 Deduct: none Out of Pocket Max: $4500/met Life Max: none  CIR: $250 copay per day, days 1-6 SNF: no copay days 1-20; $60 copay days 21-100  Outpatient: $35 per visit Co-Pay: no visit limit  Home Health: 100% Co-Pay: none  DME: 80% Co-Pay: 20%  Providers: in network   SECONDARY: none  Medicaid Application Date: Case Manager:  Disability Application Date: Case Worker:  Emergency Warehouse manager Information     Name  Relation  Home  Work  [place]  Spouse  [phone]   [phone]     Lenoard Aden  Daughter  [phone]       Jojuan, Champney   [phone]          Current Medical History  Patient Admitting Diagnosis: Deconditioning after pneumonia in a patient with non-Hodgkin's lymphoma  History of Present Illness: Willie [name] is a 73 y.o. 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 No chest pain. Recent PET scan showed findings consistent with pneumonia. Patient developed low-grade fever and cough. Chest x-ray showed interval development of bilateral perihilar and basilar interstitial densities concerning for pulmonary edema or less likely pneumonia. CT angio of the chest showed no pulmonary emboli. Maintained on broad-spectrum antibiotics. Follow up hematology services in reference to non-Hodgkin's trauma. Findings of small right apical PTX and  monitored as well as pneumomediastinum. Follow cardiothoracic surgery in reference to pneumomediastinum with Gastrografin swallow completed showing no evidence of leak. No surgical therapy needed. Echocardiogram with ejection fraction of 60% no wall motion abnormalities. Patient placed on Lovenox for DVT prophylaxis. Hyponatremia 126 and monitored. Physical therapy evaluation 09/07/2013 for deconditioning noted patient tends to desaturate with activity. Recommendations made for physical medicine rehabilitation consult  Patient has been more confused since hospitalization according to his son.  Past Medical History    Past Medical History    Diagnosis  Date    .  Hypertension     .  History of DVT of lower extremity       06/2001 as first sign of lymphoma: enlarged left inguinal nodes    .  Low serum testosterone level  11/10/2011    .  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  Nocturia  11/10/2011    .  Normochromic anemia  11/10/2011      Likely result of prior high dose chemotherapy    .  Unexplained weight loss  05/03/2012    .  Mitral valvular regurgitation  06/01/12      moderate to severe by echo- asymptomatic    .  Dyslipidemia     .  RBBB     .  Coronary artery disease     .  Family history of anesthesia complication       "son, Margreta Journey, larynx collapses" (10/11/2012)    .  Heart murmur     .  History of blood transfusion  2009-2010      "both related to his chemo" (10/11/2012)    .  Chronic renal insufficiency, stage III (moderate)     .  Non Hodgkin's lymphoma  2003; 2009      "chemo; stem cell transplant & high powered chemo" (10/10/2012)    .  Renal insufficiency     .  Deep inguinal pain, right  05/26/2013    .  Conjunctivitis, acute, right eye  06/28/2013     Family History  family history includes Cancer - Lung in his mother; Heart failure in his father; Leukemia (age of onset: 48) in his brother.  Prior Rehab/Hospitalizations: none  Current Medications  Current  facility-administered medications:0.9 % sodium chloride infusion, 250 mL, Intravenous, PRN, Delfina Redwood, MD; acetaminophen (TYLENOL) suppository 650 mg, 650 mg, Rectal, Q6H PRN, Delfina Redwood, MD; acetaminophen (TYLENOL) tablet 650 mg, 650 mg, Oral, Q6H PRN, Delfina Redwood, MD, 650 mg at 09/11/13 0031  albuterol (PROVENTIL) (2.5 MG/3ML) 0.083% nebulizer solution 2.5 mg, 2.5 mg, Nebulization, Q2H PRN, Delfina Redwood, MD; alum & mag hydroxide-simeth (MAALOX/MYLANTA) 200-200-20 MG/5ML suspension 30 mL, 30 mL, Oral, Q6H PRN, Delfina Redwood, MD; aspirin EC tablet 81 mg, 81 mg, Oral, QPM, Delfina Redwood, MD, 81 mg at 09/11/13 1725  atorvastatin (LIPITOR) tablet 20 mg, 20 mg, Oral, Once per day on Sun Wed, Corinna L Sullivan, MD, 20 mg at 09/10/13 1701; cholecalciferol (VITAMIN D) tablet 1,000 Units, 1,000 Units, Oral, Daily, Delfina Redwood, MD, 1,000 Units at 09/12/13 1020; cyanocobalamin tablet 500 mcg, 500 mcg, Oral, Daily, Delfina Redwood, MD, 500 mcg at 09/12/13 1020  docusate sodium (COLACE) capsule 100 mg, 100 mg, Oral, BID, Kelvin Cellar, MD, 100 mg at 09/12/13 1019; enoxaparin (LOVENOX) injection 40 mg, 40 mg, Subcutaneous, Q24H, Delfina Redwood, MD, 40 mg at 09/11/13 2150; HYDROcodone-acetaminophen (NORCO/VICODIN) 5-325 MG per tablet 1-2 tablet, 1-2 tablet, Oral, Q4H PRN, Delfina Redwood, MD  metoprolol tartrate (LOPRESSOR) tablet 25 mg, 25 mg, Oral, q morning - 10a, Delfina Redwood, MD, 25 mg at 09/12/13 1019; multivitamin with minerals tablet 1 tablet, 1 tablet, Oral, Daily, Delfina Redwood, MD, 1 tablet at 09/12/13 1019; nitroGLYCERIN (NITROSTAT) SL tablet 0.4 mg, 0.4 mg, Sublingual, Q5 min PRN, Delfina Redwood, MD  ondansetron [facility]) injection 4 mg, 4 mg, Intravenous, Q6H PRN, Delfina Redwood, MD, 4 mg at 09/10/13 0827; ondansetron (ZOFRAN) tablet 4 mg, 4 mg, Oral, Q6H PRN, Delfina Redwood, MD; oxymetazoline (AFRIN) 0.05 % nasal spray 1 spray, 1  spray, Each Nare, BID, Rhetta Mura Schorr, NP, 1 spray at 09/12/13 1019; prasugrel (EFFIENT) tablet 10 mg, 10 mg, Oral, QPM, Delfina Redwood, MD, 10 mg at 09/11/13 1725  predniSONE (DELTASONE) tablet 60 mg, 60 mg, Oral, Q breakfast, Kelvin Cellar, MD, 60 mg at 09/12/13 4098; senna-docusate (Senokot-S) tablet 1 tablet, 1 tablet, Oral, QHS PRN, Delfina Redwood, MD; sodium chloride 0.9 % injection 3 mL, 3 mL, Intravenous, Q12H, Delfina Redwood, MD, 3 mL at 09/11/13 1005; sodium chloride 0.9 % injection 3 mL, 3 mL, Intravenous, PRN, Delfina Redwood, MD  sodium chloride tablet 1 g, 1 g, Oral, TID WC, Kelvin Cellar, MD, 1 g at 09/11/13 1725; valACYclovir (VALTREX) tablet 500 mg, 500 mg, Oral, Daily, Delfina Redwood, MD, 500 mg at 09/12/13 1020  Patients Current Diet: Cardiac  Precautions / Restrictions  Precautions  Precautions: Fall  Precaution Comments: Respiratory Sats  Restrictions  Weight Bearing Restrictions: No  Prior Activity Level  Limited Community (1-2x/wk): working fulltimeuntil february when began chemo. Was completely independent up until one week pta. 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, international aid / [place] Devices/Equipment: Eyeglasses  Prior Functional Level  Prior Function  Level of Independence: Independent  Current Functional Level    Cognition  Overall Cognitive Status: Within Functional Limits for tasks assessed    Extremity Assessment  (includes Sensation/Coordination)      ADLs  Overall ADL's : Needs assistance/impaired  Eating/Feeding: Sitting;Supervision/ safety  Grooming: Applying deodorant;Wash/dry face;Standing;Sitting;Min guard;Oral care (simulated oral care)  Upper Body Bathing: Sitting;Set up;Supervision/ safety  Lower Body Bathing: Sit to/from stand;Minimal assistance  Upper Body Dressing : Set up;Supervision/safety;Sitting  Lower Body Dressing: Sit to/from  stand;Moderate assistance  Toilet Transfer: Min guard;Ambulation;RW;BSC  Toileting- Clothing Manipulation and Hygiene: Sit to/from stand;Min guard  Functional mobility during ADLs: Min guard;Rolling walker  General ADL Comments: Pt performed part of LB bathing and participated in some dressing with encouragement. Pt requiring quite a bit of rest breaks. Educated on deep breathing technique. Discussed energy conservation and how participating helps increase activity tolerance.    Mobility  Overal bed mobility: Modified Independent  General bed mobility comments: pt in chair    Transfers  Overall transfer level: Needs assistance  Equipment used: Rolling walker (2 wheeled)  Transfers: Sit to/from Stand  Sit to Stand: Min guard  Stand pivot transfers: Min assist  General transfer comment: cues for hand placement    Ambulation / Gait / Stairs / Wheelchair Mobility  Ambulation/Gait  Ambulation/Gait assistance: Fish farm manager (Feet): 50 Feet (20 rest 30)  Assistive device: Rolling walker (2 wheeled)  Gait Pattern/deviations: Step-through pattern;Decreased stride length;Narrow base of support  Gait velocity: decreased  Gait velocity interpretation: Below normal speed for age/gender  General Gait Details: pt required sitting rest break to progress mobility; pt ambulating on 3L of O2 initially; pt with SOB and labored breathing, bumped up to 4L O2 for second ambulation trial and pt able to increase mobility; cues for upright posture and RW adjusted to proper height    Posture / Balance     Special needs/care consideration  Oxygen o2 at 2 liters [place]. New this admit  Bowel mgmt: continent  Bladder mgmt: urinal continent  Diabetic mgmt no    Previous Home Environment  Living Arrangements: Spouse/significant other  Lives With: Spouse  Available Help at Discharge: Family;Available 24 hours/day  Type of Home: House  Home Layout: One level  Home Access: Stairs to enter  Entrance  Stairs-Rails: None  Entrance Stairs-Number of Steps: 1 step  Bathroom Shower/Tub: Wellsite geologist: Standard  Bathroom Accessibility: Yes  How Accessible: Accessible via walker  Home Care Services: No  Additional Comments: Has tub/shower and regular commode with sink close by  Discharge Living Setting  Plans for Discharge Living Setting: Patient's home;Lives with (comment);Other (Comment) (wife)  Type of Home at Discharge: House  Discharge Home Layout: One level  Discharge Home Access: Stairs to enter  Entrance Stairs-Rails: None  Entrance Stairs-Number of Steps: 1 step  Discharge Bathroom Shower/Tub: Tub/shower unit;Curtain  Discharge Bathroom Toilet: Standard  Discharge Bathroom Accessibility: Yes  How Accessible: Accessible via walker  Does the patient have any problems obtaining your medications?: No  Social/Family/Support Systems  Patient Roles: Spouse;Parent  Contact Information: Scotti Kosta, wife  Anticipated Caregiver: wife  Anticipated Caregiver's Contact Information: see above  Ability/Limitations of Caregiver: no limitations, she is retired  Careers adviser: 24/7  Discharge Plan Discussed with Primary Caregiver: Yes  Is Caregiver In Agreement with Plan?: Yes  Does Caregiver/Family have Issues with Lodging/Transportation while Pt is in Rehab?: No (wife stays with pt 24/7 in hospital)  Goals/Additional Needs  Patient/Family Goal for Rehab: Mod I to supervision with PT, OT, and SLP  Expected length of stay: ELOS 8 to 12 day  Pt/Family Agrees to Admission and willing to participate: Yes  Program Orientation Provided & Reviewed with Pt/Caregiver Including Roles & Responsibilities: Yes  Additional Information Needs: pt was not on O2 pta  Decrease burden of Care through IP rehab admission: n/a  Possible need for SNF placement upon discharge:no  Patient Condition: This pt's medical and functional status remains the same since consultation  09/07/2013 in which the Rehabilitation Physician determined and documented that the patient's condition is appropriate for intensive rehabilitative care in an inpatient rehabilitation facility. See History of present Illness for medical update. Functional changes are overall min assist. Patient's medical and functional status update has been discussed with the Rehabilitation physician and patient remains appropriate for inpt rehab. Will admit to inptrehab today.  Preadmission Screen Completed By: Cleatrice Burke, 09/12/2013 1:53 PM  ______________________________________________________________________  Discussed status with Dr. Naaman Plummer on 09/12/13 at 1352 and received telephone approval for admission today.  Admission Coordinator: Cleatrice Burke, time 2376 Date 09/12/2013.    Cosigned by: Meredith Staggers, MD [09/12/2013 2:01 PM]

## 2013-09-13 ENCOUNTER — Inpatient Hospital Stay (HOSPITAL_COMMUNITY): Payer: Medicare Other | Admitting: Speech Pathology

## 2013-09-13 ENCOUNTER — Inpatient Hospital Stay (HOSPITAL_COMMUNITY): Payer: Medicare Other | Admitting: *Deleted

## 2013-09-13 ENCOUNTER — Inpatient Hospital Stay (HOSPITAL_COMMUNITY): Payer: Medicare Other | Admitting: Occupational Therapy

## 2013-09-13 ENCOUNTER — Telehealth: Payer: Self-pay | Admitting: *Deleted

## 2013-09-13 DIAGNOSIS — N183 Chronic kidney disease, stage 3 unspecified: Secondary | ICD-10-CM

## 2013-09-13 DIAGNOSIS — C8445 Peripheral T-cell lymphoma, not classified, lymph nodes of inguinal region and lower limb: Secondary | ICD-10-CM

## 2013-09-13 DIAGNOSIS — R5381 Other malaise: Secondary | ICD-10-CM

## 2013-09-13 DIAGNOSIS — I2510 Atherosclerotic heart disease of native coronary artery without angina pectoris: Secondary | ICD-10-CM

## 2013-09-13 NOTE — Care Management Note (Signed)
Inpatient [facility] Individual Statement of Services  Patient Name:  Willie [name]  Date:  09/13/2013  Welcome to the [facility].  Our goal is to provide you with an individualized program based on your diagnosis and situation, designed to meet your specific needs.  With this comprehensive rehabilitation program, you will be expected to participate in at least 3 hours of rehabilitation therapies Monday-Friday, with modified therapy programming on the weekends.  Your rehabilitation program will include the following services:  Physical Therapy (PT), Occupational Therapy (OT), 24 hour per day rehabilitation nursing, Therapeutic Recreaction (TR), Case Management (Social Worker), Rehabilitation Medicine, Nutrition Services and Pharmacy Services  Weekly team conferences will be held on Tuesdays to discuss your progress.  Your Social Worker will talk with you frequently to get your input and to update you on team discussions.  Team conferences with you and your family in attendance may also be held.  Expected length of stay: 8-10 days  Overall anticipated outcome: supervision/ modified independent  Depending on your progress and recovery, your program may change. Your Social Worker will coordinate services and will keep you informed of any changes. Your Social Worker's name and contact numbers are listed  below.  The following services may also be recommended but are not provided by the [facility] will be made to provide these services after discharge if needed.  Arrangements include referral to agencies that provide these services.  Your insurance has been verified to be:  [employer] Your primary doctor is:  Dr. Shelia Media  Pertinent information will be shared with your doctor and your insurance  company.  Social Worker:  Lennart Pall, [place] or ([phone]   Information discussed with and copy given to patient by: Lennart Pall, 09/13/2013, 3:12 PM

## 2013-09-13 NOTE — Progress Notes (Signed)
Social Work  Social Work Assessment and Plan  Patient Details  Name: Willie [name] MRN: [id number] Date of Birth: 1940/07/17  Today's Date: 09/13/2013  Problem List:  Patient Active Problem List   Diagnosis Date Noted  . Physical deconditioning 09/12/2013  . Hyponatremia 09/04/2013  . Pneumomediastinum 09/04/2013  . Pneumothorax on right 09/04/2013  . Pneumonia 09/04/2013  . Lymphoma 09/04/2013  . Conjunctivitis, acute, right eye 06/28/2013  . Bronchitis, chronic 06/28/2013  . Deep inguinal pain, right 05/26/2013  . CAD (coronary artery disease) 10/10/2012  . Abnormal nuclear cardiac imaging test 10/02/2012  . RBBB   . Hypertension   . Severe mitral regurgitation 09/08/2012  . Unexplained weight loss 05/03/2012  . Chronic renal insufficiency, stage III (moderate) 11/10/2011  . 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 History of DVT of lower extremity 11/10/2011  . Low serum testosterone level 11/10/2011  . 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 Nocturia 11/10/2011  . Normochromic Anemia 11/10/2011  . 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 Hypertension   . History of DVT of lower extremity     06/2001 as first sign of lymphoma: enlarged left inguinal nodes  . Low serum testosterone level 11/10/2011  . 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 Nocturia 11/10/2011  . Normochromic anemia 11/10/2011    Likely result of prior high dose chemotherapy  . Unexplained weight loss 05/03/2012  . Mitral valvular regurgitation 06/01/12    moderate to severe by echo- asymptomatic  . Dyslipidemia   . RBBB   . Coronary artery disease   . Family history of anesthesia complication     "son, Margreta Journey, larynx collapses" (10/11/2012)  . Heart murmur   . History of blood transfusion 2009-2010    "both related to his chemo" (10/11/2012)  . Chronic renal insufficiency, stage III (moderate)   . Non  Hodgkin's lymphoma 2003; 2009    "chemo; stem cell transplant & high powered chemo" (10/10/2012)  . Renal insufficiency   . Deep inguinal pain, right 05/26/2013  . Conjunctivitis, acute, right eye 06/28/2013   Past Surgical History:  Past Surgical History  Procedure Laterality Date  . Bone marrow transplant  2010  . Cardiac catheterization  10/03/2012; 2007  . Coronary angioplasty with stent placement  10/11/2012    "2" (10/11/2012)  . Tonsillectomy  1950's  . Cataract extraction w/ intraocular lens implant Left 1990's  . Coronary artery bypass graft  03/29/2006    "CABG X5" (10/11/2012)   Social History:  reports that he quit smoking about 44 years ago. His smoking use included Cigarettes. He has a 6.5 pack-year smoking history. He has never used smokeless tobacco. He reports that he does not drink alcohol or use illicit drugs.  Family / Support Systems Marital Status: Married Patient Roles: Spouse;Parent Spouse/Significant Other: wife, Vi Whitesel @ (H) [phone] or [phone] Children: daughter, Amy Schmukler @ (C) [phone] Emilie Rutter) and son, Ezekeil Bethel @ (C) [phone] ([place]) Anticipated Caregiver: wife Ability/Limitations of Caregiver: no limitations, she is retired Careers adviser: 24/7 Family Dynamics: wife very attentive and supportive - denies any concerns about providing needed assistance to pt upon d/c  Social History Preferred language: English Religion: Christian Cultural Background: NA Read: Yes Write: Yes Employment Status: Employed Name of Employer: [employer]. 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  Attempts to answer questions as he is able but only offering short answers.   Looks to wife to add informtion.  Denies any emotional distress.  Looking forward to doing therapies and hopeful he can rebuild his strength. Recent Psychosocial Issues: Return on Non-Hodgkins lymphoma in Jan and start of chemo in March. Pyschiatric History: None Substance Abuse History: None  Patient / Family Perceptions, Expectations & Goals Pt/Family understanding of illness & functional limitations: pt and wife with good understanding of patient overall functional decline and limitations/ need for therapies. Premorbid pt/family roles/activities: Pt was working f/t until the beg of March and completely independent. Anticipated changes in roles/activities/participation: Wife to continue to providing caregiver support as she has for the past few weeks/. Pt/family expectations/goals: Pt's short term goal is to be able to wean off of O2 and longer term is to return to his job  [employer]: None Premorbid Home Care/DME Agencies: None Transportation available at discharge: yes  Discharge Planning Living Arrangements: Spouse/significant other Support Systems: Spouse/significant other;Children Type of Residence: Private residence Insurance underwriter Resources: [employer] Museum/gallery curator Resources: Employment;Social Security Financial Screen Referred: No Living Expenses: Own Money Management: Spouse Does the patient have any problems obtaining your medications?: No Home Management: mostly falling to wife Patient/Family Preliminary Plans: Pt to return home with wife providing 24/7 assistance Social Work Anticipated Follow Up Needs: HH/OP Expected length of stay: ELOS 8 to 12 day  Clinical Impression Very frail appearing gentleman here with pneumonia in addition to currently  going through chemo for (return of ) Non-Hodgkins pneumonia.  Wife very attentive and plans to provide 24/7 care upon d/c. Hopes to regain overall strength and eventually be able to return to job.  Lennart Pall 09/13/2013, 3:37 PM

## 2013-09-13 NOTE — Progress Notes (Signed)
[facility]ILIT[facility]     PROGRESS NOTE    Subjective/Complaints: Had his best night of sleep. Denies pain. Denies sob. Good appetite. A 12 point review of systems has been performed and if not noted above is otherwise negative.   Objective: Vital Signs: Blood pressure 120/66, pulse 104, temperature 97.3 F (36.3 C), temperature source Oral, resp. rate 18, weight 63.163 kg (139 lb 4 oz), SpO2 96.00%. No results found.  Recent Labs  09/11/13 0506 09/12/13 1930  WBC 20.6* 16.6*  HGB 9.5* 9.0*  HCT 26.2* 25.3*  PLT 208 220    Recent Labs  09/11/13 0506 09/12/13 0856 09/12/13 1930  NA 124* 124*  --   K 4.4 4.8  --   CL 90* 87*  --   GLUCOSE 98 109*  --   BUN 39* 44*  --   CREATININE 1.65* 1.70* 2.05*  CALCIUM 9.1 10.3  --    CBG (last 3)  No results found for this basename: GLUCAP,  in the last 72 hours  Wt Readings from Last 3 Encounters:  09/13/13 63.163 kg (139 lb 4 oz)  09/10/13 60.782 kg (134 lb)  08/30/13 60.51 kg (133 lb 6.4 oz)    Physical Exam:   Constitutional: He is oriented to person, place, and time.  73 year old frail Caucasian male  HENT: mucosa pink/moist, tongue a little dry  Head: Normocephalic.  Eyes: EOM are normal.  Neck: Normal range of motion. Neck supple. No thyromegaly present.  Cardiovascular: Normal rate and regular rhythm. Systolic murmur  Respiratory:  Decreased breath sounds at the bases. Seems to be using accessory muscles to breathe at times GI: Soft. Bowel sounds are normal. He exhibits no distension.  Neurological: He is alert and oriented to person, place, and time.  motor strength is 4+/5 bilateral deltoid, bicep, tricep, grip, hip flexors, knee extensors, ankle dorsiflexion plantar flexion  Sensation is intact to light touch and proprioception bilateral upper and lower limbs  Patient moves with delay, needs repeated cueing at times.  Psych: affect flat, cooperative. Wife answers for him  frequently   Assessment/Plan: 1. Functional deficits secondary to deconditioning after pneumonia/respiratory failure which require 3+ hours per day of interdisciplinary therapy in a comprehensive inpatient rehab setting. Physiatrist is providing close team supervision and 24 hour management of active medical problems listed below. Physiatrist and rehab team continue to assess barriers to discharge/monitor patient progress toward functional and medical goals. FIM:                   Comprehension Comprehension Mode: Auditory Comprehension: 5-Understands complex 90% of the time/Cues < 10% of the time  Expression Expression Mode: Verbal Expression: 4-Expresses basic 75 - 89% of the time/requires cueing 10 - 24% of the time. Needs helper to occlude trach/needs to repeat words.  Social Interaction Social Interaction: 5-Interacts appropriately 90% of the time - Needs monitoring or encouragement for participation or interaction.  Problem Solving Problem Solving: 5-Solves basic problems: With no assist  Memory Memory: 5-Recognizes or recalls 90% of the time/requires cueing < 10% of the time Medical Problem List and Plan:  1. Functional deficits secondary to deconditioning after pneumonia, respiratory failure with history of non-Hodgkin's lymphoma  2. DVT Prophylaxis/Anticoagulation: Subcutaneous Lovenox. Monitor platelet counts any signs of bleeding  3. Pain Management: Hydrocodone as needed. Monitor with increased mobility  4. Mood: Bouts of confusion discussed baseline with family  5. Neuropsych: This patient is not capable of making decisions on  his own behalf.  6. Non-Hodgkin's lymphoma. Followup per Dr.Gorsuch. 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 Chemotherapy program to be reevaluated as outpatient   -labs stable yesterday 7. Hypertension. Lopressor 25 mg daily. Monitor with increased mobility  8. CAD with CABG. No chest pain or shortness  of breath. Continue Effient 10 mg daily as well as aspirin  9. Hyperlipidemia. Lipitor  10. Chronic renal insufficiency. Baseline creatinine 1.51. Followup labs  11. 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 Sodium 124-126. Continue sodium chloride tablets. Followup labs.  -Fluid restriction      LOS (Days) 1 A FACE TO FACE EVALUATION WAS PERFORMED  Meredith Staggers 09/13/2013 8:06 AM

## 2013-09-13 NOTE — Telephone Encounter (Signed)
Left Vm for wife to inform pt's appts here this week are canceled since she informed us pt still in hospital. Asked her to let us know when he is being d/c'd back home and we will r/s him for f/u w/ Dr. Alvy Bimler at that time.

## 2013-09-13 NOTE — Evaluation (Signed)
Physical Therapy Assessment and Plan  Patient Details  Name: Willie [name] MRN: [id number] Date of Birth: 01/29/1941  PT Diagnosis: Difficulty walking, Edema and Muscle weakness Rehab Potential: Good ELOS: 7-10 days   Today's Date: 09/13/2013 Time: 0830-0930 Time Calculation (min): 60 min  Problem List:  Patient Active Problem List   Diagnosis Date Noted  . Physical deconditioning 09/12/2013  . Hyponatremia 09/04/2013  . Pneumomediastinum 09/04/2013  . Pneumothorax on right 09/04/2013  . Pneumonia 09/04/2013  . Lymphoma 09/04/2013  . Conjunctivitis, acute, right eye 06/28/2013  . Bronchitis, chronic 06/28/2013  . Deep inguinal pain, right 05/26/2013  . CAD (coronary artery disease) 10/10/2012  . Abnormal nuclear cardiac imaging test 10/02/2012  . RBBB   . Hypertension   . Severe mitral regurgitation 09/08/2012  . Unexplained weight loss 05/03/2012  . Chronic renal insufficiency, stage III (moderate) 11/10/2011  . 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 History of DVT of lower extremity 11/10/2011  . Low serum testosterone level 11/10/2011  . 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 Nocturia 11/10/2011  . Normochromic Anemia 11/10/2011  . 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 Hypertension   . History of DVT of lower extremity     06/2001 as first sign of lymphoma: enlarged left inguinal nodes  . Low serum testosterone level 11/10/2011  . 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 Nocturia 11/10/2011  . Normochromic anemia 11/10/2011    Likely result of prior high dose chemotherapy  . Unexplained weight loss 05/03/2012  . Mitral valvular regurgitation 06/01/12    moderate to severe by echo- asymptomatic  . Dyslipidemia   . RBBB   . Coronary artery disease   . Family history of anesthesia complication     "son, Kegan, larynx collapses" (10/11/2012)  . Heart murmur   .  History of blood transfusion 2009-2010    "both related to his chemo" (10/11/2012)  . Chronic renal insufficiency, stage III (moderate)   . Non Hodgkin's lymphoma 2003; 2009    "chemo; stem cell transplant & high powered chemo" (10/10/2012)  . Renal insufficiency   . Deep inguinal pain, right 05/26/2013  . Conjunctivitis, acute, right eye 06/28/2013   Past Surgical History:  Past Surgical History  Procedure Laterality Date  . Bone marrow transplant  2010  . Cardiac catheterization  10/03/2012; 2007  . Coronary angioplasty with stent placement  10/11/2012    "2" (10/11/2012)  . Tonsillectomy  1950's  . Cataract extraction w/ intraocular lens implant Left 1990's  . Coronary artery bypass graft  03/29/2006    "CABG X5" (10/11/2012)    Assessment & Plan Clinical Impression: Willie [name] is a 73 y.o. right-handed male with history of CAD with CABG, non-Hodgkin's lymphoma on chemotherapy who presents 09/04/2013 with shortness of breath. No chest pain. Recent PET scan showed findings consistent with pneumonia. Patient developed low-grade fever and cough. Chest x-ray showed interval development of bilateral perihilar and basilar interstitial densities concerning for pulmonary edema or less likely pneumonia. CT angio of the chest showed no pulmonary emboli. Maintained on broad-spectrum antibiotics and since been completed. Follow up hematology services in reference to non-Hodgkin's lymphoma. Findings of small right apical PTX and monitored as well as pneumomediastinum versus chemotherapy related chemical pneumonitis and maintained on steroids. Follow cardiothoracic surgery in reference to pneumomediastinum with Gastrografin swallow completed showing no evidence of leak. No surgical   therapy needed. Echocardiogram with ejection fraction of 60% no wall motion abnormalities. Patient placed on Lovenox for DVT prophylaxis. Hyponatremia 124- 126 and monitored with urine osmolality 409. Physical and occupational  therapy evaluations 09/07/2013 for deconditioning noted patient tends to desaturate with activity. Recommendations made for physical medicine rehabilitation consult. Patient was admitted for comprehensive rehabilitation program. Patient transferred to CIR on 09/12/2013 .   Patient currently requires min with mobility secondary to muscle weakness, decreased cardiorespiratoy endurance and decreased oxygen support, delayed processing and decreased standing balance and decreased balance strategies.  Prior to hospitalization, patient was independent  with mobility and lived with Spouse in a House home.  Home access is 1 stepStairs to enter.  Patient will benefit from skilled PT intervention to maximize safe functional mobility, minimize fall risk and decrease caregiver burden for planned discharge home with 24 hour supervision.  Anticipate patient will benefit from follow up [place] at discharge.  PT - End of Session Activity Tolerance: Tolerates 30+ min activity with multiple rests Endurance Deficit: Yes Endurance Deficit Description: fatigues quickly, requires frequent rest breaks due to deconditioning and respiratory status. PT Assessment Rehab Potential: Good PT Patient demonstrates impairments in the following area(s): Balance;Endurance;Motor;Safety PT Transfers Functional Problem(s): Bed Mobility;Bed to Chair;Car;Furniture PT Locomotion Functional Problem(s): Ambulation;Wheelchair Mobility;Stairs PT Plan PT Intensity: Minimum of 1-2 x/day ,45 to 90 minutes PT Frequency: 5 out of 7 days PT Duration Estimated Length of Stay: 7-10 days PT Treatment/Interventions: Ambulation/gait training;Balance/vestibular training;Community reintegration;Discharge planning;Neuromuscular re-education;Functional mobility training;DME/adaptive equipment instruction;Disease management/prevention;Pain Landscape architect;Wheelchair propulsion/positioning;Therapeutic Activities;Patient/family education;Psychosocial  support;Therapeutic Exercise;UE/LE Strength taining/ROM;UE/LE Coordination activities PT Transfers Anticipated Outcome(s): mod I PT Locomotion Anticipated Outcome(s): mod I household, supervision stairs, supervision community ambulation PT Recommendation Recommendations for Other Services: Neuropsych consult Follow Up Recommendations: Home health PT Patient destination: Home Equipment Recommended: Rolling walker with 5" wheels;Wheelchair (measurements);Wheelchair cushion (measurements);To be determined Equipment Details: Patient does not own any DME; Further recommendations TBD upon discharge  Skilled Therapeutic Intervention Skilled therapeutic intervention initiated after completion of evaluation. Discussed falls risk, safety within room, and focus of therapy during stay. Discussed possible LOS, goals, and f/u therapy. Vitals during functional mobility: -s/p wheelchair mobility x60': SpO2 85% on 3L, HR 122; 90" to return to >90% with pursed lip breathing -s/p ambulation x26': SpO2 79% on 4L, HR 133; 4 min to return to 85%, required 6L via [place] to return >90% with pursed lip breathing -s/p negotiation of one step: SpO2 86% on 4L, HR 127; 75" to return to >90% with pursed lip breathing  RN aware of SpO2 levels.  PT Evaluation Precautions/Restrictions Precautions Precautions: Fall Precaution Comments: Respiratory Sats Restrictions Weight Bearing Restrictions: No General Chart Reviewed: Yes Family/Caregiver Present: Yes (wife)  Vital SignsTherapy Vitals Pulse Rate: 112 Patient Position (if appropriate): Sitting Oxygen Therapy SpO2: 95 % O2 Device: Nasal cannula O2 Flow Rate (L/min): 0.5 L/min Pulse Oximetry Type: Intermittent Pain Pain Assessment Pain Assessment: No/denies pain Pain Score: 0-No pain Home Living/Prior Functioning Home Living Available Help at Discharge: Family;Available 24 hours/day Type of Home: House Home Access: Stairs to enter [employer] of  Steps: 1 step Entrance Stairs-Rails: None Home Layout: One level  Lives With: Spouse Prior Function Level of Independence: Independent with gait;Independent with transfers  Able to Take Stairs?: Yes Driving: Yes Vocation: Full time employment Vocation Requirements: Was working full time until February when chemo treatments started Vision/Perception    Wears glasses all the time; No change in vision from baseline; No perceived visual deficits; Praxis/Perception WFL Cognition Overall  Cognitive Status: Within Functional Limits for tasks assessed Arousal/Alertness: Awake/alert Orientation Level: Oriented X4 Memory: Appears intact Awareness: Appears intact Safety/Judgment: Appears intact Sensation Sensation Light Touch: Appears Intact Proprioception: Appears Intact Coordination Gross Motor Movements are Fluid and Coordinated: Yes Fine Motor Movements are Fluid and Coordinated: Yes Motor  Motor Motor: Within Functional Limits  Mobility Bed Mobility Bed Mobility: Supine to Sit Supine to Sit: 5: Supervision;HOB flat;With rails Supine to Sit Details: Verbal cues for sequencing Transfers Transfers: Yes Sit to Stand: 4: Min assist;From bed;From chair/3-in-1;With armrests;With upper extremity assist Sit to Stand Details: Verbal cues for sequencing;Verbal cues for technique;Verbal cues for precautions/safety;Manual facilitation for weight shifting Stand to Sit: 4: Min assist;With armrests;With upper extremity assist;To chair/3-in-1 Stand to Sit Details (indicate cue type and reason): Verbal cues for sequencing;Manual facilitation for weight shifting;Verbal cues for precautions/safety;Verbal cues for technique Stand Pivot Transfers: 4: Min assist;With armrests Stand Pivot Transfer Details: Verbal cues for sequencing;Manual facilitation for weight shifting;Verbal cues for precautions/safety;Verbal cues for technique Locomotion  Ambulation Ambulation: Yes Ambulation/Gait Assistance: 4:  Min guard Ambulation Distance (Feet): 26 Feet Assistive device: Rolling walker Gait Gait: Yes Gait Pattern: Impaired Gait Pattern: Step-through pattern;Decreased stride length;Trunk flexed Stairs / Additional Locomotion Stairs: Yes Stairs Assistance: 4: Min assist Stairs Assistance Details: Verbal cues for sequencing;Verbal cues for technique;Verbal cues for precautions/safety Stairs Assistance Details (indicate cue type and reason): Ascends forwards, descends backwards Stair Management Technique: Two rails;Step to pattern;Forwards;Backwards Number of Stairs: 1 Height of Stairs: 4 Wheelchair Mobility Wheelchair Mobility: Yes Wheelchair Assistance: 4: Min assist Wheelchair Assistance Details: Verbal cues for sequencing;Verbal cues for technique;Verbal cues for precautions/safety;Tactile cues for sequencing Wheelchair Propulsion: Both upper extremities Wheelchair Parts Management: Needs assistance Distance: 60  Trunk/Postural Assessment  Cervical Assessment Cervical Assessment: Within Functional Limits (forward head posture) Thoracic Assessment Thoracic Assessment: Within Functional Limits (kyphotic posture) Lumbar Assessment Lumbar Assessment: Within Functional Limits Postural Control Postural Control: Within Functional Limits  Balance Balance Balance Assessed: Yes Static Sitting Balance Static Sitting - Balance Support: No upper extremity supported;Feet supported;Bilateral upper extremity supported Static Sitting - Level of Assistance: 5: Stand by assistance Dynamic Sitting Balance Dynamic Sitting - Balance Support: No upper extremity supported;Feet supported;During functional activity Dynamic Sitting - Level of Assistance: 5: Stand by assistance Dynamic Sitting - Balance Activities: Forward lean/weight shifting Static Standing Balance Static Standing - Balance Support: Bilateral upper extremity supported;During functional activity Static Standing - Level of Assistance: 4:  Min assist Extremity Assessment  RLE Assessment RLE Assessment: Exceptions to WFL RLE Strength RLE Overall Strength: Deficits RLE Overall Strength Comments: Grossly 3+/5 LLE Assessment LLE Assessment: Exceptions to WFL LLE Strength LLE Overall Strength: Deficits LLE Overall Strength Comments: Grossly 3+/5  FIM:  FIM - Bed/Chair Transfer Bed/Chair Transfer Assistive Devices: Bed rails;Arm rests Bed/Chair Transfer: 5: Supine > Sit: Supervision (verbal cues/safety issues);4: Bed > Chair or W/C: Min A (steadying Pt. > 75%);4: Chair or W/C > Bed: Min A (steadying Pt. > 75%) FIM - Locomotion: Wheelchair Distance: 60 Locomotion: Wheelchair: 2: Travels 50 - 149 ft with minimal assistance (Pt.>75%) FIM - Locomotion: Ambulation Locomotion: Ambulation Assistive Devices: Walker - Rolling Ambulation/Gait Assistance: 4: Min guard Locomotion: Ambulation: 1: Travels less than 50 ft with minimal assistance (Pt.>75%) FIM - Locomotion: Stairs Locomotion: Stairs Assistive Devices: Hand rail - 2 Locomotion: Stairs: 1: Up and Down < 4 stairs with minimal assistance (Pt.>75%)   Refer to Care Plan for Long Term Goals  Recommendations for other services: Neuropsych  Discharge Criteria: Patient will be   discharged from PT if patient refuses treatment 3 consecutive times without medical reason, if treatment goals not met, if there is a change in medical status, if patient makes no progress towards goals or if patient is discharged from hospital.  The above assessment, treatment plan, treatment alternatives and goals were discussed and mutually agreed upon: by patient and by family  Bridget S Ripa Bridget S. Ripa, PT, DPT 09/13/2013, 9:50 AM  

## 2013-09-13 NOTE — Progress Notes (Signed)
Patient information reviewed and entered into eRehab system by Rilla Buckman, RN, CRRN, PPS Coordinator.  Information including medical coding and functional independence measure will be reviewed and updated through discharge.     Per nursing patient was given "Data Collection Information Summary for Patients in Inpatient Rehabilitation Facilities with attached "Privacy Act Statement-Health Care Records" upon admission.  

## 2013-09-13 NOTE — Evaluation (Signed)
Occupational Therapy Assessment and Plan and Skilled Therapy Intervention  Patient Details  Name: Willie [name] MRN: [id number] Date of Birth: 1940/12/02  OT Diagnosis: muscle weakness (generalized) Rehab Potential: Rehab Potential: Good ELOS: 7-10 days   Today's Date: 09/13/2013 Time: 1100-1155 and 1330-1400 Time Calculation (min): 55 min and 30 min  Problem List:  Patient Active Problem List   Diagnosis Date Noted  . Physical deconditioning 09/12/2013  . Hyponatremia 09/04/2013  . Pneumomediastinum 09/04/2013  . Pneumothorax on right 09/04/2013  . Pneumonia 09/04/2013  . Lymphoma 09/04/2013  . Conjunctivitis, acute, right eye 06/28/2013  . Bronchitis, chronic 06/28/2013  . Deep inguinal pain, right 05/26/2013  . CAD (coronary artery disease) 10/10/2012  . Abnormal nuclear cardiac imaging test 10/02/2012  . RBBB   . Hypertension   . Severe mitral regurgitation 09/08/2012  . Unexplained weight loss 05/03/2012  . Chronic renal insufficiency, stage III (moderate) 11/10/2011  . 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 History of DVT of lower extremity 11/10/2011  . Low serum testosterone level 11/10/2011  . 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 Nocturia 11/10/2011  . Normochromic Anemia 11/10/2011  . 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 Hypertension   . History of DVT of lower extremity     06/2001 as first sign of lymphoma: enlarged left inguinal nodes  . Low serum testosterone level 11/10/2011  . 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 Nocturia 11/10/2011  . Normochromic anemia 11/10/2011    Likely result of prior high dose chemotherapy  . Unexplained weight loss 05/03/2012  . Mitral valvular regurgitation 06/01/12    moderate to severe by echo- asymptomatic  . Dyslipidemia   . RBBB   . Coronary artery disease   . Family history of anesthesia complication      "son, Margreta Journey, larynx collapses" (10/11/2012)  . Heart murmur   . History of blood transfusion 2009-2010    "both related to his chemo" (10/11/2012)  . Chronic renal insufficiency, stage III (moderate)   . Non Hodgkin's lymphoma 2003; 2009    "chemo; stem cell transplant & high powered chemo" (10/10/2012)  . Renal insufficiency   . Deep inguinal pain, right 05/26/2013  . Conjunctivitis, acute, right eye 06/28/2013   Past Surgical History:  Past Surgical History  Procedure Laterality Date  . Bone marrow transplant  2010  . Cardiac catheterization  10/03/2012; 2007  . Coronary angioplasty with stent placement  10/11/2012    "2" (10/11/2012)  . Tonsillectomy  1950's  . Cataract extraction w/ intraocular lens implant Left 1990's  . Coronary artery bypass graft  03/29/2006    "CABG X5" (10/11/2012)    Assessment & Plan Clinical Impression:  Willie [name] is a 73 y.o. right-handed male with history of CAD with CABG, non-Hodgkin's lymphoma on chemotherapy who presents 09/04/2013 with shortness of breath. No chest pain. Recent PET scan showed findings consistent with pneumonia. Patient developed low-grade fever and cough. Chest x-ray showed interval development of bilateral perihilar and basilar interstitial densities concerning for pulmonary edema or less likely pneumonia. CT angio of the chest showed no pulmonary emboli. Maintained on broad-spectrum antibiotics and since been completed. Follow up hematology services in reference to non-Hodgkin's lymphoma. Findings of small right apical PTX and monitored as well as pneumomediastinum versus chemotherapy related chemical pneumonitis and maintained on steroids. Follow cardiothoracic surgery in reference to pneumomediastinum with Gastrografin  swallow completed showing no evidence of leak. No surgical therapy needed. Echocardiogram with ejection fraction of 60% no wall motion abnormalities. Patient placed on Lovenox for DVT prophylaxis. Hyponatremia 124- 126  and monitored with urine osmolality 409. Physical and occupational therapy evaluations 09/07/2013 for deconditioning noted patient tends to desaturate with activity. Recommendations made for physical medicine rehabilitation consult. Patient was admitted for comprehensive rehabilitation program  Patient transferred to CIR on 09/12/2013 .    Patient currently requires max with basic self-care skills secondary to muscle weakness, decreased oxygen support and decreased standing balance.  Prior to hospitalization, patient could complete basic ADLs independently.  Patient will benefit from skilled intervention to increase independence with basic self-care skills prior to discharge home with care partner.  Anticipate patient will require intermittent supervision and follow up home health.  OT - End of Session Activity Tolerance: Tolerates < 10 min activity with changes in vital signs Endurance Deficit: Yes Endurance Deficit Description: fatigues quickly, requires frequent rest breaks due to deconditioning and respiratory status. OT Assessment Rehab Potential: Good OT Patient demonstrates impairments in the following area(s): Balance;Edema;Endurance;Motor OT Basic ADL's Functional Problem(s): Grooming;Bathing;Dressing;Toileting OT Transfers Functional Problem(s): Toilet;Tub/Shower OT Additional Impairment(s): None OT Plan OT Intensity: Minimum of 1-2 x/day, 45 to 90 minutes OT Frequency: 5 out of 7 days OT Duration/Estimated Length of Stay: 7-10 days OT Treatment/Interventions: Balance/vestibular training;Discharge planning;DME/adaptive equipment instruction;Functional mobility training;Therapeutic Activities;Therapeutic Exercise;UE/LE Strength taining/ROM;Patient/family education;Self Care/advanced ADL retraining OT Self Feeding Anticipated Outcome(s): I OT Basic Self-Care Anticipated Outcome(s): mod I with dressing, supervision bathing OT Toileting Anticipated Outcome(s): mod I OT Bathroom  Transfers Anticipated Outcome(s): mod I to toilet, Supervision to tub bench OT Recommendation Patient destination: Home Follow Up Recommendations: Home health OT Equipment Recommended: Tub/shower bench   Skilled Therapeutic Intervention Visit 1: No c/o pain.  Pt seen for initial evaluation and ADL retraining of bathing and dressing with a focus on activity tolerance. The purpose and goals of OT explained to pt and his spouse. Pt's goals are to be as independent as possible.  Pt required 3L of O2 during session as his O2 levels decreased during activity on only 2L to 87%.  Pt fatigued easily and needed max A by the end of the session to don his LB clothing.  Pt continually asking to lay down.  Pt transferred back to recliner to rest before lunch. O2 was reduced to 2L as pt is 98% at rest.  Pt resting in recliner with wife in the room and call light in reach.  Visit 2: No c/o pain. Pt received sitting in recliner with his wife in the room. Pt on 2L of O2. The objective this session was to increase pt's activity tolerance on 2L of O2 maintaining at least 92%. Pt completed several exercises for 10- 15 reps at a time with a long rest in between to allow his O2 sats to increase after they dropped to 88% with exercise. Pt worked on shoulder extension/ scapular retraction with AROM and with theraband to increase sitting posture. Discussed importance of posture for improved breathing. Pt completed 5 reps 2x of chair push ups. Pt needs mod -max cues for proper breathing techniques.  Discussed with pt and his wife simple exercises that can be done in the room.  Pt's SLP therapist had arrived for his next session.   OT Evaluation Precautions/Restrictions  Precautions Precautions: Fall Precaution Comments: Respiratory Sats Restrictions Weight Bearing Restrictions: No   Pain Pain Assessment Pain Assessment: No/denies pain Pain Score: 0-No  pain Home Living/Prior Functioning Home Living Available Help at  Discharge: Family;Available 24 hours/day Type of Home: House Home Access: Stairs to enter [employer] of Steps: 1 step Entrance Stairs-Rails: None Home Layout: One level  Lives With: Spouse Prior Function Level of Independence: Independent with gait;Independent with transfers;Independent with basic ADLs  Able to Take Stairs?: Yes Driving: Yes Vocation: Full time employment Vocation Requirements: Was working full time until February when chemo treatments started ADL ADL ADL Comments: Refer to FIM Vision/Perception  Vision- History Baseline Vision/History: Wears glasses Wears Glasses: At all times Patient Visual Report: No change from baseline Vision- Assessment Vision Assessment?: No apparent visual deficits  Cognition Overall Cognitive Status: Within Functional Limits for tasks assessed Orientation Level: Oriented X4 Sensation Sensation Light Touch: Appears Intact Stereognosis: Appears Intact Hot/Cold: Appears Intact Proprioception: Appears Intact Coordination Gross Motor Movements are Fluid and Coordinated: Yes Fine Motor Movements are Fluid and Coordinated: Yes Motor  Motor Motor: Within Functional Limits Motor - Skilled Clinical Observations: Generalized muscle weakness Mobility  Bed Mobility Bed Mobility: Supine to Sit Supine to Sit: 5: Supervision;HOB flat;With rails Supine to Sit Details: Verbal cues for sequencing Transfers Sit to Stand: 4: Min assist;From bed;From chair/3-in-1;With armrests;With upper extremity assist Sit to Stand Details: Verbal cues for sequencing;Verbal cues for technique;Verbal cues for precautions/safety;Manual facilitation for weight shifting Stand to Sit: 4: Min assist;With armrests;With upper extremity assist;To chair/3-in-1 Stand to Sit Details (indicate cue type and reason): Verbal cues for sequencing;Manual facilitation for weight shifting;Verbal cues for precautions/safety;Verbal cues for technique  Trunk/Postural  Assessment  Cervical Assessment Cervical Assessment: Within Functional Limits Thoracic Assessment Thoracic Assessment: Within Functional Limits (kyphotic posture at rest) Lumbar Assessment Lumbar Assessment: Within Functional Limits Postural Control Postural Control: Within Functional Limits  Balance Balance Balance Assessed: Yes Static Sitting Balance Static Sitting - Balance Support: No upper extremity supported;Feet supported;Bilateral upper extremity supported Static Sitting - Level of Assistance: 5: Stand by assistance Dynamic Sitting Balance Dynamic Sitting - Balance Support: No upper extremity supported;Feet supported;During functional activity Dynamic Sitting - Level of Assistance: 5: Stand by assistance Dynamic Sitting - Balance Activities: Forward lean/weight shifting Static Standing Balance Static Standing - Balance Support: Bilateral upper extremity supported;During functional activity Static Standing - Level of Assistance: 4: Min assist Dynamic Standing Balance Dynamic Standing - Level of Assistance: 3: Mod assist Extremity/Trunk Assessment RUE Assessment RUE Assessment: Exceptions to [facility] RUE Strength RUE Overall Strength Comments: 4-/5 LUE Assessment LUE Assessment: Exceptions to [facility] LUE Strength LUE Overall Strength Comments: 4-/5  FIM:  FIM - Grooming Grooming Steps: Wash, rinse, dry face;Oral care, brush teeth, clean dentures;Wash, rinse, dry hands Grooming: 5: Set-up assist to obtain items FIM - Bathing Bathing Steps Patient Completed: Chest;Right Arm;Left Arm;Abdomen;Front perineal area;Buttocks;Right upper leg;Left upper leg;Left lower leg (including foot) Bathing: 4: Min-Patient completes 8-9 3f10 parts or 75+ percent FIM - Upper Body Dressing/Undressing Upper body dressing/undressing steps patient completed: Thread/unthread right sleeve of pullover shirt/dresss;Thread/unthread left sleeve of pullover shirt/dress;Put head through opening of pull over  shirt/dress;Pull shirt over trunk Upper body dressing/undressing: 5: Set-up assist to: Obtain clothing/put away FIM - Lower Body Dressing/Undressing Lower body dressing/undressing: 1: Total-Patient completed less than 25% of tasks FIM - Toileting Toileting: 0: Activity did not occur FIM - BControl and instrumentation engineerDevices: Bed rails;Arm rests Bed/Chair Transfer: 5: Supine > Sit: Supervision (verbal cues/safety issues);4: Bed > Chair or W/C: Min A (steadying Pt. > 75%);4: Chair or W/C > Bed: Min A (steadying Pt. > 75%) FIM -  Air cabin crew Transfers: 0-Activity did not occur FIM - Camera operator Transfers: 0-Activity did not occur or was simulated   Refer to Care Plan for Long Term Goals  Recommendations for other services: None  Discharge Criteria: Patient will be discharged from OT if patient refuses treatment 3 consecutive times without medical reason, if treatment goals not met, if there is a change in medical status, if patient makes no progress towards goals or if patient is discharged from hospital.  The above assessment, treatment plan, treatment alternatives and goals were discussed and mutually agreed upon: by patient and by family  Harlene Ramus 09/13/2013, 1:17 PM

## 2013-09-13 NOTE — Evaluation (Signed)
Speech Language Pathology Assessment and Plan  Patient Details  Name: Willie [name] MRN: [id number] Date of Birth: 1940/11/13  SLP Diagnosis: Cognitive Impairments  Rehab Potential: Excellent ELOS: 7-10 days   Today's Date: 09/13/2013 Time: 5366-4403 Time Calculation (min): 53 min  Problem List:  Patient Active Problem List   Diagnosis Date Noted  . Physical deconditioning 09/12/2013  . Hyponatremia 09/04/2013  . Pneumomediastinum 09/04/2013  . Pneumothorax on right 09/04/2013  . Pneumonia 09/04/2013  . Lymphoma 09/04/2013  . Conjunctivitis, acute, right eye 06/28/2013  . Bronchitis, chronic 06/28/2013  . Deep inguinal pain, right 05/26/2013  . CAD (coronary artery disease) 10/10/2012  . Abnormal nuclear cardiac imaging test 10/02/2012  . RBBB   . Hypertension   . Severe mitral regurgitation 09/08/2012  . Unexplained weight loss 05/03/2012  . Chronic renal insufficiency, stage III (moderate) 11/10/2011  . 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 History of DVT of lower extremity 11/10/2011  . Low serum testosterone level 11/10/2011  . 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 Nocturia 11/10/2011  . Normochromic Anemia 11/10/2011  . 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 Hypertension   . History of DVT of lower extremity     06/2001 as first sign of lymphoma: enlarged left inguinal nodes  . Low serum testosterone level 11/10/2011  . 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 Nocturia 11/10/2011  . Normochromic anemia 11/10/2011    Likely result of prior high dose chemotherapy  . Unexplained weight loss 05/03/2012  . Mitral valvular regurgitation 06/01/12    moderate to severe by echo- asymptomatic  . Dyslipidemia   . RBBB   . Coronary artery disease   . Family history of anesthesia complication     "son, Margreta Journey, larynx collapses" (10/11/2012)  . Heart murmur   . History  of blood transfusion 2009-2010    "both related to his chemo" (10/11/2012)  . Chronic renal insufficiency, stage III (moderate)   . Non Hodgkin's lymphoma 2003; 2009    "chemo; stem cell transplant & high powered chemo" (10/10/2012)  . Renal insufficiency   . Deep inguinal pain, right 05/26/2013  . Conjunctivitis, acute, right eye 06/28/2013   Past Surgical History:  Past Surgical History  Procedure Laterality Date  . Bone marrow transplant  2010  . Cardiac catheterization  10/03/2012; 2007  . Coronary angioplasty with stent placement  10/11/2012    "2" (10/11/2012)  . Tonsillectomy  1950's  . Cataract extraction w/ intraocular lens implant Left 1990's  . Coronary artery bypass graft  03/29/2006    "CABG X5" (10/11/2012)    Assessment / Plan / Recommendation Clinical Impression Pt is a 73 y.o. right-handed male with history of CAD with CABG, non-Hodgkin's lymphoma on chemotherapy who presents 09/04/2013 with shortness of breath. No chest pain. Recent PET scan showed findings consistent with pneumonia. Patient developed low-grade fever and cough. Chest x-ray showed interval development of bilateral perihilar and basilar interstitial densities concerning for pulmonary edema or less likely pneumonia. CT angio of the chest showed no pulmonary emboli. Maintained on broad-spectrum antibiotics and since been completed. Follow up hematology services in reference to non-Hodgkin's lymphoma. Findings of small right apical PTX and monitored as well as pneumomediastinum versus chemotherapy related chemical pneumonitis and maintained on steroids. Follow cardiothoracic surgery in reference to pneumomediastinum with Gastrografin swallow completed showing no evidence of leak. No surgical therapy needed. Echocardiogram  with ejection fraction of 60% no wall motion abnormalities. Patient placed on Lovenox for DVT prophylaxis. Hyponatremia 124- 126 and monitored with urine osmolality 409. Physical and occupational therapy  evaluations 09/07/2013 for deconditioning noted patient tends to desaturate with activity. Recommendations made for physical medicine rehabilitation consult and patient was admitted for comprehensive rehabilitation program on 09/12/13 and presents with mild-moderate cognitive impairments characterized by decreased working memory, awareness and orientation with delayed processing. Pt would benefit from skilled SLP intervention to maximize cognitive function and overall functional independence prior to discharge.   Skilled Therapeutic Interventions          Administered cognitive-linguistic evaluation. Please see above for details. Also educated the pt and his wife in regards to pt's current cognitive impairments and goals of skilled SLP intervention. Please see above for details.   SLP Assessment  Patient will need skilled Scraper Pathology Services during CIR admission    Recommendations  Oral Care Recommendations: Oral care BID Patient destination: Home Follow up Recommendations: Home Health SLP;24 hour supervision/assistance (TBD) Equipment Recommended: None recommended by SLP    SLP Frequency 5 out of 7 days   SLP Treatment/Interventions Cognitive remediation/compensation;Cueing hierarchy;Therapeutic Activities;Patient/family education;Functional tasks;Environmental controls;Internal/external aids    Pain No/Denies Pain Prior Functioning Type of Home: House  Lives With: Spouse Available Help at Discharge: Family;Available 24 hours/day Vocation: Full time employment  Short Term Goals: Week 1: SLP Short Term Goal 1 (Week 1): Pt will utilize external memory aids to recall new, daily information with Min A multimodal cues.  SLP Short Term Goal 2 (Week 1): Pt will demonstrate functioanl problem solving for basic and familiar tasks with supervision multimodal cues.   See FIM for current functional status Refer to Care Plan for Long Term Goals  Recommendations for other services:  Neuropsych  Discharge Criteria: Patient will be discharged from SLP if patient refuses treatment 3 consecutive times without medical reason, if treatment goals not met, if there is a change in medical status, if patient makes no progress towards goals or if patient is discharged from hospital.  The above assessment, treatment plan, treatment alternatives and goals were discussed and mutually agreed upon: by patient and by family  [name] 09/13/2013, 3:29 PM

## 2013-09-14 ENCOUNTER — Ambulatory Visit: Payer: Medicare Other | Admitting: Hematology and Oncology

## 2013-09-14 ENCOUNTER — Inpatient Hospital Stay (HOSPITAL_COMMUNITY): Payer: Medicare Other | Admitting: Physical Therapy

## 2013-09-14 ENCOUNTER — Inpatient Hospital Stay (HOSPITAL_COMMUNITY): Payer: Medicare Other

## 2013-09-14 ENCOUNTER — Other Ambulatory Visit: Payer: Medicare Other

## 2013-09-14 ENCOUNTER — Inpatient Hospital Stay (HOSPITAL_COMMUNITY): Payer: Medicare Other | Admitting: Speech Pathology

## 2013-09-14 ENCOUNTER — Encounter (HOSPITAL_COMMUNITY): Payer: Medicare Other | Admitting: Occupational Therapy

## 2013-09-14 LAB — BASIC METABOLIC PANEL
BUN: 57 mg/dL — ABNORMAL HIGH (ref 6–23)
CO2: 25 mEq/L (ref 19–32)
Calcium: 10 mg/dL (ref 8.4–10.5)
Chloride: 85 mEq/L — ABNORMAL LOW (ref 96–112)
Creatinine, Ser: 2.51 mg/dL — ABNORMAL HIGH (ref 0.50–1.35)
GFR calc Af Amer: 28 mL/min — ABNORMAL LOW (ref >90)
GFR calc non Af Amer: 24 mL/min — ABNORMAL LOW (ref >90)
GLUCOSE: 101 mg/dL — AB (ref 70–99)
POTASSIUM: 5.4 meq/L — AB (ref 3.7–5.3)
Sodium: 121 mEq/L — CL (ref 137–147)

## 2013-09-14 NOTE — Progress Notes (Signed)
[facility] PHYSICAL MEDICINE & REHABILITATION     PROGRESS NOTE    Subjective/Complaints: Had a pretty good day yesterday. No problems over night.  A 12 point review of systems has been performed and if not noted above is otherwise negative.   Objective: Vital Signs: Blood pressure 121/70, pulse 91, temperature 98.1 F (36.7 C), temperature source Axillary, resp. rate 21, weight 64.638 kg (142 lb 8 oz), SpO2 99.00%. No results found.  Recent Labs  09/12/13 1930  WBC 16.6*  HGB 9.0*  HCT 25.3*  PLT 220    Recent Labs  09/12/13 0856 09/12/13 1930  NA 124*  --   K 4.8  --   CL 87*  --   GLUCOSE 109*  --   BUN 44*  --   CREATININE 1.70* 2.05*  CALCIUM 10.3  --    CBG (last 3)  No results found for this basename: GLUCAP,  in the last 72 hours  Wt Readings from Last 3 Encounters:  09/14/13 64.638 kg (142 lb 8 oz)  09/10/13 60.782 kg (134 lb)  08/30/13 60.51 kg (133 lb 6.4 oz)    Physical Exam:   Constitutional: He is oriented to person, place, and time.  73 year old frail Caucasian male  HENT: mucosa pink/moist, tongue a little dry  Head: Normocephalic.  Eyes: EOM are normal.  Neck: Normal range of motion. Neck supple. No thyromegaly present.  Cardiovascular: Normal rate and regular rhythm. Systolic murmur  Respiratory:  Breathing appears less labored today. GI: Soft. Bowel sounds are normal. He exhibits no distension.  Neurological: He is alert and oriented to person, place, and time.  motor strength is 4+/5 bilateral deltoid, bicep, tricep, grip, hip flexors, knee extensors, ankle dorsiflexion plantar flexion  Sensation is intact to light touch and proprioception bilateral upper and lower limbs  Patient moves with delay, needs repeated cueing at times.  Psych: affect flat, cooperative.  y   Assessment/Plan: 1. Functional deficits secondary to deconditioning after pneumonia/respiratory failure which require 3+ hours per day of interdisciplinary therapy in  a comprehensive inpatient rehab setting. Physiatrist is providing close team supervision and 24 hour management of active medical problems listed below. Physiatrist and rehab team continue to assess barriers to discharge/monitor patient progress toward functional and medical goals. FIM: FIM - Bathing Bathing Steps Patient Completed: Chest;Right Arm;Left Arm;Abdomen;Front perineal area;Buttocks;Right upper leg;Left upper leg;Left lower leg (including foot) Bathing: 4: Min-Patient completes 8-9 66f 10 parts or 75+ percent  FIM - Upper Body Dressing/Undressing Upper body dressing/undressing steps patient completed: Thread/unthread right sleeve of pullover shirt/dresss;Thread/unthread left sleeve of pullover shirt/dress;Put head through opening of pull over shirt/dress;Pull shirt over trunk Upper body dressing/undressing: 5: Set-up assist to: Obtain clothing/put away FIM - Lower Body Dressing/Undressing Lower body dressing/undressing: 1: Total-Patient completed less than 25% of tasks  FIM - Musician Devices: Grab bar or rail for support Toileting: 1: Two helpers  FIM - Air cabin crew Transfers: 0-Activity did not occur  FIM - Control and instrumentation engineer Devices: Bed rails;Arm rests Bed/Chair Transfer: 5: Supine > Sit: Supervision (verbal cues/safety issues);4: Bed > Chair or W/C: Min A (steadying Pt. > 75%);4: Chair or W/C > Bed: Min A (steadying Pt. > 75%)  FIM - Locomotion: Wheelchair Distance: 60 Locomotion: Wheelchair: 2: Travels 50 - 149 ft with minimal assistance (Pt.>75%) FIM - Locomotion: Ambulation Locomotion: Ambulation Assistive Devices: Administrator Ambulation/Gait Assistance: 4: Min guard Locomotion: Ambulation: 1: Travels less than 50 ft with minimal  assistance (Pt.>75%)  Comprehension Comprehension Mode: Auditory Comprehension: 5-Understands complex 90% of the time/Cues < 10% of the time  Expression Expression Mode:  Verbal Expression: 4-Expresses basic 75 - 89% of the time/requires cueing 10 - 24% of the time. Needs helper to occlude trach/needs to repeat words.  Social Interaction Social Interaction: 5-Interacts appropriately 90% of the time - Needs monitoring or encouragement for participation or interaction.  Problem Solving Problem Solving: 4-Solves basic 75 - 89% of the time/requires cueing 10 - 24% of the time  Memory Memory: 2-Recognizes or recalls 25 - 49% of the time/requires cueing 51 - 75% of the time Medical Problem List and Plan:  1. Functional deficits secondary to deconditioning after pneumonia, respiratory failure with history of non-Hodgkin's lymphoma  2. DVT Prophylaxis/Anticoagulation: Subcutaneous Lovenox. Monitor platelet counts any signs of bleeding  3. Pain Management: Hydrocodone as needed. Monitor with increased mobility  4. Mood: Bouts of confusion discussed baseline with family  5. Neuropsych: This patient is not capable of making decisions on his own behalf.  6. Non-Hodgkin's lymphoma. Followup per Dr.Gorsuch. 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 Chemotherapy program to be reevaluated as outpatient   -labs stable---recheck in am 7. Hypertension. Lopressor 25 mg daily. Monitor with increased mobility  8. CAD with CABG. No chest pain or shortness of breath. Continue Effient 10 mg daily as well as aspirin  9. Hyperlipidemia. Lipitor  10. Chronic renal insufficiency. Baseline creatinine 1.51.  Recheck tomorrow 11. 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 Sodium 124-126. Continue sodium chloride tablets. Followup labs.  -Fluid restriction      LOS (Days) 2 A FACE TO FACE EVALUATION WAS PERFORMED  Meredith Staggers 09/14/2013 8:02 AM

## 2013-09-14 NOTE — Progress Notes (Signed)
Occupational Therapy Session Note  Patient Details  Name: Willie [name] MRN: [id number] Date of Birth: 17-May-1940  Today's Date: 09/14/2013 Time: 0815-0900 Time Calculation (min): 45 min  Short Term Goals: Week 1:  OT Short Term Goal 1 (Week 1): STGs = LTGs due to short LOS  Skilled Therapeutic Interventions/Progress Updates:    Pt seen for individual OT treatment session today w/ focus on activity tolerance and endurance during ADL retraining session. Pt was overall Min A for transfers (bed to w/c, sit to stand at sink for bathing and dressing). O2 was 92% on 2L at start of therapy session, pt was noted to fatigue easily w/ little activity noted & repeatedly asked to go back to bed throughout session. O2 decreased to 86% in standing at sink to don pull up, underwear and pants & was therefore increased to 3L (where it returned to 92-93% after pursed lip breathing techniques & moderate vc's). Pt initially denied need for toileting despite encouragement & was noted to be incontinent of bladder in standing to don clean clothing & was initially unaware of this as well. Pt was cleaned up and re-dressed in clean clothing requiring max A by end of session. Pt was then assisted to recliner chair and was resting in supine w/ LE elevated at end of session on 2L O2., his spouse was present throughout. Call bell and phone were in reach.  Therapy Documentation Precautions:  Precautions Precautions: Fall Precaution Comments: Respiratory Sats Restrictions Weight Bearing Restrictions: No General:   Vital Signs:   Pain: Pain Assessment Pain Assessment: No/denies pain Pain Score: 0-No pain ADL: ADL ADL Comments: Refer to FIM     See FIM for current functional status  Therapy/Group: Individual Therapy  Itzell Bendavid B Layton Naves 09/14/2013, 12:24 PM

## 2013-09-14 NOTE — Progress Notes (Signed)
CRITICAL VALUE ALERT  Critical value received:  Na: 121  Date of notification:  09/14/13  Time of notification:  0820  Critical value read back:yes  Nurse who received alert:  Rayetta Pigg, RN  MD notified (1st page):  Marlowe Shores, PA  Time of first page:  0822  MD notified (2nd page):  Time of second page:  Responding MD:  Marlowe Shores, [place]  Time MD responded:  [phone]

## 2013-09-14 NOTE — Progress Notes (Signed)
Occupational Therapy Note  Patient Details  Name: Willie [name] MRN: [id number] Date of Birth: [birth year] Today's Date: 09/14/2013  Time: 1330-1400 Pt denied pain Individual Therapy  RN assisting patient to recliner upon arrival with wife present.  O2 sats at 87% on 2L O2 following transfer.  Pt encourage to use pursed lip breathing.  Pt exhibited difficulty with this technique and had tendency to hold breath after inhaling.  O2 sats >90% after approx 90 seconds.  Pt engaged in BUE with 1# weight bar and theraband.  Pts O2 sats >94% on 2L O2 after each set of 8 reps of exercise. Weight bar and theraband left in room with instructions to continue with exercises throughout day to tolerance. Focus on activity tolerance to increase independence with BADLs.   Leroy Libman 09/14/2013, 2:39 PM

## 2013-09-14 NOTE — Progress Notes (Signed)
Physical Therapy Note  Patient Details  Name: Willie [name] MRN: [id number] Date of Birth: [birth year] Today's Date: 09/14/2013  Time: 1130-1200 30 minutes  1:1 No c/o pain.  Pt seated in recliner with spO2 98% on 2L O2 at rest.  Pt min A for sit to stand with RW, cues for UE placement, gait 25' with RW with min A, cues for deep breathing.  Pt then with spO2 83% requiring 5 minutes and cues for pursed lip breathing to return to 90% on 2L O2.  Pt noted to have shallow breathing, using accessory mm, continued education on deep breathing techniques.  Pt then able to gait 30' with RW with min A.  Squat pivot to chair with min A, cues for safe hand placement.   Danae Orleans Afiya Ferrebee 09/14/2013, 1:06 PM

## 2013-09-14 NOTE — IPOC Note (Signed)
Overall Plan of Care [facility]) Patient Details Name: Willie [name] MRN: [id number] DOB: Jul 30, 1940  Admitting Diagnosis: DECONDITIONED PNA  Hospital Problems: Active Problems:   Physical deconditioning     Functional Problem List: Nursing Bladder;Bowel;Safety  PT Balance;Endurance;Motor;Safety  OT Balance;Edema;Endurance;Motor  SLP Cognition  TR         Basic ADL's: OT Grooming;Bathing;Dressing;Toileting     Advanced  ADL's: OT       Transfers: PT Bed Mobility;Bed to Chair;Car;Furniture  OT Toilet;Tub/Shower     Locomotion: PT Ambulation;Wheelchair Mobility;Stairs     Additional Impairments: OT None  SLP Social Cognition   Problem Solving;Memory  TR      Anticipated Outcomes Item Anticipated Outcome  Self Feeding I  Swallowing      Basic self-care  mod I with dressing, supervision bathing  Toileting  mod I   Bathroom Transfers mod I to toilet, Supervision to tub bench  Bowel/Bladder  Continent of bowel and bladder  Transfers  mod I  Locomotion  mod I household, supervision stairs, supervision community ambulation  Communication     Cognition  Min A  Pain  <4  Safety/Judgment  Supervision   Therapy Plan: PT Intensity: Minimum of 1-2 x/day ,45 to 90 minutes PT Frequency: 5 out of 7 days PT Duration Estimated Length of Stay: 7-10 days OT Intensity: Minimum of 1-2 x/day, 45 to 90 minutes OT Frequency: 5 out of 7 days OT Duration/Estimated Length of Stay: 7-10 days SLP Intensity: Minumum of 1-2 x/day, 30 to 90 minutes SLP Frequency: 5 out of 7 days SLP Duration/Estimated Length of Stay: 7-10 days       Team Interventions: Nursing Interventions Bladder Management;Pain Management;Disease Management/Prevention;Patient/Family Education  PT interventions Ambulation/gait training;Balance/vestibular training;Community reintegration;Discharge planning;Neuromuscular re-education;Functional mobility training;DME/adaptive equipment instruction;Disease  management/prevention;Pain Landscape architect;Wheelchair propulsion/positioning;Therapeutic Activities;Patient/family education;Psychosocial support;Therapeutic Exercise;UE/LE Strength taining/ROM;UE/LE Coordination activities  OT Interventions Balance/vestibular training;Discharge planning;DME/adaptive equipment instruction;Functional mobility training;Therapeutic Activities;Therapeutic Exercise;UE/LE Strength taining/ROM;Patient/family education;Self Care/advanced ADL retraining  SLP Interventions Cognitive remediation/compensation;Cueing hierarchy;Therapeutic Activities;Patient/family education;Functional tasks;Environmental controls;Internal/external aids  TR Interventions    SW/CM Interventions Discharge Planning;Psychosocial Support;Patient/Family Education    Team Discharge Planning: Destination: PT-Home ,OT- Home , SLP-Home Projected Follow-up: PT-Home health PT, OT-  Home health OT, SLP-Home Health SLP;24 hour supervision/assistance (TBD) Projected Equipment Needs: PT-Rolling walker with 5" wheels;Wheelchair (measurements);Wheelchair cushion (measurements);To be determined, OT- Tub/shower bench, SLP-None recommended by SLP Equipment Details: PT-Patient does not own any DME; Further recommendations TBD upon discharge, OT-  Patient/family involved in discharge planning: PT- Patient;Family member/caregiver,  OT-Patient;Family member/caregiver, SLP-Patient;Family member/caregiver  MD ELOS: 7-10 days Medical Rehab Prognosis:  Excellent Assessment: The patient has been admitted for CIR therapies with the diagnosis of deconditioning. The team will be addressing functional mobility, strength, stamina, balance, safety, adaptive techniques and equipment, self-care, bowel and bladder mgt, patient and caregiver education, cognition, awareness, nutrition, fall awareness. Goals have been set at mod I for basic mobility and self-care and min assist for cognition.    Meredith Staggers, MD,  FAAPMR      See Team Conference Notes for weekly updates to the plan of care

## 2013-09-14 NOTE — Progress Notes (Signed)
Physical Therapy Session Note  Patient Details  Name: Willie [name] MRN: [id number] Date of Birth: 05-22-1940  Today's Date: 09/14/2013 Time: 1941-7408 Time Calculation (min): 38 min  Short Term Goals: Week 1:  PT Short Term Goal 1 (Week 1): STGs=LTGs secondary to ELOS  Skilled Therapeutic Interventions/Progress Updates:   Pt received seated in bedside chair with wife present. Pt on 2 L/min supplemental O2 via [place]. Pt required min coaxing to agree to participate in PT secondary to significant fatigue. Session focused on maintaining SpO2 levels >89% during functional mobility. Pt performed sit<>stand transfer from bedside chair with rolling walker requiring min A. After static standing x15 seconds, SpO2 73%, HR 123. Performed stand>sit with min guard, no cueing required for hand placement. Max verbal cueing required for nasal inhalation, pursed lip breathing. Increased supplemental O2 to 4 L/min; SpO2 levels increased to 95% within 1 minute with continued cueing.   After vitals stabilized supplemental O2 decreased to 2 L/min with SpO2 stable at >93%. Pt requesting to end session at this time secondary to fatigue. With coaxing, pt agreeable to attempting diaphragmatic breathing at rest. Educated pt/wife on purpose, technique of diaphragmatic breathing. During breathing, provided tactile cueing (placed one of pt's hands on sternum, other hand on abdomen) and verbal cueing to increase abdominal excursion during inhalation. Will need reinforcement. Session ended secondary to decreased pt arousal. Therapist departed with pt seated in bedside chair with LE's elevated, wife present, and all needs within reach.  Therapy Documentation Precautions:  Precautions Precautions: Fall Precaution Comments: Respiratory Sats Restrictions Weight Bearing Restrictions: No Vital Signs: Therapy Vitals Temp: 97.3 F (36.3 C) Temp src: Oral Pulse Rate: 91 Resp: 20 BP: 101/66 mmHg Patient Position (if  appropriate): Sitting Oxygen Therapy SpO2: 95 % (After seated rest break, O2 increased, cues for pursed-lip breathing) O2 Device: Nasal cannula O2 Flow Rate (L/min): 4 L/min Pain: Pain Assessment Pain Assessment: No/denies pain  See FIM for current functional status  Therapy/Group: Individual Therapy  Malva Cogan [place] 09/14/2013, 3:03 PM

## 2013-09-14 NOTE — Progress Notes (Signed)
Speech Language Pathology Daily Session Note  Patient Details  Name: Willie [name] MRN: [id number] Date of Birth: 02-Nov-1940  Today's Date: 09/14/2013 Time: 1100-1130 Time Calculation (min): 30 min  Short Term Goals: Week 1: SLP Short Term Goal 1 (Week 1): Pt will utilize external memory aids to recall new, daily information with Min A multimodal cues.  SLP Short Term Goal 2 (Week 1): Pt will demonstrate functioanl problem solving for basic and familiar tasks with supervision multimodal cues.   Skilled Therapeutic Interventions: Skilled treatment session focused on cognitive goals. SLP facilitated session by providing Max A multimodal cues for utilization of external memory aids such as his schedule to increase recall previous therapy sessions.  Pt also independently requested a notebook to record information in from therapy sessions, however, pt required Max multimodal cues for organization of information. [name] present and educated on current memory strategies and verbalized understanding and reported she will utilize the current strategies with the patient. Continue with current plan of care.    FIM:  Comprehension Comprehension Mode: Auditory Comprehension: 5-Understands complex 90% of the time/Cues < 10% of the time Expression Expression Mode: Verbal Expression: 4-Expresses basic 75 - 89% of the time/requires cueing 10 - 24% of the time. Needs helper to occlude trach/needs to repeat words. Social Interaction Social Interaction: 5-Interacts appropriately 90% of the time - Needs monitoring or encouragement for participation or interaction. Problem Solving Problem Solving: 4-Solves basic 75 - 89% of the time/requires cueing 10 - 24% of the time Memory Memory: 2-Recognizes or recalls 25 - 49% of the time/requires cueing 51 - 75% of the time  Pain Pain Assessment Pain Assessment: No/denies pain  Therapy/Group: Individual Therapy  [name] 09/14/2013, 2:26 PM

## 2013-09-15 ENCOUNTER — Encounter (HOSPITAL_COMMUNITY): Payer: Medicare Other | Admitting: Occupational Therapy

## 2013-09-15 ENCOUNTER — Inpatient Hospital Stay (HOSPITAL_COMMUNITY): Payer: Medicare Other | Admitting: Speech Pathology

## 2013-09-15 ENCOUNTER — Ambulatory Visit: Payer: Medicare Other | Admitting: Hematology and Oncology

## 2013-09-15 ENCOUNTER — Ambulatory Visit: Payer: Medicare Other

## 2013-09-15 ENCOUNTER — Inpatient Hospital Stay (HOSPITAL_COMMUNITY): Payer: Medicare Other

## 2013-09-15 ENCOUNTER — Inpatient Hospital Stay (HOSPITAL_COMMUNITY): Payer: Medicare Other | Admitting: Physical Therapy

## 2013-09-15 ENCOUNTER — Other Ambulatory Visit: Payer: Medicare Other

## 2013-09-15 DIAGNOSIS — I5033 Acute on chronic diastolic (congestive) heart failure: Secondary | ICD-10-CM

## 2013-09-15 DIAGNOSIS — R5381 Other malaise: Secondary | ICD-10-CM

## 2013-09-15 DIAGNOSIS — I2510 Atherosclerotic heart disease of native coronary artery without angina pectoris: Secondary | ICD-10-CM

## 2013-09-15 DIAGNOSIS — N183 Chronic kidney disease, stage 3 unspecified: Secondary | ICD-10-CM

## 2013-09-15 DIAGNOSIS — C8445 Peripheral T-cell lymphoma, not classified, lymph nodes of inguinal region and lower limb: Secondary | ICD-10-CM

## 2013-09-15 LAB — GLUCOSE, CAPILLARY: Glucose-Capillary: 186 mg/dL — ABNORMAL HIGH (ref 70–99)

## 2013-09-15 LAB — CBC
HCT: 23 % — ABNORMAL LOW (ref 39.0–52.0)
HEMOGLOBIN: 8.2 g/dL — AB (ref 13.0–17.0)
MCH: 36 pg — AB (ref 26.0–34.0)
MCHC: 35.7 g/dL (ref 30.0–36.0)
MCV: 100.9 fL — ABNORMAL HIGH (ref 78.0–100.0)
PLATELETS: 212 10*3/uL (ref 150–400)
RBC: 2.28 MIL/uL — AB (ref 4.22–5.81)
RDW: 18.3 % — ABNORMAL HIGH (ref 11.5–15.5)
WBC: 21.4 10*3/uL — ABNORMAL HIGH (ref 4.0–10.5)

## 2013-09-15 LAB — URINALYSIS, ROUTINE W REFLEX MICROSCOPIC
Bilirubin Urine: NEGATIVE
GLUCOSE, UA: NEGATIVE mg/dL
Ketones, ur: NEGATIVE mg/dL
NITRITE: NEGATIVE
PH: 6 (ref 5.0–8.0)
Protein, ur: 30 mg/dL — AB
SPECIFIC GRAVITY, URINE: 1.011 (ref 1.005–1.030)
Urobilinogen, UA: 0.2 mg/dL (ref 0.0–1.0)

## 2013-09-15 LAB — URINE MICROSCOPIC-ADD ON

## 2013-09-15 LAB — CK TOTAL AND CKMB (NOT AT ARMC)
CK TOTAL: 52 U/L (ref 7–232)
CK, MB: 4.8 ng/mL — ABNORMAL HIGH (ref 0.3–4.0)
Relative Index: INVALID (ref 0.0–2.5)

## 2013-09-15 LAB — BASIC METABOLIC PANEL
BUN: 62 mg/dL — ABNORMAL HIGH (ref 6–23)
CHLORIDE: 86 meq/L — AB (ref 96–112)
CO2: 23 meq/L (ref 19–32)
Calcium: 10.1 mg/dL (ref 8.4–10.5)
Creatinine, Ser: 2.36 mg/dL — ABNORMAL HIGH (ref 0.50–1.35)
GFR calc Af Amer: 30 mL/min — ABNORMAL LOW (ref >90)
GFR calc non Af Amer: 26 mL/min — ABNORMAL LOW (ref >90)
GLUCOSE: 146 mg/dL — AB (ref 70–99)
POTASSIUM: 5.3 meq/L (ref 3.7–5.3)
Sodium: 123 mEq/L — ABNORMAL LOW (ref 137–147)

## 2013-09-15 LAB — TROPONIN I

## 2013-09-15 MED ORDER — ENOXAPARIN SODIUM 30 MG/0.3ML ~~LOC~~ SOLN
30.0000 mg | SUBCUTANEOUS | Status: DC
  Filled 2013-09-15: qty 0.3

## 2013-09-15 MED ORDER — FUROSEMIDE 10 MG/ML IJ SOLN
20.0000 mg | Freq: Once | INTRAMUSCULAR | Status: AC
  Administered 2013-09-16: 20 mg via INTRAVENOUS
  Filled 2013-09-15: qty 2

## 2013-09-15 MED ORDER — FUROSEMIDE 10 MG/ML IJ SOLN
40.0000 mg | Freq: Once | INTRAMUSCULAR | Status: AC
  Administered 2013-09-15: 40 mg via INTRAVENOUS
  Filled 2013-09-15: qty 4

## 2013-09-15 MED ORDER — PREDNISONE 20 MG PO TABS
40.0000 mg | ORAL_TABLET | Freq: Every day | ORAL | Status: DC
  Administered 2013-09-16 – 2013-09-19 (×4): 40 mg via ORAL
  Filled 2013-09-15 (×7): qty 2

## 2013-09-15 MED ORDER — SODIUM CHLORIDE 0.9 % IV SOLN
INTRAVENOUS | Status: DC
  Administered 2013-09-15: 09:00:00 via INTRAVENOUS

## 2013-09-15 NOTE — Consult Note (Addendum)
CARDIOLOGY CONSULT NOTE   Patient ID: Willie [name] MRN: [id number], DOB/AGE: 1940-10-20   Admit date: 09/12/2013 Date of Consult: 09/15/2013   Primary Physician: Willie Pel, MD Primary Cardiologist: Dr. Gwenlyn [name]  Pt. Profile  Willie [name] is 73 yo male with PMH significant for CAD, postop PAF which was resolved, CKD class III, h/o nonhodgkin's lymphoma, HTN, mod to sev MR, diastolic dysfunction and chronic anemia. The patient had worsening shortness of breath today. He was [name] to be volume overloaded. It was noted that he had atrial flutter that is newly diagnosed since his EKG on admission  Problem List  Past Medical History  Diagnosis Date  . Hypertension   . History of DVT of lower extremity     06/2001 as first sign of lymphoma: enlarged left inguinal nodes  . Low serum testosterone level 11/10/2011  . 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 Nocturia 11/10/2011  . Normochromic anemia 11/10/2011    Likely result of prior high dose chemotherapy  . Unexplained weight loss 05/03/2012  . Mitral valvular regurgitation 06/01/12    moderate to severe by echo- asymptomatic  . Dyslipidemia   . RBBB   . Coronary artery disease   . Family history of anesthesia complication     "son, Willie [name], larynx collapses" (10/11/2012)  . Heart murmur   . History of blood transfusion 2009-2010    "both related to his chemo" (10/11/2012)  . Chronic renal insufficiency, stage III (moderate)   . Non Hodgkin's lymphoma 2003; 2009    "chemo; stem cell transplant & high powered chemo" (10/10/2012)  . Renal insufficiency   . Deep inguinal pain, right 05/26/2013  . Conjunctivitis, acute, right eye 06/28/2013    Past Surgical History  Procedure Laterality Date  . Bone marrow transplant  2010  . Cardiac catheterization  10/03/2012; 2007  . Coronary angioplasty with stent placement  10/11/2012    "2" (10/11/2012)  . Tonsillectomy  1950's  . Cataract extraction w/ intraocular lens implant  Left 1990's  . Coronary artery bypass graft  03/29/2006    "CABG X5" (10/11/2012)     Allergies  Allergies  Allergen Reactions  . Niaspan [Niacin Er] Rash    HPI   Willie [name] is 73 yo male with PMH significant for CAD, postop PAF which was resolved, CKD class III, h/o nonhodgkin's lymphoma, HTN, MR worsened by CAD, diastolic dysfunction and chronic anemia. Patient has a history of bypass grafting x5 on March 29, 2006. He had a LIMA to the LAD, SVG to the diagonal, a sequential vein graft to the first and second obtuse marginal, and a vein graft to the posterolateral artery. Family states, he did not have any CP or SOB prior to CABG. Apparently his PCP noted some changes on his EKG and referred him to cardiologist who obtained a stress test which came back abnormal. Postoperatively, he had paroxysmal atrial fibrillation which was resolved. He saw Dr. Gwenlyn [name] in 2014 because his PCP picked up a murmur. An echo obtained showed preserved EF with diastolic dysfunction and moderate to severe regurgitation directed eccentrically toward the free wall. Again, he did not have any chest pain or shortness. He under treadmill nuc which showed inferior scar with moderate peri-infarct ischemia which is a new finding compared to his prior Myoview performed 08/22/2010. He eventually underwent cath x 2 in June 2014 and had 2 DES placed in a 90% mid-RCA stenosis and 90% distal L main stenosis. [name] cath, patient was placed on ASA  and effient.   His non-hodgkin's lymphoma recurred in the R inguinal area around Feb/March 2015. It was confirmed by 2 separate biopsies. His effient was discontinued for 7 days prior to each biopsy and was resumed afterward. Patient presented to [facility] ED on 09/14/2013 with severe SOB. CXR demonstrated bilateral perihilar and basilar interstitial densities concerning for pulm edema or pnemonia. According to the family, patient had a temperature at home. Therefore, he was prophylactically treated  with abx. CT angiogram of chest showed no sign of PE however did reveal large volume pneumomediastinum which extended into the neck. Therefore, patient was transferred to [facility] for further workup. Patient was evaluated by pulmonology and R apical pneumothorax will be managed conservatively. Gastrografin test obtained on 5/20 showed no evidence of leak to explain pneumomediastinum.   For the past several few days, patient has been having more SOB, however he was still responding normally. Cr has been cardiology and trending up from 1.7 to >2. Hgb has been trending down from 11 to 8. In the morning of 09/15/2013, patient began to have increasing weakness. While doing physical therapy around noon, family noted he appeared very weak and heard him saying he had to sit down immediately. CXR was obtained which showed worsening pulm edema. He was given 40mg  IV lasix with good diuresis. EKG was obtained which showed chronic RBBB with 4:1 atrial flutter which was rate controlled. Cardiology was consulted for abnormal EKG and dyspnea.  Inpatient Medications  . aspirin EC  81 mg Oral QPM  . atorvastatin  20 mg Oral Once per day on Sun Wed  . cholecalciferol  1,000 Units Oral Daily  . vitamin B-12  500 mcg Oral Daily  . docusate sodium  100 mg Oral BID  . enoxaparin (LOVENOX) injection  30 mg Subcutaneous Q24H  . metoprolol tartrate  25 mg Oral q morning - 10a  . multivitamin with minerals  1 tablet Oral Daily  . oxymetazoline  1 spray Each Nare BID  . prasugrel  10 mg Oral QPM  . [START ON 09/16/2013] predniSONE  40 mg Oral Q breakfast  . sodium chloride  1 g Oral TID WC  . valACYclovir  500 mg Oral Daily    Family History Family History  Problem Relation Age of Onset  . Heart failure Father   . Leukemia Brother 21  . Cancer - Lung Mother      Social History History   Social History  . Marital Status: Married    Spouse Name: N/A    Number of Children: N/A  . Years of Education: N/A    Occupational History  . Not on file.   Social History Main Topics  . Smoking status: Former Smoker -- 0.50 packs/day for 13 years    Types: Cigarettes    Quit date: 07/01/1969  . Smokeless tobacco: Never Used  . Alcohol Use: No  . Drug Use: No  . Sexual Activity: Not Currently   Other Topics Concern  . Not on file   Social History Narrative  . No narrative on file     Review of Systems  General:  No chills, night sweats or weight changes. Fever prior to admission, however no fever in recent wk.  Cardiovascular:  No chest pain, dyspnea on exertion, palpitations.  Dermatological: No rash, lesions/masses Respiratory: No cough. Increasing dyspnea Urologic: No dysuria. Redness in folley bag started today Abdominal:   No nausea, vomiting, diarrhea, bright red blood per rectum, melena, or hematemesis Neurologic:  No  visual changes. Weakness since this am along with AMS All other systems reviewed and are otherwise negative except as noted above.  Physical Exam  Blood pressure 114/76, pulse 86, temperature 97.5 F (36.4 C), temperature source Oral, resp. rate 20, weight 143 lb 9.6 oz (65.137 kg), SpO2 100.00%.  General: frail, weak appearing Psych: Normal affect. Neuro: Moves all extremities spontaneously. Eyes closed, slow to respond to question HEENT: Normal  Neck: Supple without bruits. + JVD Lungs:  Resp regular and unlabored, CTA with mild bibasilar rale Heart: Regularly irregular, +S3, 2/6 systolic murmur, no s4 Abdomen: Soft, non-tender, non-distended, BS + x 4.  Extremities: No clubbing, cyanosis or edema. DP/PT/Radials 2+ and equal bilaterally.  Musculoskeletal:  The patient is frail and thin. Skin:  There no significant rashes.  Labs   Recent Labs  09/15/13 0907 09/15/13 1428  CKTOTAL 52  --   CKMB 4.8*  --   TROPONINI  --  <0.30   Lab Results  Component Value Date   WBC 21.4* 09/15/2013   HGB 8.2* 09/15/2013   HCT 23.0* 09/15/2013   MCV 100.9*  09/15/2013   PLT 212 09/15/2013    Recent Labs Lab 09/15/13 0907  NA 123*  K 5.3  CL 86*  CO2 23  BUN 62*  CREATININE 2.36*  CALCIUM 10.1  GLUCOSE 146*    Radiology/Studies  Dg Chest 2 View  09/09/2013   CLINICAL DATA:  Increased sputum production  EXAM: CHEST  2 VIEW  COMPARISON:  09/07/2013  FINDINGS: Pneumomediastinum and neck base subcutaneous emphysema is stable. No convincing pneumothorax.  Irregular interstitial and airspace densities in the perihilar and lower lungs is stable. No pleural effusion.  Cardiac silhouette is normal in size. Changes from cardiac surgery are stable.  IMPRESSION: 1. No change from the prior study. Persistent pneumomediastinum and neck base subcutaneous emphysema. Persistent ill-defined interstitial and airspace opacities consistent with the focal pneumonia.   Electronically Signed   By: Lajean Manes M.D.   On: 09/09/2013 16:54   Dg Chest 2 View  09/06/2013   CLINICAL DATA:  Right-sided pneumothorax  EXAM: CHEST  2 VIEW  COMPARISON:  Portable chest of Sep 05, 2013. And barium esophagram of the same day  FINDINGS: No pneumothorax visible. Small amounts of mediastinal air the in right perihilar region and in bilateral upper paratracheal region similar to prior. No pleural effusion. Minimal prominence of pulmonary interstitium. Cardiac silhouette and pulmonary vascularity within limits of normal.  IMPRESSION: 1. No visible right pneumothorax.  Pneumomediastinum persists. 2. Mild stable pulmonary interstitial edema.   Electronically Signed   By: David  Martinique   On: 09/06/2013 09:41   Dg Chest 2 View  09/04/2013   CLINICAL DATA:  Shortness of breath.  EXAM: CHEST  2 VIEW  COMPARISON:  June 17, 2013.  FINDINGS: Stable cardiomediastinal silhouette. Status [name] coronary artery bypass graft. No pneumothorax or significant pleural effusion is noted. Interval development of bilateral perihilar and basilar interstitial densities are noted concerning for pulmonary  edema.  IMPRESSION: Interval development of bilateral perihilar and basilar interstitial densities concerning for pulmonary edema or less likely pneumonia.   Electronically Signed   By: Sabino Dick M.D.   On: 09/04/2013 16:01   Ct Angio Chest Pe W/cm &/or Wo Cm  09/04/2013   CLINICAL DATA:  Shortness of breath, tachycardia, hypoxia. History of DVT.  EXAM: CT ANGIOGRAPHY CHEST WITH CONTRAST  TECHNIQUE: Multidetector CT imaging of the chest was performed using the standard protocol during bolus administration  of intravenous contrast. Multiplanar CT image reconstructions and MIPs were obtained to evaluate the vascular anatomy.  CONTRAST:  OMNIPAQUE IOHEXOL 350 MG/ML SOLN  COMPARISON:  05/12/2013  FINDINGS: THORACIC INLET/BODY WALL:  Emphysema.  MEDIASTINUM:  Large volume of pneumomediastinum, extending into the neck.  No cardiomegaly. No pericardial effusion. Extensive atherosclerosis, including the coronary arteries. Status [name] CABG with LIMA and upper venous graft widely patent. The graft associated with the lower ostium marker is not clearly visible, presumably chronic given history. There is occasional respiratory motion, which decreases sensitivity at the affected levels. Overall, exam is diagnostic. There is no evidence of acute pulmonary embolism. No evidence of esophageal thickening. No fluid collection. No adenopathy.  LUNG WINDOWS:  Small right apical pneumothorax, less than 5%.  There is dependent ground-glass and fine interstitial opacities. Changes are similar to CT 08/25/2013. No pleural effusion or interlobular septal thickening.  UPPER ABDOMEN:  Presumed cyst in the upper right liver, stable from previous imaging.  OSSEOUS:  No acute fracture.  No suspicious lytic or blastic lesions.  Critical Value/emergent results were called by telephone at the time of interpretation on 09/04/2013 at 5:34 PM to Dr. Aundra Millet, [facility] , who verbally acknowledged these results.  Review of the MIP images confirms  the above findings.  IMPRESSION: 1. Pneumomediastinum and trace right apical pneumothorax (less than 5%). This is likely from pulmonic air leak given #2. 2. Unchanged ground-glass opacities in the lower lungs, favoring an inflammatory (including drug reaction) pneumonitis or atypical infection. Given immunocompromised state (in the setting of lymphoma), consider PCP pneumonia - which can predispose to pneumothorax). 3. Negative for acute pulmonary embolism.   Electronically Signed   By: Tiburcio Pea M.D.   On: 09/04/2013 17:41   Nm Pet Image Restag (ps) Skull Base To Thigh  08/25/2013   CLINICAL DATA:  Subsequent treatment strategy for lymphoma.  EXAM: NUCLEAR MEDICINE PET SKULL BASE TO THIGH  TECHNIQUE: 7.3 mCi F-18 FDG was injected intravenously. Full-ring PET imaging was performed from the skull base to thigh after the radiotracer. CT data was obtained and used for attenuation correction and anatomic localization.  FASTING BLOOD GLUCOSE:  Value: 96 mg/dl  COMPARISON:  PET-CT scan 06/21/2013  FINDINGS: NECK  No hypermetabolic lymph nodes in the neck.  CHEST  There is new hypermetabolic activity associated with ground-glass opacities primarily in the lower lobes but also scattered within the upper lobes. No mass lesion associated with the these ground-glass hypermetabolic metastases.  The hypermetabolic activity extends into the inferior pleural surfaces.  There several small nodules in the upper lobes. For example 4 mm nodule (image 64 or, series 4)in the right upper lobe. The small nodules are unchanged from prior.  ABDOMEN/PELVIS  No abnormal metabolic activity within the liver or spleen. Spleen is normal volume. Interval resolution of the hypermetabolic activity within the right inguinal lymph nodes. The lymph nodes have hypermetabolic activity less than mediastinum. No measurable enlarged lymph nodes remain.  SKELETON  No focal hypermetabolic activity to suggest skeletal metastasis.  IMPRESSION: 1.  Interval resolution of hypermetabolic right inguinal lymph nodes ( [place] 1). 2. New extensive hypermetabolic ground-glass opacities predominantly in the lower lobes. Legrand Rams this represent to be a drug reaction or other inflammatory or infectious process. Do not favor lymphoma recurrence.  Findings conveyed tocancer center triage nurse, Susanon 08/25/2013 at11:17.   Electronically Signed   By: Genevive Bi M.D.   On: 08/25/2013 11:20   Dg Chest Port 1 View  09/15/2013  CLINICAL DATA:  Congestive heart failure.  EXAM: PORTABLE CHEST - 1 VIEW  COMPARISON:  09/09/2013 and previous  FINDINGS: There has been previous median sternotomy and CABG. The heart is enlarged. There is interstitial and early alveolar edema consistent with acute congestive heart failure. This has worsened since 09/09/2013. No detectable effusion.  IMPRESSION: Interstitial and alveolar edema consistent with acute congestive heart failure, worsened since 05/23.   Electronically Signed   By: Nelson Chimes M.D.   On: 09/15/2013 12:29   Dg Chest Port 1 View  09/07/2013   CLINICAL DATA:  Short of breath  EXAM: PORTABLE CHEST - 1 VIEW  COMPARISON:  09/06/2013  FINDINGS: Progression of pneumomediastinum, increased subcutaneous gas in the neck.  Negative for pneumothorax  Bibasilar airspace disease unchanged from the prior study. No effusion.  IMPRESSION: Progression of pneumomediastinum and gas in the neck. No pneumothorax.  Bibasilar airspace disease unchanged.   Electronically Signed   By: Franchot Gallo M.D.   On: 09/07/2013 16:27   Dg Chest Port 1 View  09/05/2013   CLINICAL DATA:  Pneumothorax, short of breath, nausea  EXAM: PORTABLE CHEST - 1 VIEW  COMPARISON:  Prior chest x-ray and CT chest 09/04/2013  FINDINGS: Stable trace right apical pneumothorax. The mediastinal air is again a present and tracks into the soft tissues of the right greater than left neck. No significant interval exacerbation. Stable cardiac and mediastinal contours.  Patient is status [name] median sternotomy with evidence of prior multivessel CABG including LIMA bypass. Similar to slightly increased left greater than right bibasilar patchy airspace opacities. No pleural effusion. No acute osseous abnormality.  IMPRESSION: 1. Stable pneumomediastinum in trace right apical pneumothorax. 2. Similar to slightly increased left greater than right bibasilar opacities. Differential considerations remain unchanged including pneumonitis and atypical infection.   Electronically Signed   By: Jacqulynn Cadet M.D.   On: 09/05/2013 08:18   Dg Esophagus W/water Sol Cm  09/05/2013   CLINICAL DATA:  Evaluate for esophageal rupture  EXAM: ESOPHOGRAM/BARIUM SWALLOW  TECHNIQUE: Single contrast examination was performed using water-soluble contrast.  FLUOROSCOPY TIME:  55 seconds  50 cc Omnipaque 300  COMPARISON:  09/05/2013  FINDINGS: The esophagus is patent. No stricture or mass identified. No abnormal extraluminal extravasation of contrast identified to suggest esophageal leak/ rupture.  IMPRESSION: Negative exam.   Electronically Signed   By: Kerby Moors M.D.   On: 09/05/2013 15:26    ECG  4:1 a-flutter with RBBB, HR 80s  ASSESSMENT AND PLAN  1. Proxysmal atrial flutter  - in the setting of worsening respiratory insufficiency and anemia  - difficult situation, no anticoagulation given worsening anemia. Monitor for now  - Currently rate controlled on BB, although current on metoprolol tartrate (short acting) daily with borderline SBP. Will consider BID dosing if BP stable or rate uncontrolled at night  2. Acute on chronic diastolic HF with acute resp insufficiency  - continue diuresis,.  There has been significant diuresis today. Plan to follow renal function and respiratory function carefully.  Patient is scheduled to receive a small dose of Lasix tomorrow morning.  - Echo 5/20 EF 55-60%, no regional wall motion abnormality. Moderate MR with eccentric jet.    - Echo  05/31/2012 EF 55-60%, grade II diastolic dysfunction.   3. CAD       STABLE   4.  CKD class III-IV  - worsening Cr may be related to fluid overload. Hopefully renal function will improve with diuresis  5. h/o nonhodgkin's  lymphoma  - diagnosed in 2003 following unusual DVT  - recurred 2009  - recurred Feb/Mar 2015, confirmed with 2 biopsy  6. HTN 7. MR worsened by CAD per Dr. Gwenlyn [name] 8. Acute on Chronic anemia  - hgb trending down, foley bag appeared red. Currently there is gross hematuria.   9. R apical pneumothorax and pneumomediastinum  - seen by pulm for R apical pneumothorax, conservative management  - gastrografin no leak to explain pneumomediastinum, conservative management  10. Hematuria          as the day has gone on today the patient has had worsening hematuria.  At this time, I feel it is important for [place] to follow the patient's atrial flutter. We will also help follow his volume status. Unfortunately he has multiple medical problems. His primary team will be overseeing all of these.   Hilbert Corrigan, [place] 09/15/2013, 3:57 PM Pager [phone] Patient seen and examined. I agree with the assessment and plan as detailed above. See also my additional thoughts below.   I reviewed all of the data with Willie [name]. I have reviewed the note above completely. I have made modifications. I examine the patient. I discussed the situation with 2 family members. I also called the patient's primary in-hospital physician. We will watch his atrial flutter. The rate is controlled. We will watch his diuresis and renal function. He cannot be anticoagulated currently for his atrial flutter. He has gross hematuria.  Carlena Bjornstad, MD, [facility] 09/15/2013 5:48 PM

## 2013-09-15 NOTE — Progress Notes (Signed)
Physical Therapy Note  Patient Details  Name: Willie [name] MRN: [id number] Date of Birth: [birth year] Today's Date: 09/15/2013  Patient missed 45 min of skilled physical therapy due to being on bed rest per MD note and medically unstable for therapy per RN. Will f/u as able.    Laretta Alstrom  09/15/2013, 3:01 PM

## 2013-09-15 NOTE — Progress Notes (Addendum)
Addendum:  Urine in foley is cherry red. Hgb has been trending down in general. Have ordered another CBC in am. Ucx is pending, UA equivocal for infection. Will hold abx at this point. Continue with foley catheter. Will also check next stool for occult blood given that he's had a decrease in his H/H prior to foley placement.  Hold lovenox for now. Pt appears stable to be on unit for now. Await CCS evaluation. Spoke with RN and cardiology this evening   Meredith Staggers, MD, [place]

## 2013-09-15 NOTE — Progress Notes (Signed)
Speech Language Pathology Daily Session Note  Patient Details  Name: Willie [name] MRN: [id number] Date of Birth: 1941/01/18  Today's Date: 09/15/2013 Time: 5027-7412 Time Calculation (min): 40 min  Short Term Goals: Week 1: SLP Short Term Goal 1 (Week 1): Pt will utilize external memory aids to recall new, daily information with Min A multimodal cues.  SLP Short Term Goal 2 (Week 1): Pt will demonstrate functioanl problem solving for basic and familiar tasks with supervision multimodal cues.   Skilled Therapeutic Interventions:  Pt was seen for skilled speech therapy targeting dysphagia management and memory.  Per pt's RN and primary SLP, pt has been exhibiting soft s/s of aspiration characterized by a throat clear and wet vocal quality following sips of thin liquids via straw.  Pt was observed with sips of thin liquids via straw and was noted to consistently clear his throat, intermittently wet voice.  Throat clearing was minimized to intermittent with small sips of thin liquids via cup sips.  SLP educated pt and pt's wife regarding no straws recommendation and both were agreeable and verbalized understanding.  Pt exhibited no difficulty orally manipulating and clearing regular solids, no overt s/s of aspiration.  Pt was very fatigued during today's session and required mod-max cuing for processing speed and initiation.  Pt also benefitted from max cuing for organization, processing, and working memory to enter daily events and information into his memory book to facilitate improved recall of new information.  Continue per current plan of care.  FIM:  Comprehension Comprehension Mode: Auditory Comprehension: 5-Understands basic 90% of the time/requires cueing < 10% of the time Expression Expression Mode: Verbal Expression: 5-Expresses basic 90% of the time/requires cueing < 10% of the time. Social Interaction Social Interaction: 4-Interacts appropriately 75 - 89% of the time - Needs  redirection for appropriate language or to initiate interaction. Problem Solving Problem Solving: 4-Solves basic 75 - 89% of the time/requires cueing 10 - 24% of the time Memory Memory: 2-Recognizes or recalls 25 - 49% of the time/requires cueing 51 - 75% of the time FIM - Eating Eating Activity: 5: Supervision/cues  Pain Pain Assessment Pain Assessment: No/denies pain Pain Score: 0-No pain  Therapy/Group: Individual Therapy  Willie [name], M.A. CCC-SLP   Willie [name] 09/15/2013, 10:56 AM

## 2013-09-15 NOTE — Progress Notes (Signed)
[facility] PHYSICAL MEDICINE & REHABILITATION     PROGRESS NOTE    Subjective/Complaints: Progressing with therapies. Denies problems. No sob, cp A 12 point review of systems has been performed and if not noted above is otherwise negative.   Objective: Vital Signs: Blood pressure 125/77, pulse 92, temperature 97.4 F (36.3 C), temperature source Oral, resp. rate 18, weight 65.137 kg (143 lb 9.6 oz), SpO2 100.00%. No results found.  Recent Labs  09/12/13 1930  WBC 16.6*  HGB 9.0*  HCT 25.3*  PLT 220    Recent Labs  09/12/13 0856 09/12/13 1930 09/14/13 0700  NA 124*  --  121*  K 4.8  --  5.4*  CL 87*  --  85*  GLUCOSE 109*  --  101*  BUN 44*  --  57*  CREATININE 1.70* 2.05* 2.51*  CALCIUM 10.3  --  10.0   CBG (last 3)  No results found for this basename: GLUCAP,  in the last 72 hours  Wt Readings from Last 3 Encounters:  09/15/13 65.137 kg (143 lb 9.6 oz)  09/10/13 60.782 kg (134 lb)  08/30/13 60.51 kg (133 lb 6.4 oz)    Physical Exam:   Constitutional: He is oriented to person, place, and time.  73 year old frail Caucasian male  HENT: mucosa pink/moist, tongue a little dry  Head: Normocephalic.  Eyes: EOM are normal.  Neck: Normal range of motion. Neck supple. No thyromegaly present.  Cardiovascular: Normal rate and regular rhythm. Systolic murmur  Respiratory:  Breathing appears comfortable. Non-labored. No rales GI: Soft. Bowel sounds are normal. He exhibits no distension.  Neurological: He is alert and oriented to person, place, and time.  motor strength is 4+/5 bilateral deltoid, bicep, tricep, grip, hip flexors, knee extensors, ankle dorsiflexion plantar flexion  Sensation is intact to light touch and proprioception bilateral upper and lower limbs  Patient moves with delay, needs repeated cueing at times.  Psych: affect flat, cooperative.     Assessment/Plan: 1. Functional deficits secondary to deconditioning after pneumonia/respiratory failure  which require 3+ hours per day of interdisciplinary therapy in a comprehensive inpatient rehab setting. Physiatrist is providing close team supervision and 24 hour management of active medical problems listed below. Physiatrist and rehab team continue to assess barriers to discharge/monitor patient progress toward functional and medical goals. FIM: FIM - Bathing Bathing Steps Patient Completed: Chest;Right Arm;Left Arm;Abdomen;Front perineal area;Buttocks;Right upper leg;Left upper leg Bathing: 4: Min-Patient completes 8-9 27f 10 parts or 75+ percent  FIM - Upper Body Dressing/Undressing Upper body dressing/undressing steps patient completed: Thread/unthread right sleeve of pullover shirt/dresss;Thread/unthread left sleeve of pullover shirt/dress;Put head through opening of pull over shirt/dress;Pull shirt over trunk Upper body dressing/undressing: 5: Set-up assist to: Obtain clothing/put away FIM - Lower Body Dressing/Undressing Lower body dressing/undressing steps patient completed: Pull underwear up/down;Pull pants up/down Lower body dressing/undressing: 1: Total-Patient completed less than 25% of tasks  FIM - Toileting Toileting steps completed by patient: Performs perineal hygiene;Adjust clothing prior to toileting Toileting Assistive Devices: Grab bar or rail for support Toileting: 1: Two helpers  FIM - Radio producer Devices: Product manager Transfers: 3-From toilet/BSC: Mod A (lift or lower assist)  FIM - Control and instrumentation engineer Devices: Bed rails;Arm rests Bed/Chair Transfer: 3: Bed > Chair or W/C: Mod A (lift or lower assist)  FIM - Locomotion: Wheelchair Distance: 60 Locomotion: Wheelchair: 0: Activity did not occur FIM - Locomotion: Ambulation Locomotion: Ambulation Assistive Devices: Administrator Ambulation/Gait Assistance:  4: Min guard Locomotion: Ambulation: 0: Activity did not  occur  Comprehension Comprehension Mode: Auditory Comprehension: 5-Understands complex 90% of the time/Cues < 10% of the time  Expression Expression Mode: Verbal Expression: 5-Expresses basic 90% of the time/requires cueing < 10% of the time.  Social Interaction Social Interaction: 5-Interacts appropriately 90% of the time - Needs monitoring or encouragement for participation or interaction.  Problem Solving Problem Solving: 4-Solves basic 75 - 89% of the time/requires cueing 10 - 24% of the time  Memory Memory: 3-Recognizes or recalls 50 - 74% of the time/requires cueing 25 - 49% of the time Medical Problem List and Plan:  1. Functional deficits secondary to deconditioning after pneumonia, respiratory failure with history of non-Hodgkin's lymphoma  2. DVT Prophylaxis/Anticoagulation: Subcutaneous Lovenox. Monitor platelet counts any signs of bleeding  3. Pain Management: Hydrocodone as needed. Monitor with increased mobility  4. Mood: Bouts of confusion discussed baseline with family  5. Neuropsych: This patient is not capable of making decisions on his own behalf.  6. Non-Hodgkin's lymphoma. Followup per Dr.Gorsuch. 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 Chemotherapy program to be reevaluated as outpatient   -labs stable---pending for today 7. Hypertension. Lopressor 25 mg daily. Monitor with increased mobility  8. CAD with CABG. No chest pain or shortness of breath. Continue Effient 10 mg daily as well as aspirin  9. Hyperlipidemia. Lipitor  10. Chronic renal insufficiency. Baseline CR around 1.5---has been trending up. May need NS IVF 11. 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 Sodium 121-126. May need to continue sodium chloride tablets.    -Fluid restriction to continue as well.       LOS (Days) 3 A FACE TO FACE EVALUATION WAS PERFORMED  Willie [name] 09/15/2013 7:56 AM

## 2013-09-15 NOTE — Progress Notes (Signed)
Physical Therapy Note  Patient Details  Name: LAWSON MAHONE MRN: [id number] Date of Birth: 1941/02/10 Today's Date: 09/15/2013  Time: 7:42 - 8:17 35 minutes  1:1, no c/o pain.  Missed first 12 minutes of session due to pt eating breakfast.  Pt received sitting in recliner on 2L, spO2 92% at rest.  Sit <> stand using RW with min A, ambulated ~15' x 2 to bathroom using RW with mod A, max A for turning.  Min A for standing balance to don/doff pants.  Upon return from bathroom, pt c/o fatigue and needed to sit in recliner, switched to 4L O2.  Refused to walk to w/c, required max coaxing to participate further in session.  Pt performed seated balance for 60 seconds with min A for posterior support, focusing on posture and abdominal strength.  Performed sit <> stand with min A, standing balance for 30 seconds with min A, focusing on posture and tolerance, limited by fatigue.  SpO2 > 90% on 4L throughout session.  Pt required increased cueing for deep nose inhalation and pursed lip breathing.  Pt demonstrated increased anxiety with standing and walking.  Pt left sitting in recliner on 2L with all needs met and bell within reach, spO2 at 91%.  Kenn File 09/15/2013, 8:18 AM

## 2013-09-15 NOTE — Progress Notes (Signed)
Reviewed and in agreement with treatment provided.  

## 2013-09-15 NOTE — Progress Notes (Signed)
At approx. 1120 Patients wife asked nursing staff to help toilet patient.  Patient has been very fatigued today, so we decided we would simply stand and use urinal instead of ambulating to restroom.  Nursing staff on both sides of patient, helping patient keep balance with RW.  Patient noted to have increased labored breathing, and encouraged patient to breathe through nose.  Patient then became unsteady on feet, nearly falling backwards, nursing staff assisted patient back to sitting position in chair.  Patient noted to have agonal respirations, became unresponsive, was increasingly pale and diaphoretic.  Rapid response called.  Patient became responsive after about 1 minute.  VS checked, BP 155/53; HR 121; O2 sats 88% on 2L via [place].  Pulse noted to be irregular, a change from this mornings assessment.  Checked blood sugar: 183.  Rapid response nurse arrived and assisted in patient condition.  Patients condition stabilized, although still extremely fatigued, falling asleep. Marlowe Shores, PA at bedside along with patients wife. Stat CXR ordered and performed.  IV ordered to be discontinued, discontinued by primary nurse.  Orders for IV lasix 40mg  administered via Rapid response nurse.  Patient assisted back to bed.  Patient appears to be in no immediate distress at this time.  Patients family at the bedside.  Awaiting pulmonology consult.  Brita Romp, RN

## 2013-09-15 NOTE — Progress Notes (Signed)
Occupational Therapy Session Note  Patient Details  Name: Willie [name] MRN: [id number] Date of Birth: 14-Aug-1940  Today's Date: 09/15/2013 Time: 7124-5809 Time Calculation (min): 23 min  Short Term Goals: Week 1:  OT Short Term Goal 1 (Week 1): STGs = LTGs due to short LOS  Skilled Therapeutic Interventions/Progress Updates:    Pt seen for individual OT treatment session today for ADL retraining w/ focus on activity tolerance and endurance related to ADL's and functional transfers. Pt required maximal encouragement for minimal participation related to grooming today. Pt stated that he was physically unable to transfer from recliner to w/c in room for bathe/dressing tasks at sink level. O2 assessed @ 100% on 2 L via nasal cannula. Discussed pursed lip breathing techniques w/ pt whom was unable to replicate despite maximal cues and demonstration, he is noted to take short quick breaths. Pt agreeable only to washing his face and hands after set up at recliner chair in room. Pt falling asleep intermittently throughout session. Discussed energy conservation techniques and importance of participation in therapy to assist with increasing endurance/activity tolerance. Pt states "I know what you would like me to do, I absolutely cannot do it." Pt declined all other OT, discussed pt status w/ RN whom stated MD has been made aware. Pt was repositioned in recliner to elevate feet, call bell in reach and spouse in room.   Therapy Documentation Precautions:  Precautions Precautions: Fall Precaution Comments: Respiratory Sats Restrictions Weight Bearing Restrictions: No General: General Amount of Missed OT Time (min): 22 Minutes Vital Signs: Therapy Vitals Temp: 97.4 F (36.3 C) Temp src: Oral Pulse Rate: 92 Resp: 18 BP: 125/77 mmHg Patient Position (if appropriate): Lying Oxygen Therapy SpO2: 100 % O2 Device: Nasal cannula O2 Flow Rate (L/min): 2 L/min Pain: Pain Assessment Pain  Assessment: No/denies pain Pain Score: 0-No pain ADL: ADL ADL Comments: Refer to FIM     See FIM for current functional status  Therapy/Group: Individual Therapy  Irisa Grimsley B Caren Garske 09/15/2013, 9:03 AM

## 2013-09-15 NOTE — Progress Notes (Signed)
Addendum:  Pt with increased fatigue this am and difficulties breathing. Labs revealed worsening renal function. CXR with CHF. EKG shows ST elevation, tachycardia.   Pt given 40mg  iv lasix. Oxygen on board to keep sats greater than 90%. Stat cardiac enzymes ordered  CCS and now cardiology consulted for input.   Bedrest for now.    Meredith Staggers, MD, [place]

## 2013-09-15 NOTE — Significant Event (Signed)
Rapid Response Event Note  Overview: Time Called: 1120 Arrival Time: 1122 Event Type: Respiratory;Cardiac  Initial Focused Assessment: Per staff, while patient standing using urinal.  Patient suddenly became unresponsive, assisted patient back to chair.  RN noticed rapid irregular heart rate about 120s.  O2 sats 88%. Initially agonal respirations.  Did not respond for about a minute.  BP 155/53  HR 121 RR 24 O2 sat 88% on 2L [place] Upon my arrival patient sitting in chair, responsive but very fatigued.  Labored breathing, crackles ,  Murmur. Pulse occasionally irregular.  Placed on heart monitor.  SR with PACs BP 105-114/50s  SR 80s, RR 24  O2 sat 100% on 2l [place]   Interventions: PCXR done: pulmonary edema 40mg  lasix IV. Patient remains exteremly fatigued, assisted to bed. Wife and daughter at bedside Will continue to monitor.  Event Summary: Name of Physician Notified: Marlowe Shores PA at bedside at    Name of Consulting Physician Notified: Dr Zubellinski/CCM at [address]

## 2013-09-16 ENCOUNTER — Inpatient Hospital Stay (HOSPITAL_COMMUNITY): Payer: Medicare Other

## 2013-09-16 ENCOUNTER — Inpatient Hospital Stay (HOSPITAL_COMMUNITY): Payer: Medicare Other | Admitting: Occupational Therapy

## 2013-09-16 ENCOUNTER — Inpatient Hospital Stay (HOSPITAL_COMMUNITY): Payer: Medicare Other | Admitting: Speech Pathology

## 2013-09-16 DIAGNOSIS — N183 Chronic kidney disease, stage 3 unspecified: Secondary | ICD-10-CM

## 2013-09-16 DIAGNOSIS — I4892 Unspecified atrial flutter: Secondary | ICD-10-CM

## 2013-09-16 DIAGNOSIS — J9600 Acute respiratory failure, unspecified whether with hypoxia or hypercapnia: Secondary | ICD-10-CM

## 2013-09-16 DIAGNOSIS — I2510 Atherosclerotic heart disease of native coronary artery without angina pectoris: Secondary | ICD-10-CM

## 2013-09-16 DIAGNOSIS — C8445 Peripheral T-cell lymphoma, not classified, lymph nodes of inguinal region and lower limb: Secondary | ICD-10-CM

## 2013-09-16 DIAGNOSIS — N189 Chronic kidney disease, unspecified: Secondary | ICD-10-CM

## 2013-09-16 DIAGNOSIS — R5381 Other malaise: Secondary | ICD-10-CM

## 2013-09-16 LAB — CBC
HEMATOCRIT: 20.2 % — AB (ref 39.0–52.0)
HEMOGLOBIN: 7.2 g/dL — AB (ref 13.0–17.0)
MCH: 36.7 pg — ABNORMAL HIGH (ref 26.0–34.0)
MCHC: 35.6 g/dL (ref 30.0–36.0)
MCV: 103.1 fL — ABNORMAL HIGH (ref 78.0–100.0)
Platelets: 170 10*3/uL (ref 150–400)
RBC: 1.96 MIL/uL — ABNORMAL LOW (ref 4.22–5.81)
RDW: 18.3 % — AB (ref 11.5–15.5)
WBC: 16.6 10*3/uL — AB (ref 4.0–10.5)

## 2013-09-16 LAB — URINE CULTURE
COLONY COUNT: NO GROWTH
CULTURE: NO GROWTH

## 2013-09-16 LAB — BASIC METABOLIC PANEL
BUN: 58 mg/dL — AB (ref 6–23)
CHLORIDE: 91 meq/L — AB (ref 96–112)
CO2: 30 mEq/L (ref 19–32)
CREATININE: 2.08 mg/dL — AB (ref 0.50–1.35)
Calcium: 9.2 mg/dL (ref 8.4–10.5)
GFR, EST AFRICAN AMERICAN: 35 mL/min — AB (ref >90)
GFR, EST NON AFRICAN AMERICAN: 30 mL/min — AB (ref >90)
Glucose, Bld: 84 mg/dL (ref 70–99)
POTASSIUM: 3.8 meq/L (ref 3.7–5.3)
Sodium: 131 mEq/L — ABNORMAL LOW (ref 137–147)

## 2013-09-16 LAB — PREPARE RBC (CROSSMATCH)

## 2013-09-16 MED ORDER — FUROSEMIDE 10 MG/ML IJ SOLN
40.0000 mg | Freq: Once | INTRAMUSCULAR | Status: AC
  Administered 2013-09-16: 40 mg via INTRAVENOUS
  Filled 2013-09-16: qty 4

## 2013-09-16 MED ORDER — ACETAMINOPHEN 325 MG PO TABS
650.0000 mg | ORAL_TABLET | Freq: Once | ORAL | Status: AC
  Administered 2013-09-16: 650 mg via ORAL
  Filled 2013-09-16: qty 2

## 2013-09-16 NOTE — Progress Notes (Signed)
Pt tired/weak today and unable to participate in therapy. Becomes SOB with minimal exertion, such as feeding self.  However, VS stable throughout this shift and no complaints of pain or increasing SOB, etc. Blood transfusion started per order at 1650 and pt tolerating well thus far with blood transfusing at 100cc/hr. No signs of fluid overload. Foley bag emptied at 1500 and noted at 1700 to not have drained any urine; called and obtained order to irrigate foley. Urine had became yellow and clear after AM dose of lasix, but has since reverted back to bright red in color. Wife at beside, attentive and assisting with care. Continue to monitor. Hortencia Conradi RN

## 2013-09-16 NOTE — Progress Notes (Addendum)
[facility] [facility] PHYSICAL MEDICINE & REHABILITATION     PROGRESS NOTE    Subjective/Complaints: Unable to do therapy this am  Denies problems. No sob, cp, no cough, feels weak, no limb pain no nausea,vomiting or abdominal pain Appreciate cardiology and CCM notes   review of systems has been performed and if not noted above is otherwise negative.   Objective: Vital Signs: Blood pressure 125/67, pulse 101, temperature 97.8 F (36.6 C), temperature source Oral, resp. rate 22, weight 62.052 kg (136 lb 12.8 oz), SpO2 100.00%. Dg Chest Port 1 View  09/15/2013   CLINICAL DATA:  Congestive heart failure.  EXAM: PORTABLE CHEST - 1 VIEW  COMPARISON:  09/09/2013 and previous  FINDINGS: There has been previous median sternotomy and CABG. The heart is enlarged. There is interstitial and early alveolar edema consistent with acute congestive heart failure. This has worsened since 09/09/2013. No detectable effusion.  IMPRESSION: Interstitial and alveolar edema consistent with acute congestive heart failure, worsened since 05/23.   Electronically Signed   By: Nelson Chimes M.D.   On: 09/15/2013 12:29    Recent Labs  09/15/13 0907 09/16/13 0702  WBC 21.4* 16.6*  HGB 8.2* 7.2*  HCT 23.0* 20.2*  PLT 212 170    Recent Labs  09/15/13 0907 09/16/13 0702  NA 123* 131*  K 5.3 3.8  CL 86* 91*  GLUCOSE 146* 84  BUN 62* 58*  CREATININE 2.36* 2.08*  CALCIUM 10.1 9.2   CBG (last 3)   Recent Labs  09/15/13 1123  GLUCAP 186*    Wt Readings from Last 3 Encounters:  09/16/13 62.052 kg (136 lb 12.8 oz)  09/10/13 60.782 kg (134 lb)  08/30/13 60.51 kg (133 lb 6.4 oz)    Physical Exam:   Constitutional: He is oriented to person, place, and time.  73 year old frail Caucasian male  HENT: mucosa pink/moist, tongue a little dry  Head: Normocephalic.  Eyes: EOM are normal.  Neck: Normal range of motion. Neck supple. No thyromegaly present.  Cardiovascular: Normal rate and regular rhythm. Systolic  murmur  Respiratory:  Breathing appears comfortable. Non-labored. No rales GI: Soft. Bowel sounds are normal. He exhibits no distension.  Neurological: He is alert and oriented to person, place, and time.  motor strength is 4+/5 bilateral deltoid, bicep, tricep, grip, hip flexors, knee extensors, ankle dorsiflexion plantar flexion  Sensation is intact to light touch and proprioception bilateral upper and lower limbs  Patient moves with delay, needs repeated cueing at times.  Psych: affect flat, cooperative.     Assessment/Plan: 1. Functional deficits secondary to deconditioning after pneumonia/respiratory failure which require 3+ hours per day of interdisciplinary therapy in a comprehensive inpatient rehab setting. Physiatrist is providing close team supervision and 24 hour management of active medical problems listed below. Physiatrist and rehab team continue to assess barriers to discharge/monitor patient progress toward functional and medical goals. Pt with multiple medical issues, limited his therapy starting yesterday, appreciate cardiology and CCM consults. Pt with exacerbation of CHF, fluid overload, now with increased  anemia from hematuria, will need to confer with cardiology in regards to possible transfusion. Bedside therapy for now, hopefully will stabilize over the next 48 hours  FIM: FIM - Bathing Bathing Steps Patient Completed: Chest;Right Arm;Left Arm;Abdomen;Front perineal area;Buttocks;Right upper leg;Left upper leg Bathing: 0: Activity did not occur (Pt reports that he physically cannot participate in ADL's, too weak. )  FIM - Upper Body Dressing/Undressing Upper body dressing/undressing steps patient completed: Thread/unthread right sleeve of  pullover shirt/dresss;Thread/unthread left sleeve of pullover shirt/dress;Put head through opening of pull over shirt/dress;Pull shirt over trunk Upper body dressing/undressing: 0: Activity did not occur (Pt states he physically  cannot participate in ADl's, too weak) FIM - Lower Body Dressing/Undressing Lower body dressing/undressing steps patient completed: Pull underwear up/down;Pull pants up/down Lower body dressing/undressing: 0: Activity did not occur (Pt unable to participate)  FIM - Toileting Toileting steps completed by patient: Performs perineal hygiene;Adjust clothing prior to toileting Toileting Assistive Devices: Grab bar or rail for support Toileting: 1: Two helpers  FIM - Radio producer Devices: Product manager Transfers: 3-From toilet/BSC: Mod A (lift or lower assist)  FIM - Control and instrumentation engineer Devices: Bed rails;Arm rests Bed/Chair Transfer: 0: Activity did not occur  FIM - Locomotion: Wheelchair Distance: 60 Locomotion: Wheelchair: 0: Activity did not occur FIM - Locomotion: Ambulation Locomotion: Ambulation Assistive Devices: Administrator Ambulation/Gait Assistance: 4: Min guard Locomotion: Ambulation: 0: Activity did not occur  Comprehension Comprehension Mode: Auditory Comprehension: 5-Understands complex 90% of the time/Cues < 10% of the time  Expression Expression Mode: Verbal Expression: 5-Expresses basic 90% of the time/requires cueing < 10% of the time.  Social Interaction Social Interaction: 5-Interacts appropriately 90% of the time - Needs monitoring or encouragement for participation or interaction.  Problem Solving Problem Solving: 4-Solves basic 75 - 89% of the time/requires cueing 10 - 24% of the time  Memory Memory: 3-Recognizes or recalls 50 - 74% of the time/requires cueing 25 - 49% of the time Medical Problem List and Plan:  1. Functional deficits secondary to deconditioning after pneumonia, respiratory failure with history of non-Hodgkin's lymphoma  2. DVT Prophylaxis/Anticoagulation: off lovenox, urine looks concentrated but no longer "cherry red"  3. Pain Management: Hydrocodone as needed. Monitor  with increased mobility  4. Mood: Bouts of confusion likely related to multiple medical issues 5. Neuropsych: This patient is not capable of making decisions on his own behalf.  6. Non-Hodgkin's lymphoma. Followup per Dr.Gorsuch. 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 Chemotherapy program to be reevaluated as outpatient   -labs stable---pending for today 7. Hypertension. Lopressor 25 mg daily. Monitor with increased mobility  8. CAD with CABG. No chest pain or shortness of breath. Continue Effient 10 mg daily as well as aspirin  9. Hyperlipidemia. Lipitor  10. Chronic renal insufficiency. Baseline CR around 1.5---has been trending up. May need NS IVF 11. 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 Sodium 121-126. May need to continue sodium chloride tablets.    -Fluid restriction to continue as well.   12.  Hematuria improved off of lovenox    LOS (Days) 4 A FACE TO FACE EVALUATION WAS PERFORMED  Charlett Blake 09/16/2013 11:27 AM

## 2013-09-16 NOTE — Progress Notes (Signed)
Occupational Therapy Session Note  Patient Details  Name: CORDELRO GAUTREAU MRN: [id number] Date of Birth: 1940/06/29  Today's Date: 09/16/2013  Skilled Therapeutic Interventions/Progress Updates:   Patient missed 45 minutes of OT this morning secondary to deferring from not feeling well.  Daughter present and reported pulmonology, cardiology, and nephrology to consult patient.  Dr. Melvyn Novas placed patient on limited bed therapy this AM.  Encouraged participation for grooming tasks but patient deferred.  Therapy Documentation Precautions:  Precautions Precautions: Fall Precaution Comments: Respiratory Sats Restrictions Weight Bearing Restrictions: No General: General Missed Time Reason: Patient on bedrest (Dr. Melvyn Novas present in PT room, advised limited PT.) Vital Signs: Therapy Vitals Temp: 97.8 F (36.6 C) Temp src: Oral Pulse Rate: 101 Resp: 22 BP: 125/67 mmHg Patient Position (if appropriate): Lying Oxygen Therapy SpO2: 100 % O2 Device: Nasal cannula O2 Flow Rate (L/min): 2 L/min  Osa Craver 09/16/2013, 10:43 AM

## 2013-09-16 NOTE — Progress Notes (Signed)
Speech Language Pathology Daily Session Note  Patient Details  Name: Willie [name] MRN: [id number] Date of Birth: February 16, 1941  Today's Date: 09/16/2013 Time: 1500-1530 Time Calculation (min): 30 min  Short Term Goals: Week 1: SLP Short Term Goal 1 (Week 1): Pt will utilize external memory aids to recall new, daily information with Min A multimodal cues.  SLP Short Term Goal 2 (Week 1): Pt will demonstrate functioanl problem solving for basic and familiar tasks with supervision multimodal cues.   Skilled Therapeutic Interventions: Skilled treatment focused on cognitive goals. SLP spoke with patient's spouse and daughter, who informed SLP that patient has been fatigued today, awaiting blood transfusion secondary to cardiologist recommendation for anemia and atrial flutter, as well as pulmonary edema vs. PNA. Patient participated in conversation with SLP regarding biographical history and recall of recent events. Patient was very fatigued and stated that "sometimes I remember and sometimes I don't" Patient pleasantly declined to write down daily events in his book ("i might do it later"). Fatigue was definitely a factor in today's session, and patient demonstrated recall of today's events with SLP providing question cues to direct patient in recall and use of external memory aids. Patient's spouse stated that patient appeared to have difficulty with swallowing today, as she noticed he seemed to "hold liquids" in his mouth too long. Patient pleasantly refused any liquids, and based on his current functional status and fatigue/lethargy, SLP did not attempt to administer any PO's. Continue with plan of care.   FIM:  Comprehension Comprehension Mode: Auditory Comprehension: 5-Understands complex 90% of the time/Cues < 10% of the time Expression Expression Mode: Verbal Expression: 5-Expresses basic needs/ideas: With extra time/assistive device Social Interaction Social Interaction: 5-Interacts  appropriately 90% of the time - Needs monitoring or encouragement for participation or interaction. Memory Memory: 3-Recognizes or recalls 50 - 74% of the time/requires cueing 25 - 49% of the time  Pain Pain Assessment Pain Assessment: No/denies pain Pain Score: 0-No pain  Therapy/Group: Individual Therapy  Dannial Monarch 09/16/2013, 4:52 PM  Sonia Baller, MA, CCC-SLP [facility] Speech-Language Pathologist

## 2013-09-16 NOTE — Progress Notes (Addendum)
Physical Therapy Session Note  Patient Details  Name: Willie [name] MRN: [id number] Date of Birth: Jul 30, 1940  Today's Date: 09/16/2013 Time: 0900-0935 Time Calculation (min): 35 min  Short Term Goals: Week 1:  PT Short Term Goal 1 (Week 1): STGs=LTGs secondary to ELOS  Skilled Therapeutic Interventions/Progress Updates:  PT: supine in bed. Dr. Melvyn Novas present for 10 minutes discussing PT. condition with family members (significant other, daughter). Dr. advises limited or no activity due to patient condition.  PT states 3/10 pain "all over" and describes as aching. LE pitting present as demonstrated by Dr. Melvyn Novas for family.  PT consented to LE bed exercises in supine with head of bed elevated: 2x20 ankle pumps L/R with tactile cues, 2x5 heel slide L/R, SAQ 2x5 L/R, hip abd 5 x 5 second holds against resistance, hip add 2 x 10 seconds against resistance. Shoulder flexion 5 x 5 second holds. 2L O2 during limited activity with frequent therapeutic recovery breaks.  PT stated he was tired and wants to rest. Family members present, and engaged, concurred with patient's wish. Family members stated they would encourage occasional activity of patient  PT remained in supine with head of bed elevated with three rails up; daughter, AMY, present in room. All needs present and within reach of PT. PT. Stated about afternoon session "we'll see" when notified of therapist return visit/tx.  Therapy Documentation Precautions:  Precautions Precautions: Fall Precaution Comments: Respiratory Sats Restrictions Weight Bearing Restrictions: No General: Amount of Missed PT Time (min): 35 Minutes Missed Time Reason: Patient on bedrest (Dr. Melvyn Novas present in PT room, advised limited PT.) Vital Signs: Therapy Vitals Temp: 97.4 F (36.3 C) Temp src: Oral Pulse Rate: 99 Resp: 26 BP: 92/51 mmHg Patient Position (if appropriate): Sitting Oxygen Therapy SpO2: 98 % O2 Device: Nasal cannula O2 Flow Rate  (L/min): 2 L/min Pain:  3/10 constant body aching pre/post tx Nursing notified by pt. Family; family states this is currently "normal" due to chemotherapy tx.   See FIM for current functional status  Therapy/Group: Individual Therapy  Juluis Mire 09/16/2013, 9:55 AM

## 2013-09-16 NOTE — Progress Notes (Signed)
Patient Name: Willie [name] Date of Encounter: 09/16/2013     Active Problems:   Physical deconditioning    SUBJECTIVE  Patient was found on EKG to be in atrial flutter. Not on anticoagulation because of gross hematuria. By echo he has normal LV systolic function and moderate MR. Very weak today and Hgb is down to 7.2  CURRENT MEDS . aspirin EC  81 mg Oral QPM  . atorvastatin  20 mg Oral Once per day on Sun Wed  . cholecalciferol  1,000 Units Oral Daily  . vitamin B-12  500 mcg Oral Daily  . docusate sodium  100 mg Oral BID  . metoprolol tartrate  25 mg Oral q morning - 10a  . multivitamin with minerals  1 tablet Oral Daily  . oxymetazoline  1 spray Each Nare BID  . prasugrel  10 mg Oral QPM  . predniSONE  40 mg Oral Q breakfast  . sodium chloride  1 g Oral TID WC  . valACYclovir  500 mg Oral Daily    OBJECTIVE  Filed Vitals:   09/16/13 0002 09/16/13 0403 09/16/13 0758 09/16/13 1024  BP: 100/59 99/61 92/51  125/67  Pulse: 103 103 99 101  Temp: 97.6 F (36.4 C) 97 F (36.1 C) 97.4 F (36.3 C) 97.8 F (36.6 C)  TempSrc: Axillary Axillary Oral Oral  Resp: 20 21 26 22   Weight:  136 lb 12.8 oz (62.052 kg)    SpO2: 100% 100% 98% 100%    Intake/Output Summary (Last 24 hours) at 09/16/13 1252 Last data filed at 09/16/13 4332  Gross per 24 hour  Intake    120 ml  Output   5325 ml  Net  -5205 ml   Filed Weights   09/14/13 0548 09/15/13 0513 09/16/13 0403  Weight: 142 lb 8 oz (64.638 kg) 143 lb 9.6 oz (65.137 kg) 136 lb 12.8 oz (62.052 kg)    PHYSICAL EXAM  General: Pleasant, NAD. Very pale. Weak. Neuro: Alert and oriented X 3. Moves all extremities spontaneously. Psych: Normal affect. HEENT:  Normal  Neck: Supple without bruits or JVD. Lungs:  Rales and decreased breath sounds at bases. Heart: Regular flutter.  Grade 2/6 holosystolic apical murmur. Abdomen: Soft, non-tender, non-distended, BS + x 4.  Extremities: No clubbing, cyanosis or edema.  DP/PT/Radials 2+ and equal bilaterally.  Accessory Clinical Findings  CBC  Recent Labs  09/15/13 0907 09/16/13 0702  WBC 21.4* 16.6*  HGB 8.2* 7.2*  HCT 23.0* 20.2*  MCV 100.9* 103.1*  PLT 212 951   Basic Metabolic Panel  Recent Labs  09/15/13 0907 09/16/13 0702  NA 123* 131*  K 5.3 3.8  CL 86* 91*  CO2 23 30  GLUCOSE 146* 84  BUN 62* 58*  CREATININE 2.36* 2.08*  CALCIUM 10.1 9.2   Liver Function Tests No results found for this basename: AST, ALT, ALKPHOS, BILITOT, PROT, ALBUMIN,  in the last 72 hours No results found for this basename: LIPASE, AMYLASE,  in the last 72 hours Cardiac Enzymes  Recent Labs  09/15/13 0907 09/15/13 1428  CKTOTAL 52  --   CKMB 4.8*  --   TROPONINI  --  <0.30   BNP No components found with this basename: POCBNP,  D-Dimer No results found for this basename: DDIMER,  in the last 72 hours Hemoglobin A1C No results found for this basename: HGBA1C,  in the last 72 hours Fasting Lipid Panel No results found for this basename: CHOL, HDL, LDLCALC, TRIG, CHOLHDL, LDLDIRECT,  in the last 72 hours Thyroid Function Tests No results found for this basename: TSH, T4TOTAL, FREET3, T3FREE, THYROIDAB,  in the last 72 hours  TELE  Not on telemetry  ECG    Radiology/Studies  Dg Chest 2 View  09/09/2013   CLINICAL DATA:  Increased sputum production  EXAM: CHEST  2 VIEW  COMPARISON:  09/07/2013  FINDINGS: Pneumomediastinum and neck base subcutaneous emphysema is stable. No convincing pneumothorax.  Irregular interstitial and airspace densities in the perihilar and lower lungs is stable. No pleural effusion.  Cardiac silhouette is normal in size. Changes from cardiac surgery are stable.  IMPRESSION: 1. No change from the prior study. Persistent pneumomediastinum and neck base subcutaneous emphysema. Persistent ill-defined interstitial and airspace opacities consistent with the focal pneumonia.   Electronically Signed   By: Amie Portland M.D.    On: 09/09/2013 16:54   Dg Chest 2 View  09/06/2013   CLINICAL DATA:  Right-sided pneumothorax  EXAM: CHEST  2 VIEW  COMPARISON:  Portable chest of Sep 05, 2013. And barium esophagram of the same day  FINDINGS: No pneumothorax visible. Small amounts of mediastinal air the in right perihilar region and in bilateral upper paratracheal region similar to prior. No pleural effusion. Minimal prominence of pulmonary interstitium. Cardiac silhouette and pulmonary vascularity within limits of normal.  IMPRESSION: 1. No visible right pneumothorax.  Pneumomediastinum persists. 2. Mild stable pulmonary interstitial edema.   Electronically Signed   By: David  [place]   On: 09/06/2013 09:41   Dg Chest 2 View  09/04/2013   CLINICAL DATA:  Shortness of breath.  EXAM: CHEST  2 VIEW  COMPARISON:  June 17, 2013.  FINDINGS: Stable cardiomediastinal silhouette. Status post coronary artery bypass graft. No pneumothorax or significant pleural effusion is noted. Interval development of bilateral perihilar and basilar interstitial densities are noted concerning for pulmonary edema.  IMPRESSION: Interval development of bilateral perihilar and basilar interstitial densities concerning for pulmonary edema or less likely pneumonia.   Electronically Signed   By: Roque Lias M.D.   On: 09/04/2013 16:01   Ct Angio Chest Pe W/cm &/or Wo Cm  09/04/2013   CLINICAL DATA:  Shortness of breath, tachycardia, hypoxia. History of DVT.  EXAM: CT ANGIOGRAPHY CHEST WITH CONTRAST  TECHNIQUE: Multidetector CT imaging of the chest was performed using the standard protocol during bolus administration of intravenous contrast. Multiplanar CT image reconstructions and MIPs were obtained to evaluate the vascular anatomy.  CONTRAST:  OMNIPAQUE IOHEXOL 350 MG/ML SOLN  COMPARISON:  05/12/2013  FINDINGS: THORACIC INLET/BODY WALL:  Emphysema.  MEDIASTINUM:  Large volume of pneumomediastinum, extending into the neck.  No cardiomegaly. No pericardial  effusion. Extensive atherosclerosis, including the coronary arteries. Status post CABG with LIMA and upper venous graft widely patent. The graft associated with the lower ostium marker is not clearly visible, presumably chronic given history. There is occasional respiratory motion, which decreases sensitivity at the affected levels. Overall, exam is diagnostic. There is no evidence of acute pulmonary embolism. No evidence of esophageal thickening. No fluid collection. No adenopathy.  LUNG WINDOWS:  Small right apical pneumothorax, less than 5%.  There is dependent ground-glass and fine interstitial opacities. Changes are similar to CT 08/25/2013. No pleural effusion or interlobular septal thickening.  UPPER ABDOMEN:  Presumed cyst in the upper right liver, stable from previous imaging.  OSSEOUS:  No acute fracture.  No suspicious lytic or blastic lesions.  Critical Value/emergent results were called by telephone at the time of interpretation on  09/04/2013 at 5:34 PM to Dr. Jinny Blossom, [facility] , who verbally acknowledged these results.  Review of the MIP images confirms the above findings.  IMPRESSION: 1. Pneumomediastinum and trace right apical pneumothorax (less than 5%). This is likely from pulmonic air leak given #2. 2. Unchanged ground-glass opacities in the lower lungs, favoring an inflammatory (including drug reaction) pneumonitis or atypical infection. Given immunocompromised state (in the setting of lymphoma), consider PCP pneumonia - which can predispose to pneumothorax). 3. Negative for acute pulmonary embolism.   Electronically Signed   By: Jorje Guild M.D.   On: 09/04/2013 17:41   Nm Pet Image Restag (ps) Skull Base To Thigh  08/25/2013   CLINICAL DATA:  Subsequent treatment strategy for lymphoma.  EXAM: NUCLEAR MEDICINE PET SKULL BASE TO THIGH  TECHNIQUE: 7.3 mCi F-18 FDG was injected intravenously. Full-ring PET imaging was performed from the skull base to thigh after the radiotracer. CT data was  obtained and used for attenuation correction and anatomic localization.  FASTING BLOOD GLUCOSE:  Value: 96 mg/dl  COMPARISON:  PET-CT scan 06/21/2013  FINDINGS: NECK  No hypermetabolic lymph nodes in the neck.  CHEST  There is new hypermetabolic activity associated with ground-glass opacities primarily in the lower lobes but also scattered within the upper lobes. No mass lesion associated with the these ground-glass hypermetabolic metastases.  The hypermetabolic activity extends into the inferior pleural surfaces.  There several small nodules in the upper lobes. For example 4 mm nodule (image 64 or, series 4)in the right upper lobe. The small nodules are unchanged from prior.  ABDOMEN/PELVIS  No abnormal metabolic activity within the liver or spleen. Spleen is normal volume. Interval resolution of the hypermetabolic activity within the right inguinal lymph nodes. The lymph nodes have hypermetabolic activity less than mediastinum. No measurable enlarged lymph nodes remain.  SKELETON  No focal hypermetabolic activity to suggest skeletal metastasis.  IMPRESSION: 1. Interval resolution of hypermetabolic right inguinal lymph nodes ( [place] 1). 2. New extensive hypermetabolic ground-glass opacities predominantly in the lower lobes. Burtis Junes this represent to be a drug reaction or other inflammatory or infectious process. Do not favor lymphoma recurrence.  Findings conveyed tocancer center triage nurse, [place] 08/25/2013 at11:17.   Electronically Signed   By: Suzy Bouchard M.D.   On: 08/25/2013 11:20   Dg Chest Port 1 View  09/15/2013   CLINICAL DATA:  Congestive heart failure.  EXAM: PORTABLE CHEST - 1 VIEW  COMPARISON:  09/09/2013 and previous  FINDINGS: There has been previous median sternotomy and CABG. The heart is enlarged. There is interstitial and early alveolar edema consistent with acute congestive heart failure. This has worsened since 09/09/2013. No detectable effusion.  IMPRESSION: Interstitial and  alveolar edema consistent with acute congestive heart failure, worsened since 05/23.   Electronically Signed   By: Nelson Chimes M.D.   On: 09/15/2013 12:29   Dg Chest Port 1 View  09/07/2013   CLINICAL DATA:  Short of breath  EXAM: PORTABLE CHEST - 1 VIEW  COMPARISON:  09/06/2013  FINDINGS: Progression of pneumomediastinum, increased subcutaneous gas in the neck.  Negative for pneumothorax  Bibasilar airspace disease unchanged from the prior study. No effusion.  IMPRESSION: Progression of pneumomediastinum and gas in the neck. No pneumothorax.  Bibasilar airspace disease unchanged.   Electronically Signed   By: Franchot Gallo M.D.   On: 09/07/2013 16:27   Dg Chest Port 1 View  09/05/2013   CLINICAL DATA:  Pneumothorax, short of breath, nausea  EXAM: PORTABLE CHEST -  1 VIEW  COMPARISON:  Prior chest x-ray and CT chest 09/04/2013  FINDINGS: Stable trace right apical pneumothorax. The mediastinal air is again a present and tracks into the soft tissues of the right greater than left neck. No significant interval exacerbation. Stable cardiac and mediastinal contours. Patient is status post median sternotomy with evidence of prior multivessel CABG including LIMA bypass. Similar to slightly increased left greater than right bibasilar patchy airspace opacities. No pleural effusion. No acute osseous abnormality.  IMPRESSION: 1. Stable pneumomediastinum in trace right apical pneumothorax. 2. Similar to slightly increased left greater than right bibasilar opacities. Differential considerations remain unchanged including pneumonitis and atypical infection.   Electronically Signed   By: Jacqulynn Cadet M.D.   On: 09/05/2013 08:18   Dg Esophagus W/water Sol Cm  09/05/2013   CLINICAL DATA:  Evaluate for esophageal rupture  EXAM: ESOPHOGRAM/BARIUM SWALLOW  TECHNIQUE: Single contrast examination was performed using water-soluble contrast.  FLUOROSCOPY TIME:  55 seconds  50 cc Omnipaque 300  COMPARISON:  09/05/2013   FINDINGS: The esophagus is patent. No stricture or mass identified. No abnormal extraluminal extravasation of contrast identified to suggest esophageal leak/ rupture.  IMPRESSION: Negative exam.   Electronically Signed   By: Kerby Moors M.D.   On: 09/05/2013 15:26    ASSESSMENT AND PLAN 1. Atrial flutter with controlled ventricular response. 2. Acute on chronic diastolic heart failure exacerbated by anemia 3. CAD 4. Moderate mitral regurgitation. 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 Would give additional lasix 40 mg between units. Daily EKG to follow atrial flutter.  Signed, Darlin Coco MD

## 2013-09-16 NOTE — Progress Notes (Signed)
Physical Therapy Session Note  Patient Details  Name: Willie [name] MRN: [id number] Date of Birth: [birth year]  Today's Date: 09/16/2013  Pt. Supine in bed with family member present (AMY, daughter).  Daughter reports Dr. Mare Ferrari, cardiothorasic, visited and is ordering 2 units blood, nephrology consult for Monday, and is hearing atrial flutter and mitral valve irregularity per daughter. Pt. declined additional tx from physical and occupational therapies.  Repositioned pt. towards the head of the bed to alleviate pt's feet from resting against footboard per daughters request  Pt. Supine in bed with family member present with 3 bedrails up and bed alarm armed. All needs within reach, and coached for nurse call bell.  Therapy/Group: Individual Therapy  Willie [name] 09/16/2013, 2:37 PM

## 2013-09-16 NOTE — Consult Note (Signed)
Name: Willie [name] MRN: [id number] DOB: [birth year]    ADMISSION DATE:  09/12/2013 CONSULTATION DATE:  09/04/2013  REFERRING MD :  Sloan Leiter PRIMARY SERVICE:  Internal  CHIEF COMPLAINT:  PTX, Pneumomediastinum, GGO's.  BRIEF PATIENT DESCRIPTION: 73 y.o. w PMH of NHL, on chemo, presented 5/18 with severe SOB.  In ED, found to have small 5% right PTX, pneumomediastinum, dependent GGO's.  PCCM was consulted, conservative management recommended. Patient now on inpatient rehabilitation. Pulmonary consult called again for respiratory distress and hypoxemia. Chest X ray compatible with pulmonary edema. Patient improved with diuresis. 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 Developed respiratory distress and hypoxemia. Improved with diuresis.   LINES / TUBES: PIV  CULTURES: Blood 5/18 >>> No growth  ANTIBIOTICS: Off antibiotics Valtrex 5/19 >>>   HISTORY OF PRESENT ILLNESS:  Willie [name] is a 73 y.o. With PMH including but not limited to ALk pos NHL (had completed CHOP chemo x 6 rounds, stem cell transplant, BCNU, etoposide, cytoxan, began brentuximab 06/28/2013 for inguinal recurrence). PEt scan showed new GGO s/o infection, started on oral abx & prednisone by onc Alvy Bimler) He was in his [place] until 5/18 when he developed severe SOB mainly with exertion prompting him to go to the ED.  He has had mild dry cough as well as some lower extremity edema.  No fever/chills/sweats, chest pain, N/V/D.  In ED, CTA was negative for PE; however, did reveal large volume pneumomediastinum extending into the neck, a small right apical PTX (< 5%), and  dependent GGO's and fine interstitial opacities that were similar to PET scan done on 08/25/13.  Findings were concerning for atypical PNA vs PCP given his immunocompromised state. Pneumothorax was managed conservatively. Finished course of broad spectrum antibiotics with significant improvement. Was transferred to inpatient rehab. Today he developed respiratory distress and hypoxemia. Chest X ray compatible with worsening pulmonary edema. Cardiology was consulted and recommended diuresis. The patient showed significant improvement with IV diuresis and now is resting comfortably, saturating 100% on 2L [place].  PAST MEDICAL HISTORY :  Past Medical History  Diagnosis Date  . Hypertension   . History of DVT of lower extremity     06/2001 as first sign of lymphoma: enlarged left inguinal nodes  . Low serum testosterone level 11/10/2011  . 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 Nocturia 11/10/2011  . Normochromic anemia 11/10/2011    Likely result of prior high dose chemotherapy  . Unexplained weight loss 05/03/2012  . Mitral valvular regurgitation 06/01/12    moderate to severe by echo- asymptomatic  . Dyslipidemia   . RBBB   . Coronary artery disease   . Family history of anesthesia complication     "son, Margreta Journey, larynx collapses" (10/11/2012)  . Heart murmur   . History of blood transfusion 2009-2010    "both related to his chemo" (10/11/2012)  . Chronic renal insufficiency, stage III (moderate)   . Non Hodgkin's lymphoma 2003; 2009    "chemo; stem cell transplant & high powered chemo" (10/10/2012)  . Renal insufficiency   . Deep inguinal pain, right 05/26/2013  . Conjunctivitis, acute, right eye 06/28/2013  Past Surgical History  Procedure Laterality Date  . Bone marrow transplant  2010  . Cardiac catheterization  10/03/2012; 2007  . Coronary angioplasty with stent placement  10/11/2012    "2" (10/11/2012)  . Tonsillectomy  1950's  . Cataract extraction w/ intraocular lens implant Left  1990's  . Coronary artery bypass graft  03/29/2006    "CABG X5" (10/11/2012)   Prior to Admission medications   Medication Sig Start Date End Date Taking? Authorizing Provider  amoxicillin-clavulanate (AUGMENTIN) 875-125 MG per tablet Take 1 tablet by mouth 2 (two) times daily. 09/01/13  Yes Heath Lark, MD  aspirin EC 81 MG tablet Take 81 mg by mouth every evening.   Yes Historical Provider, MD  Calcium Carbonate-Vitamin D (CALCIUM 500 + D PO) Take 1 tablet by mouth daily.    Yes Historical Provider, MD  cholecalciferol (VITAMIN D) 1000 UNITS tablet Take 1,000 Units by mouth daily.    Yes Historical Provider, MD  dexamethasone (DECADRON) 4 MG tablet Take 8 mg by mouth daily. Take 2 tablets twice daily for 2 days after chemotherapy treatment   Yes Historical Provider, MD  fish oil-omega-3 fatty acids 1000 MG capsule Take 1 g by mouth daily.    Yes Historical Provider, MD  metoprolol tartrate (LOPRESSOR) 25 MG tablet Take 25 mg by mouth every morning. verified that it is not XL   Yes Historical Provider, MD  Multiple Vitamin (MULTIVITAMIN WITH MINERALS) TABS Take 1 tablet by mouth daily.   Yes Historical Provider, MD  Potassium 99 MG TABS Take 1 tablet by mouth daily.   Yes Historical Provider, MD  prasugrel (EFFIENT) 10 MG TABS tablet Take 10 mg by mouth every evening.   Yes Historical Provider, MD  predniSONE (DELTASONE) 20 MG tablet Take 3 tablets (60 mg total) by mouth daily with breakfast. 08/30/13  Yes Heath Lark, MD  rosuvastatin (CRESTOR) 10 MG tablet Take 10 mg by mouth 2 (two) times a week. On Wednesday and Sunday   Yes Historical Provider, MD  testosterone cypionate (DEPOTESTOTERONE CYPIONATE) 200 MG/ML injection Inject into the muscle every 14 (fourteen) days. 07/25/13  Yes Historical Provider, MD  valACYclovir (VALTREX) 1000 MG tablet Take 500-1,000 mg by mouth daily. Take 2 tablets (1000 mg) twice daily for 7 days then 500 mg daily.   Yes Historical Provider, MD  vitamin B-12  (CYANOCOBALAMIN) 500 MCG tablet Take 500 mcg by mouth daily.   Yes Historical Provider, MD  nitroGLYCERIN (NITROSTAT) 0.4 MG SL tablet Place 1 tablet (0.4 mg total) under the tongue every 5 (five) minutes as needed for chest pain. 10/04/12   Brittainy Simmons, PA-C  ondansetron (ZOFRAN ODT) 8 MG disintegrating tablet Take 1 by mouth twice daily for 2 days to start day after chemotherapy treatment then 1 by mouth every 8 hours as needed for nausea or vomiting 06/27/13   Annia Belt, MD   Allergies  Allergen Reactions  . Niaspan [Niacin Er] Rash    FAMILY HISTORY:  Family History  Problem Relation Age of Onset  . Heart failure Father   . Leukemia Brother 21  . Cancer - Lung Mother    SOCIAL HISTORY:  reports that he quit smoking about 44 years ago. His smoking use included Cigarettes. He has a 6.5 pack-year smoking history. He has never used smokeless tobacco. He reports that he does not drink alcohol or use illicit drugs.  REVIEW OF SYSTEMS:   All systems reviewed and found negative except for what I mentioned in  the HPI.   SUBJECTIVE: Breathing improved since symptom onset.  No chest pain, wheeze, cough, fevers/chills/sweats.  VITAL SIGNS: Temp:  [97 F (36.1 C)-97.6 F (36.4 C)] 97 F (36.1 C) (05/30 0403) Pulse Rate:  [83-121] 103 (05/30 0403) Resp:  [18-24] 21 (05/30 0403) BP: (99-155)/(50-77) 99/61 mmHg (05/30 0403) SpO2:  [88 %-100 %] 100 % (05/30 0403) Weight:  [143 lb 9.6 oz (65.137 kg)] 143 lb 9.6 oz (65.137 kg) (05/29 0513)  PHYSICAL EXAMINATION: General: Pleasant male, resting in bed, in NAD. Neuro: A&O x 3, non-focal.  HEENT: [place]/AT. PERRL, sclerae anicteric. Cardiovascular: RRR, 3/6 PSM.at apex Lungs: Respirations even and unlabored.  CTA bilaterally, No W/R/R.  [place] in place. Abdomen: BS x 4, soft, NT/ND.  Musculoskeletal: No gross deformities, no edema.  Skin: Intact, warm, no rashes.     Recent Labs Lab 09/12/13 0856 09/12/13 1930 09/14/13 0700  09/15/13 0907  NA 124*  --  121* 123*  K 4.8  --  5.4* 5.3  CL 87*  --  85* 86*  CO2 25  --  25 23  BUN 44*  --  57* 62*  CREATININE 1.70* 2.05* 2.51* 2.36*  GLUCOSE 109*  --  101* 146*    Recent Labs Lab 09/11/13 0506 09/12/13 1930 09/15/13 0907  HGB 9.5* 9.0* 8.2*  HCT 26.2* 25.3* 23.0*  WBC 20.6* 16.6* 21.4*  PLT 208 220 212   Dg Chest Port 1 View  09/15/2013   CLINICAL DATA:  Congestive heart failure.  EXAM: PORTABLE CHEST - 1 VIEW  COMPARISON:  09/09/2013 and previous  FINDINGS: There has been previous median sternotomy and CABG. The heart is enlarged. There is interstitial and early alveolar edema consistent with acute congestive heart failure. This has worsened since 09/09/2013. No detectable effusion.  IMPRESSION: Interstitial and alveolar edema consistent with acute congestive heart failure, worsened since 05/23.   Electronically Signed   By: Nelson Chimes M.D.   On: 09/15/2013 12:29    ASSESSMENT: Acute hypoxemia and respiratory distress - likely secondary to pulmonary edema, good response to diuresis. Now saturating 100% on 2 l [place] and resting comfortably. Paroxysmal A. Flutter - Rate controlled. No anticoagulation due to hematuria.  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 Lit review does show a 1-10% incidence of pneumonitis with brentuximab (anti CD 30 agent) Small right apical PTX ,Pneumomediastinum -cause of this is unclear resolved Hematuria - improving CKD - Creatinine trending down today, likely due to diuresis.    RECOMMENDATIONS: - Continue diuresis - Cardiology recommendations appreciated.  - Supplemental O2 as needed, wean as able. - Continue prednisone taper as planned.  Waynetta Pean, MD [facility]

## 2013-09-16 NOTE — Plan of Care (Signed)
Problem: RH BLADDER ELIMINATION Goal: RH STG MANAGE BLADDER WITH ASSISTANCE STG Manage Bladder With Assistance. Mod I  Outcome: Not Progressing Total assist with foley catheter at the moment, due to diuresis.

## 2013-09-17 ENCOUNTER — Inpatient Hospital Stay (HOSPITAL_COMMUNITY): Payer: Medicare Other

## 2013-09-17 ENCOUNTER — Encounter (HOSPITAL_COMMUNITY): Payer: Medicare Other | Admitting: Occupational Therapy

## 2013-09-17 DIAGNOSIS — J9600 Acute respiratory failure, unspecified whether with hypoxia or hypercapnia: Secondary | ICD-10-CM | POA: Diagnosis present

## 2013-09-17 LAB — BASIC METABOLIC PANEL
BUN: 52 mg/dL — AB (ref 6–23)
BUN: 43 mg/dL — ABNORMAL HIGH (ref 6–23)
CHLORIDE: 87 meq/L — AB (ref 96–112)
CO2: 36 mEq/L — ABNORMAL HIGH (ref 19–32)
CO2: 39 meq/L — AB (ref 19–32)
CREATININE: 1.92 mg/dL — AB (ref 0.50–1.35)
Calcium: 9.3 mg/dL (ref 8.4–10.5)
Calcium: 9.1 mg/dL (ref 8.4–10.5)
Chloride: 89 mEq/L — ABNORMAL LOW (ref 96–112)
Creatinine, Ser: 1.86 mg/dL — ABNORMAL HIGH (ref 0.50–1.35)
GFR calc Af Amer: 39 mL/min — ABNORMAL LOW (ref >90)
GFR calc Af Amer: 40 mL/min — ABNORMAL LOW (ref >90)
GFR calc non Af Amer: 33 mL/min — ABNORMAL LOW (ref >90)
GFR, EST NON AFRICAN AMERICAN: 35 mL/min — AB (ref >90)
GLUCOSE: 106 mg/dL — AB (ref 70–99)
Glucose, Bld: 106 mg/dL — ABNORMAL HIGH (ref 70–99)
POTASSIUM: 3.2 meq/L — AB (ref 3.7–5.3)
Potassium: 3 mEq/L — ABNORMAL LOW (ref 3.7–5.3)
Sodium: 134 mEq/L — ABNORMAL LOW (ref 137–147)
Sodium: 135 mEq/L — ABNORMAL LOW (ref 137–147)

## 2013-09-17 LAB — TYPE AND SCREEN
ABO/RH(D): O NEG
Antibody Screen: NEGATIVE
UNIT DIVISION: 0
Unit division: 0

## 2013-09-17 LAB — HEMOGLOBIN AND HEMATOCRIT, BLOOD
HCT: 30.2 % — ABNORMAL LOW (ref 39.0–52.0)
Hemoglobin: 10.4 g/dL — ABNORMAL LOW (ref 13.0–17.0)

## 2013-09-17 MED ORDER — FUROSEMIDE 20 MG PO TABS
20.0000 mg | ORAL_TABLET | Freq: Every day | ORAL | Status: DC
  Administered 2013-09-17 – 2013-09-20 (×4): 20 mg via ORAL
  Filled 2013-09-17 (×6): qty 1

## 2013-09-17 MED ORDER — POTASSIUM CHLORIDE CRYS ER 20 MEQ PO TBCR
30.0000 meq | EXTENDED_RELEASE_TABLET | Freq: Once | ORAL | Status: AC
  Administered 2013-09-17: 30 meq via ORAL
  Filled 2013-09-17: qty 1

## 2013-09-17 MED ORDER — POTASSIUM CHLORIDE CRYS ER 10 MEQ PO TBCR
10.0000 meq | EXTENDED_RELEASE_TABLET | Freq: Every day | ORAL | Status: DC
  Administered 2013-09-18 – 2013-09-20 (×3): 10 meq via ORAL
  Filled 2013-09-17 (×5): qty 1

## 2013-09-17 MED ORDER — MEGESTROL ACETATE 400 MG/10ML PO SUSP
200.0000 mg | Freq: Two times a day (BID) | ORAL | Status: DC
  Administered 2013-09-17 – 2013-09-20 (×7): 200 mg via ORAL
  Filled 2013-09-17 (×10): qty 5

## 2013-09-17 NOTE — Progress Notes (Signed)
Occupational Therapy Session Note  Patient Details  Name: Willie [name] MRN: [id number] Date of Birth: October 04, 1940  Today's Date: 09/17/2013 Time: 6144-3154 Time Calculation (min): 45 min  Short Term Goals: Week 1:  OT Short Term Goal 1 (Week 1): STGs = LTGs due to short LOS  Skilled Therapeutic Interventions/Progress Updates:  Patient with no complaints of pain Per written order in chart, "May do bedside therapy today..max HR120".  Patient received supine in bed with son and wife present at bedside. Son and wife eager to engage patient in bathing & dressing tasks while supine in bed. Patient willing, but fatigued. Focused skilled intervention on bed mobility, UB/LB bathing, donning of clean gown, education to wife & son regarding re-positioning in bed secondary to redness around sacrum, and overall activity tolerance/endurance. HR and 02 checked throughout session, HR=86-88 and 02 =94-99% on 2 liters of 02 via [place]; RN notified of sat readings. At end of session, re-positioned patient > right side with pillows and left all needs within reach. Wife and son at bedside.   Precautions:  Precautions Precautions: Fall Precaution Comments: Respiratory Sats Restrictions Weight Bearing Restrictions: No  See FIM for current functional status  Therapy/Group: Individual Therapy  Estelle June 09/17/2013, 12:01 PM

## 2013-09-17 NOTE — Progress Notes (Signed)
After Lasix given, patient's foley bag emptied of 350 ml maroon urine. Bladder scan performed for 621 ml urine.  Foley line manipulated.  600 ml amber urine removed from foley bag.  Patient tolerated well.

## 2013-09-17 NOTE — Progress Notes (Signed)
Patient Name: Willie [name] Date of Encounter: 09/17/2013     Active Problems:   Physical deconditioning    SUBJECTIVE  Patient tolerated the blood transfusions yesterday. He feels better today. No chest pain. Less dyspnea. EKG yesterday afternoon showed that he was back in sinus thythm.  CURRENT MEDS . aspirin EC  81 mg Oral QPM  . atorvastatin  20 mg Oral Once per day on Sun Wed  . cholecalciferol  1,000 Units Oral Daily  . vitamin B-12  500 mcg Oral Daily  . docusate sodium  100 mg Oral BID  . megestrol  200 mg Oral BID  . metoprolol tartrate  25 mg Oral q morning - 10a  . multivitamin with minerals  1 tablet Oral Daily  . oxymetazoline  1 spray Each Nare BID  . [START ON 09/18/2013] potassium chloride  10 mEq Oral Daily  . prasugrel  10 mg Oral QPM  . predniSONE  40 mg Oral Q breakfast  . sodium chloride  1 g Oral TID WC  . valACYclovir  500 mg Oral Daily    OBJECTIVE  Filed Vitals:   09/16/13 2358 09/17/13 0404 09/17/13 0741 09/17/13 1147  BP: 126/62 127/76 108/63 93/60  Pulse: 101 112 99 84  Temp: 98 F (36.7 C) 97.8 F (36.6 C) 97.8 F (36.6 C) 97.6 F (36.4 C)  TempSrc: Oral Oral Oral Oral  Resp: 20 21 20 22   Weight:  129 lb 6.4 oz (58.695 kg)    SpO2: 100% 100% 97% 100%    Intake/Output Summary (Last 24 hours) at 09/17/13 1232 Last data filed at 09/17/13 1000  Gross per 24 hour  Intake 1027.5 ml  Output   4800 ml  Net -3772.5 ml   Filed Weights   09/15/13 0513 09/16/13 0403 09/17/13 0404  Weight: 143 lb 9.6 oz (65.137 kg) 136 lb 12.8 oz (62.052 kg) 129 lb 6.4 oz (58.695 kg)    PHYSICAL EXAM General: Pleasant, NAD. Color has improved. Neuro: Alert and oriented X 3. Moves all extremities spontaneously.  Psych: Normal affect.  HEENT: Normal  Neck: Supple without bruits or JVD.  Lungs: Rales and decreased breath sounds at bases.  Heart: Slightly irregular.  Grade 2/6 holosystolic apical murmur.  Abdomen: Soft, non-tender, non-distended,  BS + x 4.  Extremities: Mild edema. DP/PT/Radials 2+ and equal bilaterally.   Accessory Clinical Findings  CBC  Recent Labs  09/15/13 0907 09/16/13 0702 09/17/13 0114  WBC 21.4* 16.6*  --   HGB 8.2* 7.2* 10.4*  HCT 23.0* 20.2* 30.2*  MCV 100.9* 103.1*  --   PLT 212 170  --    Basic Metabolic Panel  Recent Labs  09/16/13 0702 09/17/13 0114  NA 131* 134*  K 3.8 3.0*  CL 91* 87*  CO2 30 36*  GLUCOSE 84 106*  BUN 58* 52*  CREATININE 2.08* 1.92*  CALCIUM 9.2 9.3   Liver Function Tests No results found for this basename: AST, ALT, ALKPHOS, BILITOT, PROT, ALBUMIN,  in the last 72 hours No results found for this basename: LIPASE, AMYLASE,  in the last 72 hours Cardiac Enzymes  Recent Labs  09/15/13 0907 09/15/13 1428  CKTOTAL 52  --   CKMB 4.8*  --   TROPONINI  --  <0.30   BNP No components found with this basename: POCBNP,  D-Dimer No results found for this basename: DDIMER,  in the last 72 hours Hemoglobin A1C No results found for this basename: HGBA1C,  in the last  72 hours Fasting Lipid Panel No results found for this basename: CHOL, HDL, LDLCALC, TRIG, CHOLHDL, LDLDIRECT,  in the last 72 hours Thyroid Function Tests No results found for this basename: TSH, T4TOTAL, FREET3, T3FREE, THYROIDAB,  in the last 72 hours  TELE  Not on telemetry  ECG  Pending today.  Radiology/Studies  Dg Chest 2 View  09/09/2013   CLINICAL DATA:  Increased sputum production  EXAM: CHEST  2 VIEW  COMPARISON:  09/07/2013  FINDINGS: Pneumomediastinum and neck base subcutaneous emphysema is stable. No convincing pneumothorax.  Irregular interstitial and airspace densities in the perihilar and lower lungs is stable. No pleural effusion.  Cardiac silhouette is normal in size. Changes from cardiac surgery are stable.  IMPRESSION: 1. No change from the prior study. Persistent pneumomediastinum and neck base subcutaneous emphysema. Persistent ill-defined interstitial and airspace  opacities consistent with the focal pneumonia.   Electronically Signed   By: Lajean Manes M.D.   On: 09/09/2013 16:54   Dg Chest 2 View  09/06/2013   CLINICAL DATA:  Right-sided pneumothorax  EXAM: CHEST  2 VIEW  COMPARISON:  Portable chest of Sep 05, 2013. And barium esophagram of the same day  FINDINGS: No pneumothorax visible. Small amounts of mediastinal air the in right perihilar region and in bilateral upper paratracheal region similar to prior. No pleural effusion. Minimal prominence of pulmonary interstitium. Cardiac silhouette and pulmonary vascularity within limits of normal.  IMPRESSION: 1. No visible right pneumothorax.  Pneumomediastinum persists. 2. Mild stable pulmonary interstitial edema.   Electronically Signed   By: David  Martinique   On: 09/06/2013 09:41   Dg Chest 2 View  09/04/2013   CLINICAL DATA:  Shortness of breath.  EXAM: CHEST  2 VIEW  COMPARISON:  June 17, 2013.  FINDINGS: Stable cardiomediastinal silhouette. Status post coronary artery bypass graft. No pneumothorax or significant pleural effusion is noted. Interval development of bilateral perihilar and basilar interstitial densities are noted concerning for pulmonary edema.  IMPRESSION: Interval development of bilateral perihilar and basilar interstitial densities concerning for pulmonary edema or less likely pneumonia.   Electronically Signed   By: Sabino Dick M.D.   On: 09/04/2013 16:01   Ct Angio Chest Pe W/cm &/or Wo Cm  09/04/2013   CLINICAL DATA:  Shortness of breath, tachycardia, hypoxia. History of DVT.  EXAM: CT ANGIOGRAPHY CHEST WITH CONTRAST  TECHNIQUE: Multidetector CT imaging of the chest was performed using the standard protocol during bolus administration of intravenous contrast. Multiplanar CT image reconstructions and MIPs were obtained to evaluate the vascular anatomy.  CONTRAST:  126mL OMNIPAQUE IOHEXOL 350 MG/ML SOLN  COMPARISON:  05/12/2013  FINDINGS: THORACIC INLET/BODY WALL:  Emphysema.  MEDIASTINUM:   Large volume of pneumomediastinum, extending into the neck.  No cardiomegaly. No pericardial effusion. Extensive atherosclerosis, including the coronary arteries. Status post CABG with LIMA and upper venous graft widely patent. The graft associated with the lower ostium marker is not clearly visible, presumably chronic given history. There is occasional respiratory motion, which decreases sensitivity at the affected levels. Overall, exam is diagnostic. There is no evidence of acute pulmonary embolism. No evidence of esophageal thickening. No fluid collection. No adenopathy.  LUNG WINDOWS:  Small right apical pneumothorax, less than 5%.  There is dependent ground-glass and fine interstitial opacities. Changes are similar to CT 08/25/2013. No pleural effusion or interlobular septal thickening.  UPPER ABDOMEN:  Presumed cyst in the upper right liver, stable from previous imaging.  OSSEOUS:  No acute fracture.  No  suspicious lytic or blastic lesions.  Critical Value/emergent results were called by telephone at the time of interpretation on 09/04/2013 at 5:34 PM to Dr. Jinny Blossom, [facility] , who verbally acknowledged these results.  Review of the MIP images confirms the above findings.  IMPRESSION: 1. Pneumomediastinum and trace right apical pneumothorax (less than 5%). This is likely from pulmonic air leak given #2. 2. Unchanged ground-glass opacities in the lower lungs, favoring an inflammatory (including drug reaction) pneumonitis or atypical infection. Given immunocompromised state (in the setting of lymphoma), consider PCP pneumonia - which can predispose to pneumothorax). 3. Negative for acute pulmonary embolism.   Electronically Signed   By: Jorje Guild M.D.   On: 09/04/2013 17:41   Nm Pet Image Restag (ps) Skull Base To Thigh  08/25/2013   CLINICAL DATA:  Subsequent treatment strategy for lymphoma.  EXAM: NUCLEAR MEDICINE PET SKULL BASE TO THIGH  TECHNIQUE: 7.3 mCi F-18 FDG was injected intravenously. Full-ring  PET imaging was performed from the skull base to thigh after the radiotracer. CT data was obtained and used for attenuation correction and anatomic localization.  FASTING BLOOD GLUCOSE:  Value: 96 mg/dl  COMPARISON:  PET-CT scan 06/21/2013  FINDINGS: NECK  No hypermetabolic lymph nodes in the neck.  CHEST  There is new hypermetabolic activity associated with ground-glass opacities primarily in the lower lobes but also scattered within the upper lobes. No mass lesion associated with the these ground-glass hypermetabolic metastases.  The hypermetabolic activity extends into the inferior pleural surfaces.  There several small nodules in the upper lobes. For example 4 mm nodule (image 64 or, series 4)in the right upper lobe. The small nodules are unchanged from prior.  ABDOMEN/PELVIS  No abnormal metabolic activity within the liver or spleen. Spleen is normal volume. Interval resolution of the hypermetabolic activity within the right inguinal lymph nodes. The lymph nodes have hypermetabolic activity less than mediastinum. No measurable enlarged lymph nodes remain.  SKELETON  No focal hypermetabolic activity to suggest skeletal metastasis.  IMPRESSION: 1. Interval resolution of hypermetabolic right inguinal lymph nodes ( [place] 1). 2. New extensive hypermetabolic ground-glass opacities predominantly in the lower lobes. Burtis Junes this represent to be a drug reaction or other inflammatory or infectious process. Do not favor lymphoma recurrence.  Findings conveyed tocancer center triage nurse, [place] 08/25/2013 at11:17.   Electronically Signed   By: Suzy Bouchard M.D.   On: 08/25/2013 11:20   Dg Chest Port 1 View  09/15/2013   CLINICAL DATA:  Congestive heart failure.  EXAM: PORTABLE CHEST - 1 VIEW  COMPARISON:  09/09/2013 and previous  FINDINGS: There has been previous median sternotomy and CABG. The heart is enlarged. There is interstitial and early alveolar edema consistent with acute congestive heart failure. This  has worsened since 09/09/2013. No detectable effusion.  IMPRESSION: Interstitial and alveolar edema consistent with acute congestive heart failure, worsened since 05/23.   Electronically Signed   By: Nelson Chimes M.D.   On: 09/15/2013 12:29   Dg Chest Port 1 View  09/07/2013   CLINICAL DATA:  Short of breath  EXAM: PORTABLE CHEST - 1 VIEW  COMPARISON:  09/06/2013  FINDINGS: Progression of pneumomediastinum, increased subcutaneous gas in the neck.  Negative for pneumothorax  Bibasilar airspace disease unchanged from the prior study. No effusion.  IMPRESSION: Progression of pneumomediastinum and gas in the neck. No pneumothorax.  Bibasilar airspace disease unchanged.   Electronically Signed   By: Franchot Gallo M.D.   On: 09/07/2013 16:27   Dg Chest  Port 1 View  09/05/2013   CLINICAL DATA:  Pneumothorax, short of breath, nausea  EXAM: PORTABLE CHEST - 1 VIEW  COMPARISON:  Prior chest x-ray and CT chest 09/04/2013  FINDINGS: Stable trace right apical pneumothorax. The mediastinal air is again a present and tracks into the soft tissues of the right greater than left neck. No significant interval exacerbation. Stable cardiac and mediastinal contours. Patient is status post median sternotomy with evidence of prior multivessel CABG including LIMA bypass. Similar to slightly increased left greater than right bibasilar patchy airspace opacities. No pleural effusion. No acute osseous abnormality.  IMPRESSION: 1. Stable pneumomediastinum in trace right apical pneumothorax. 2. Similar to slightly increased left greater than right bibasilar opacities. Differential considerations remain unchanged including pneumonitis and atypical infection.   Electronically Signed   By: Jacqulynn Cadet M.D.   On: 09/05/2013 08:18   Dg Esophagus W/water Sol Cm  09/05/2013   CLINICAL DATA:  Evaluate for esophageal rupture  EXAM: ESOPHOGRAM/BARIUM SWALLOW  TECHNIQUE: Single contrast examination was performed using water-soluble contrast.   FLUOROSCOPY TIME:  55 seconds  50 cc Omnipaque 300  COMPARISON:  09/05/2013  FINDINGS: The esophagus is patent. No stricture or mass identified. No abnormal extraluminal extravasation of contrast identified to suggest esophageal leak/ rupture.  IMPRESSION: Negative exam.   Electronically Signed   By: Kerby Moors M.D.   On: 09/05/2013 15:26    ASSESSMENT AND PLAN 1. Paroxysmal atrial flutter. 2. Acute on chronic diastolic heart failure exacerbated by anemia  3. CAD  4. Moderate mitral regurgitation.  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 Anemia improved after transfusion yesterday.  Chest xray and EKG pending today. Will continue low dose lasix 20 mg daily.  Signed, Darlin Coco MD

## 2013-09-17 NOTE — Progress Notes (Addendum)
[facility] PHYSICAL MEDICINE & REHABILITATION     PROGRESS NOTE    Subjective/Complaints: Feels a little better  Denies problems. No sob, cp, no cough, feels weak, Appreciate cardiology Note    review of systems has been performed and if not noted above is otherwise negative.   Objective: Vital Signs: Blood pressure 127/76, pulse 112, temperature 97.8 F (36.6 C), temperature source Oral, resp. rate 21, weight 58.695 kg (129 lb 6.4 oz), SpO2 100.00%. Dg Chest Port 1 View  09/15/2013   CLINICAL DATA:  Congestive heart failure.  EXAM: PORTABLE CHEST - 1 VIEW  COMPARISON:  09/09/2013 and previous  FINDINGS: There has been previous median sternotomy and CABG. The heart is enlarged. There is interstitial and early alveolar edema consistent with acute congestive heart failure. This has worsened since 09/09/2013. No detectable effusion.  IMPRESSION: Interstitial and alveolar edema consistent with acute congestive heart failure, worsened since 05/23.   Electronically Signed   By: Nelson Chimes M.D.   On: 09/15/2013 12:29    Recent Labs  09/15/13 0907 09/16/13 0702 09/17/13 0114  WBC 21.4* 16.6*  --   HGB 8.2* 7.2* 10.4*  HCT 23.0* 20.2* 30.2*  PLT 212 170  --     Recent Labs  09/16/13 0702 09/17/13 0114  NA 131* 134*  K 3.8 3.0*  CL 91* 87*  GLUCOSE 84 106*  BUN 58* 52*  CREATININE 2.08* 1.92*  CALCIUM 9.2 9.3   CBG (last 3)   Recent Labs  09/15/13 1123  GLUCAP 186*    Wt Readings from Last 3 Encounters:  09/17/13 58.695 kg (129 lb 6.4 oz)  09/10/13 60.782 kg (134 lb)  08/30/13 60.51 kg (133 lb 6.4 oz)    Physical Exam:   Constitutional: He is oriented to person, place, and time.  73 year old frail Caucasian male  HENT: mucosa pink/moist, tongue a little dry  Head: Normocephalic.  Eyes: EOM are normal.  Neck: Normal range of motion. Neck supple. No thyromegaly present.  Cardiovascular: Normal rate and regular rhythm. Systolic murmur  Respiratory:   Breathing appears comfortable. Non-labored. No rales GI: Soft. Bowel sounds are normal. He exhibits no distension.  Neurological: He is alert and oriented to person, place, and time.  motor strength is 4+/5 bilateral deltoid, bicep, tricep, grip, hip flexors, knee extensors, ankle dorsiflexion plantar flexion  Sensation is intact to light touch and proprioception bilateral upper and lower limbs  Patient moves with delay, needs repeated cueing at times.  Psych: affect flat, cooperative.     Assessment/Plan: 1. Functional deficits secondary to deconditioning after pneumonia/respiratory failure which require 3+ hours per day of interdisciplinary therapy in a comprehensive inpatient rehab setting. Physiatrist is providing close team supervision and 24 hour management of active medical problems listed below. Physiatrist and rehab team continue to assess barriers to discharge/monitor patient progress toward functional and medical goals. Doing better today, son feels the pt is breathing better than he has in recent days Appreciate cardiology assistance Will get pt up to bedside today Repeat CXR  recheck bmet for hypo-[place]  FIM: FIM - Bathing Bathing Steps Patient Completed: Chest;Right Arm;Left Arm;Abdomen;Front perineal area;Buttocks;Right upper leg;Left upper leg Bathing: 0: Activity did not occur (Pt reports that he physically cannot participate in ADL's, too weak. )  FIM - Upper Body Dressing/Undressing Upper body dressing/undressing steps patient completed: Thread/unthread right sleeve of pullover shirt/dresss;Thread/unthread left sleeve of pullover shirt/dress;Put head through opening of pull over shirt/dress;Pull shirt over trunk Upper body dressing/undressing:  0: Activity did not occur (Pt states he physically cannot participate in ADl's, too weak) FIM - Lower Body Dressing/Undressing Lower body dressing/undressing steps patient completed: Pull underwear up/down;Pull pants  up/down Lower body dressing/undressing: 0: Activity did not occur (Pt unable to participate)  FIM - Toileting Toileting steps completed by patient: Performs perineal hygiene;Adjust clothing prior to toileting Toileting Assistive Devices: Grab bar or rail for support Toileting: 1: Two helpers  FIM - Radio producer Devices: Product manager Transfers: 3-From toilet/BSC: Mod A (lift or lower assist)  FIM - Control and instrumentation engineer Devices: Bed rails;Arm rests Bed/Chair Transfer: 0: Activity did not occur  FIM - Locomotion: Wheelchair Distance: 60 Locomotion: Wheelchair: 0: Activity did not occur FIM - Locomotion: Ambulation Locomotion: Ambulation Assistive Devices: Administrator Ambulation/Gait Assistance: 4: Min guard Locomotion: Ambulation: 0: Activity did not occur  Comprehension Comprehension Mode: Auditory Comprehension: 5-Understands complex 90% of the time/Cues < 10% of the time  Expression Expression Mode: Verbal Expression: 5-Expresses basic needs/ideas: With extra time/assistive device  Social Interaction Social Interaction: 5-Interacts appropriately 90% of the time - Needs monitoring or encouragement for participation or interaction.  Problem Solving Problem Solving: 4-Solves basic 75 - 89% of the time/requires cueing 10 - 24% of the time  Memory Memory: 3-Recognizes or recalls 50 - 74% of the time/requires cueing 25 - 49% of the time Medical Problem List and Plan:  1. Functional deficits secondary to deconditioning after pneumonia, respiratory failure with history of non-Hodgkin's lymphoma  2. DVT Prophylaxis/Anticoagulation: off lovenox, urine looks concentrated some dark blood but no longer "cherry red"  3. Pain Management: Hydrocodone as needed. Monitor with increased mobility  4. Mood: Bouts of confusion likely related to multiple medical issues 5. Neuropsych: This patient is not capable of making  decisions on his own behalf.  6. Non-Hodgkin's lymphoma. Followup per Dr.Gorsuch. 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 Chemotherapy program to be reevaluated as outpatient    7. Hypertension. Lopressor 25 mg daily. Monitor with increased mobility  8. CAD with CABG. No chest pain or shortness of breath. Continue Effient 10 mg daily as well as aspirin  9. Hyperlipidemia. Lipitor  10. Chronic renal insufficiency. Baseline CR around 1.5---has been trending up. May need to run dry to avoid pulmonary edema 11. Hyponatremia resolved 12.  Hematuria improved off of lovenox 13.  Hypokalemia   LOS (Days) 5 A FACE TO FACE EVALUATION WAS PERFORMED  Charlett Blake 09/17/2013 7:00 AM

## 2013-09-17 NOTE — Progress Notes (Signed)
Name: Willie [name] MRN: [id number] DOB: 19-Jan-1941    ADMISSION DATE:  09/12/2013 CONSULTATION DATE:  09/04/2013  REFERRING MD :  Sloan Leiter PRIMARY SERVICE: Rehab svc  CHIEF COMPLAINT:  PTX, Pneumomediastinum, GGO's.  BRIEF PATIENT DESCRIPTION: 73 y.o. w PMH of NHL, on chemo, presented 5/18 with severe SOB.  In ED, found to have small 5% right PTX, pneumomediastinum, dependent GGO's.  PCCM was consulted, conservative management recommended. Patient now on inpatient rehabilitation. Pulmonary consult called again for respiratory distress and hypoxemia. Chest X ray compatible with pulmonary edema. 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 Developed respiratory distress and hypoxemia. Improved with diuresis.   LINES / TUBES: PIV  CULTURES: Blood 5/18 >>> No growth Urine 5/29 > neg  ANTIBIOTICS: Off antibiotics Valtrex 5/19 >>>    SUBJECTIVE: Breathing improved p trx and diuresis   VITAL SIGNS:   Intake/Output Summary (Last 24 hours) at 09/17/13 1311 Last data filed at 09/17/13 1241  Gross per 24 hour  Intake 1007.5 ml  Output   4275 ml  Net -3267.5 ml    Temp:  [97.5 F (36.4 C)-98 F (36.7 C)] 97.6 F (36.4 C) (05/31 1147) Pulse Rate:  [84-113] 84 (05/31 1147) Resp:  [20-28] 22 (05/31 1147) BP: (93-147)/(60-76) 93/60 mmHg (05/31 1147) SpO2:  [97 %-100 %] 100 % (05/31 1147) Weight:  [129 lb 6.4 oz (58.695 kg)] 129 lb 6.4 oz (58.695 kg) (05/31 0404) FIO2 2lpm   PHYSICAL EXAMINATION: General: Pleasant male, resting in bed, in NAD. Neuro: A&O x 3, non-focal.  HEENT: [place]/AT. PERRL, sclerae  anicteric. Cardiovascular: RRR, 3/6 PSM.at apex Lungs: Respirations even and unlabored.  CTA bilaterally, No W/R/R.  [place] in place. Abdomen: BS x 4, soft, NT/ND.  Musculoskeletal: No gross deformities, 1+ pitting bilateral lower ext edema.  Skin: Intact, warm, no rashes.     Recent Labs Lab 09/16/13 0702 09/17/13 0114 09/17/13 1139  NA 131* 134* 135*  K 3.8 3.0* 3.2*  CL 91* 87* 89*  CO2 30 36* 39*  BUN 58* 52* 43*  CREATININE 2.08* 1.92* 1.86*  GLUCOSE 84 106* 106*    Recent Labs Lab 09/12/13 1930 09/15/13 0907 09/16/13 0702 09/17/13 0114  HGB 9.0* 8.2* 7.2* 10.4*  HCT 25.3* 23.0* 20.2* 30.2*  WBC 16.6* 21.4* 16.6*  --   PLT 220 212 170  --    No results found.    Lab Results  Component Value Date   ESRSEDRATE 33* 09/05/2013   ESRSEDRATE 5 11/01/2012   ESRSEDRATE 6 05/03/2012     Lab Results  Component Value Date   PROBNP 3670.0* 09/04/2013     ASSESSMENT: Acute hypoxemia and respiratory distress - acute changes likely secondary to pulmonary edema, good response to diuresis. Now saturating 100% on 2 l [place] and resting comfortably. Paroxysmal A. Flutter - Rate controlled. No anticoagulation due to hematuria.  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 Lit review does show a 1-10% incidence of pneumonitis with brentuximab (anti CD 30 agent) Small right apical PTX ,Pneumomediastinum -cause of this   Unclear- resolved Hematuria - improving CKD - Creatinine trending down  likely due to diuresis.    RECOMMENDATIONS: - Continue diuresis  - Supplemental O2 as needed, wean as able. - Continue prednisone taper as planned.  Discussed with wife at bedside - pulmonary f/u can be prn at this point  Christinia Gully, MD Pulmonary and [place] [phone] After 5:30 PM or weekends, call [phone]

## 2013-09-17 NOTE — Progress Notes (Signed)
Second unit PRBC's started at 50 ml/hr.  Patient tolerating well.   Lasix 40 mg IV given between first and second units.

## 2013-09-17 NOTE — Plan of Care (Signed)
Problem: RH BLADDER ELIMINATION Goal: RH STG MANAGE BLADDER WITH ASSISTANCE STG Manage Bladder With Assistance. Mod I  Outcome: Not Progressing Patient with foley catheter for diuresis and medical issues

## 2013-09-18 ENCOUNTER — Other Ambulatory Visit: Payer: Self-pay

## 2013-09-18 ENCOUNTER — Inpatient Hospital Stay (HOSPITAL_COMMUNITY): Payer: Medicare Other

## 2013-09-18 ENCOUNTER — Inpatient Hospital Stay (HOSPITAL_COMMUNITY): Payer: Medicare Other | Admitting: Speech Pathology

## 2013-09-18 ENCOUNTER — Inpatient Hospital Stay (HOSPITAL_COMMUNITY): Payer: Medicare Other | Admitting: Physical Therapy

## 2013-09-18 ENCOUNTER — Encounter (HOSPITAL_COMMUNITY): Payer: Medicare Other | Admitting: Occupational Therapy

## 2013-09-18 ENCOUNTER — Inpatient Hospital Stay (HOSPITAL_COMMUNITY): Payer: Medicare Other | Admitting: Occupational Therapy

## 2013-09-18 DIAGNOSIS — R5381 Other malaise: Secondary | ICD-10-CM

## 2013-09-18 DIAGNOSIS — N183 Chronic kidney disease, stage 3 unspecified: Secondary | ICD-10-CM

## 2013-09-18 DIAGNOSIS — I2510 Atherosclerotic heart disease of native coronary artery without angina pectoris: Secondary | ICD-10-CM

## 2013-09-18 DIAGNOSIS — C8445 Peripheral T-cell lymphoma, not classified, lymph nodes of inguinal region and lower limb: Secondary | ICD-10-CM

## 2013-09-18 DIAGNOSIS — R634 Abnormal weight loss: Secondary | ICD-10-CM

## 2013-09-18 LAB — BASIC METABOLIC PANEL
BUN: 35 mg/dL — ABNORMAL HIGH (ref 6–23)
CHLORIDE: 89 meq/L — AB (ref 96–112)
CO2: 36 meq/L — AB (ref 19–32)
Calcium: 9.4 mg/dL (ref 8.4–10.5)
Creatinine, Ser: 1.55 mg/dL — ABNORMAL HIGH (ref 0.50–1.35)
GFR calc Af Amer: 50 mL/min — ABNORMAL LOW (ref >90)
GFR calc non Af Amer: 43 mL/min — ABNORMAL LOW (ref >90)
Glucose, Bld: 107 mg/dL — ABNORMAL HIGH (ref 70–99)
POTASSIUM: 3.5 meq/L — AB (ref 3.7–5.3)
SODIUM: 135 meq/L — AB (ref 137–147)

## 2013-09-18 NOTE — Plan of Care (Signed)
Problem: RH BLADDER ELIMINATION Goal: RH STG MANAGE BLADDER WITH ASSISTANCE STG Manage Bladder With Assistance. Mod I  Outcome: Not Progressing Pt currently have a Foley

## 2013-09-18 NOTE — Progress Notes (Signed)
Patient Name: Willie [name] Date of Encounter: 09/18/2013     Active Problems:   Physical deconditioning   Acute respiratory failure    SUBJECTIVE  Working with PT. He feels better today. No chest pain. Less dyspnea.  EKG 5/30 in afternoon showed that he was back in sinus rhythm.  On 5/31 - ECG once again showed AFLUTTER.  CURRENT MEDS . aspirin EC  81 mg Oral QPM  . atorvastatin  20 mg Oral Once per day on Sun Wed  . cholecalciferol  1,000 Units Oral Daily  . vitamin B-12  500 mcg Oral Daily  . docusate sodium  100 mg Oral BID  . furosemide  20 mg Oral Daily  . megestrol  200 mg Oral BID  . metoprolol tartrate  25 mg Oral q morning - 10a  . multivitamin with minerals  1 tablet Oral Daily  . oxymetazoline  1 spray Each Nare BID  . potassium chloride  10 mEq Oral Daily  . prasugrel  10 mg Oral QPM  . predniSONE  40 mg Oral Q breakfast  . sodium chloride  1 g Oral TID WC  . valACYclovir  500 mg Oral Daily    OBJECTIVE  Filed Vitals:   09/17/13 1557 09/17/13 2010 09/18/13 0008 09/18/13 0410  BP: 101/59 130/69 137/74 136/65  Pulse: 104 97 99 104  Temp: 97.7 F (36.5 C) 98.1 F (36.7 C) 97.6 F (36.4 C) 97.9 F (36.6 C)  TempSrc: Oral Oral Oral Oral  Resp: 20 20 21 21   Weight:    132 lb (59.875 kg)  SpO2: 99% 99% 99% 98%    Intake/Output Summary (Last 24 hours) at 09/18/13 1007 Last data filed at 09/18/13 0900  Gross per 24 hour  Intake    960 ml  Output   1650 ml  Net   -690 ml   Filed Weights   09/16/13 0403 09/17/13 0404 09/18/13 0410  Weight: 136 lb 12.8 oz (62.052 kg) 129 lb 6.4 oz (58.695 kg) 132 lb (59.875 kg)    PHYSICAL EXAM General: Pleasant, NAD. Color has improved. Neuro: Alert and oriented X 3. Moves all extremities spontaneously.  Psych: Normal affect.  HEENT: Normal  Neck: Supple without bruits or JVD.  Lungs: Rales and decreased breath sounds at bases.  Heart: Slightly irregular.  Grade 2/6 holosystolic apical murmur.    Abdomen: Soft, non-tender, non-distended, BS + x 4.  Extremities: Mild edema. DP/PT/Radials 2+ and equal bilaterally.   Accessory Clinical Findings  CBC  Recent Labs  09/16/13 0702 09/17/13 0114  WBC 16.6*  --   HGB 7.2* 10.4*  HCT 20.2* 30.2*  MCV 103.1*  --   PLT 170  --    Basic Metabolic Panel  Recent Labs  09/17/13 1139 09/18/13 0600  NA 135* 135*  K 3.2* 3.5*  CL 89* 89*  CO2 39* 36*  GLUCOSE 106* 107*  BUN 43* 35*  CREATININE 1.86* 1.55*  CALCIUM 9.1 9.4   Cardiac Enzymes  Recent Labs  09/15/13 1428  TROPONINI <0.30    Dg Chest Port 1 View  09/15/2013  IMPRESSION: Interstitial and alveolar edema consistent with acute congestive heart failure, worsened since 05/23.   Electronically Signed   By: Nelson Chimes M.D.   On: 09/15/2013 12:29    ASSESSMENT AND PLAN 1. Paroxysmal atrial flutter. CHADSVASc at least 2 (Age, HTN) 2. Acute on chronic diastolic heart failure exacerbated by anemia  3. CAD - CABG 2007. Cath in October 11, 2012 and had 2 DES placed in a 90% mid-RCA stenosis and 90% distal L main stenosis. Post cath, patient was placed on ASA and effient.  4. Moderate mitral regurgitation.  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 Anemia improved after transfusion. On DAPT likely lifelong given LM stent. 7. Hypokalemia 8. HTN - stable.   Will continue low dose lasix 20 mg daily. Back in aflutter but without symptoms currently Repleting KCL  Long term risks of full anticoagulation with bleeding/stroke in the setting of severe deconditioning and fall risks need to be taken into account.   Also, triple therapy would place him at high risk for bleeding complication. Periods of confusion and the fact that patient is not capable of making medical decisions make him a poor anticoagulation candidate.      Signed, Candee Furbish MD

## 2013-09-18 NOTE — Progress Notes (Signed)
Reviewed and in agreement with treatment provided.  

## 2013-09-18 NOTE — Progress Notes (Signed)
EKG done per Cardiology's order. Unconfirmed results showed Sinus tachycardia w/frequent Premature ventricular complexes with junctional escape complexes.   Silvestre Mesi, PA notified of results. Was advised by Linna Hoff to contact Cardiology. Spoke to Jenkins. Trish advised that she would have someone come by and review results.

## 2013-09-18 NOTE — Progress Notes (Signed)
Occupational Therapy Session Note  Patient Details  Name: Willie [name] MRN: [id number] Date of Birth: [birth year]  Today's Date: 09/18/2013 Time: 6967-8938 Time Calculation (min): 42 min  Short Term Goals: Week 1:  OT Short Term Goal 1 (Week 1): STGs = LTGs due to short LOS  Skilled Therapeutic Interventions/Progress Updates:  Patient with no complaints of pain  Per written order in chart, "May do bedside therapy today..max HR120".  Patient received supine in bed with wife present at bedside. Focused on bed mobility, activity tolerance, endurance for bathing and dressing at bed level. UB/LB bathing, donning of clean gown, pt was re-positioned in bed on right side with pillows between his legs and under his back secondary to h/o redness around sacrum, pt was decreased reddness noted today. HR and 02 checked during, pt on 2L O2 throughout & maintained O2 92-94%. Pt resting w/ call bell and wife in room at conclusion of session.      Therapy Documentation Precautions:  Precautions Precautions: Fall Precaution Comments: Respiratory Sats Restrictions Weight Bearing Restrictions: No General:   Vital Signs:   Pain:  No c/o pain, denies pain.  ADL: ADL ADL Comments: Refer to FIM   See FIM for current functional status  Therapy/Group: Individual Therapy  Amy B Barnhill 09/18/2013, 9:14 AM

## 2013-09-18 NOTE — Progress Notes (Signed)
Speech Language Pathology Daily Session Note  Patient Details  Name: Willie [name] MRN: [id number] Date of Birth: [birth year]  Today's Date: 09/18/2013 Time: 1115-1200 Time Calculation (min): 45 min  Short Term Goals: Week 1: SLP Short Term Goal 1 (Week 1): Pt will utilize external memory aids to recall new, daily information with Min A multimodal cues.  SLP Short Term Goal 2 (Week 1): Pt will demonstrate functioanl problem solving for basic and familiar tasks with supervision multimodal cues.   Skilled Therapeutic Interventions:  Pt was seen for skilled speech therapy targeting memory, attention, and functional problem solving.  Pt was seated upright in recliner upon arrival; lethargic, but agreeable to participate in speech therapy.  Pt was observed with presentations of his prescribed diet and did not exhibit any oral holding or overt s/s of aspiration; however, pt benefited from min cuing for small sips when drinking as he was noted to take large cup sips x1 which resulted in soft s/s of aspiration characterized by a delayed throat clear.  Overall, pt was noted with improved processing speed when engaged in semi-structured distraction activities which thereby decreased his incidence of oral holding and response time in functional conversations. Pt required mod-max cuing for awareness, attention, and mental flexibility to identify 5 goals to target in physical therapy per PT's request as pt has exhibited decreased motivation and participation in therapy.  SLP educated pt regarding the rationale behind CIR in maximizing functional independence and decreasing burden of care upon discharge.  Pt verbalized understanding and signed the sheet of targeted physical therapy goals to indicate that he was in agreement with his plan of care.    FIM:  Comprehension Comprehension Mode: Auditory Comprehension: 5-Follows basic conversation/direction: With extra time/assistive device Expression Expression  Mode: Verbal Expression: 5-Expresses basic needs/ideas: With extra time/assistive device Social Interaction Social Interaction: 5-Interacts appropriately 90% of the time - Needs monitoring or encouragement for participation or interaction. Problem Solving Problem Solving: 4-Solves basic 75 - 89% of the time/requires cueing 10 - 24% of the time Memory Memory: 3-Recognizes or recalls 50 - 74% of the time/requires cueing 25 - 49% of the time FIM - Eating Eating Activity: 5: Supervision/cues  Pain Pain Assessment Pain Assessment: No/denies pain  Therapy/Group: Individual Therapy  Willie [name], M.A. CCC-SLP  Willie [name] Willie [name] 09/18/2013, 12:57 PM

## 2013-09-18 NOTE — Progress Notes (Signed)
Physical Therapy Note  Patient Details  Name: Willie [name] MRN: [id number] Date of Birth: April 04, 1941 Today's Date: 09/18/2013  Time: 10:00 - 10:25 25 minutes  1:1, no c/o pain.  Per MD orders "PT as tolerated."  Pt received supine, HOB elevated, spO2 at 99%.  Required max encouragement for participation in PT, discussed importance of mobility.  Mod/max A for supine > sit.  Sit <> stand with mod/max A.  Ambulated 5' to bedside chair with mod A, spO2 at 87% upon sitting, cueing for breathing in through nose and pursed lip breathing.  SpO2 returned to 99% in < 60 sec.  Seated LAQ x 5 on L, x 2 on R, pt refused further PT.  Discussed importance of mobility and goals, asked pt to set 5 goals for the focus of therapy to encourage participation.  Kenn File 09/18/2013, 12:00 PM

## 2013-09-18 NOTE — Progress Notes (Signed)
I have read and agree with this treatment note.  Georjean Mode, PT

## 2013-09-18 NOTE — Progress Notes (Signed)
Occupational Therapy Session Note  Patient Details  Name: Willie [name] MRN: [id number] Date of Birth: [birth year]  Today's Date: 09/18/2013 Time: 7253-6644 Time Calculation (min): 22 min  Short Term Goals: Week 2:     Skilled Therapeutic Interventions/Progress Updates:    Attempted to see patient this pm.  Patient agreeable to visit but declined any opportunity to shift forward in [place] chair, do seated exercise, or attempt to lift off chair or stand.  Offered to assist patient back to bed. Patient verbally declined, and stated he would not move.  "I just can't" was repeated response.  Patient pushing backward into chair with any attempt to flex forward.  Patient did participate in setaed leg and arm exercises through 5 repetitions of each extremity, but then closed his eyes and did not participate further.  Patient left in recliner with call bell and phone in lap.  Patient able to identify proper button to push when in need of nurse.  Patient's wife not present for this session.    Therapy Documentation Precautions:  Precautions Precautions: Fall Precaution Comments: Respiratory Sats Restrictions Weight Bearing Restrictions: No General: General Amount of Missed OT Time (min): 23 Minutes   Pain: Pain Assessment Pain Assessment: No/denies pain ADL: ADL ADL Comments: Refer to FIM  See FIM for current functional status  Therapy/Group: Individual Therapy  Mariah Milling 09/18/2013, 2:53 PM

## 2013-09-18 NOTE — Progress Notes (Signed)
[facility] [facility] PHYSICAL MEDICINE & REHABILITATION     PROGRESS NOTE    Subjective/Complaints: Feeling better. Good appetite with megace. Hasn't submitted stool specimen yet. Slept well.    review of systems has been performed and if not noted above is otherwise negative.   Objective: Vital Signs: Blood pressure 136/65, pulse 104, temperature 97.9 F (36.6 C), temperature source Oral, resp. rate 21, weight 59.875 kg (132 lb), SpO2 98.00%. Dg Chest 2 View  09/17/2013   CLINICAL DATA:  Shortness of breath, CHF  EXAM: CHEST  2 VIEW  COMPARISON:  09/15/2013  FINDINGS: Moderate interstitial/ early alveolar opacities, left lower lobe predominant, suggesting moderate interstitial edema. Multifocal infection is considered less likely but is not entirely excluded. This appearance is unchanged.  No pleural effusion or pneumothorax.  Cardiomegaly.  Postsurgical changes related to prior CABG.  Visualized osseous structures are within normal limits.  IMPRESSION: Suspected stable moderate interstitial edema.  Multifocal infection is considered less likely but is not entirely excluded.   Electronically Signed   By: Julian Hy M.D.   On: 09/17/2013 13:08    Recent Labs  09/15/13 0907 09/16/13 0702 09/17/13 0114  WBC 21.4* 16.6*  --   HGB 8.2* 7.2* 10.4*  HCT 23.0* 20.2* 30.2*  PLT 212 170  --     Recent Labs  09/17/13 1139 09/18/13 0600  NA 135* 135*  K 3.2* 3.5*  CL 89* 89*  GLUCOSE 106* 107*  BUN 43* 35*  CREATININE 1.86* 1.55*  CALCIUM 9.1 9.4   CBG (last 3)   Recent Labs  09/15/13 1123  GLUCAP 186*    Wt Readings from Last 3 Encounters:  09/18/13 59.875 kg (132 lb)  09/10/13 60.782 kg (134 lb)  08/30/13 60.51 kg (133 lb 6.4 oz)    Physical Exam:   Constitutional: He is oriented to person, place, and time.  73 year old frail Caucasian male  HENT: mucosa pink/moist, tongue a little dry  Head: Normocephalic.  Eyes: EOM are normal.  Neck: Normal range of motion.  Neck supple. No thyromegaly present.  Cardiovascular: Normal rate and regular rhythm. Systolic murmur  Respiratory:  Breathing appears comfortable. Non-labored. No rales GI: Soft. Bowel sounds are normal. He exhibits no distension.  Neurological: He is alert and oriented to person, place, and time.  motor strength is 4+/5 bilateral deltoid, bicep, tricep, grip, hip flexors, knee extensors, ankle dorsiflexion plantar flexion  Sensation is intact to light touch and proprioception bilateral upper and lower limbs  Patient moves with delay, needs repeated cueing at times.  Psych: affect flat, cooperative.     Assessment/Plan: 1. Functional deficits secondary to deconditioning after pneumonia/respiratory failure which require 3+ hours per day of interdisciplinary therapy in a comprehensive inpatient rehab setting. Physiatrist is providing close team supervision and 24 hour management of active medical problems listed below. Physiatrist and rehab team continue to assess barriers to discharge/monitor patient progress toward functional and medical goals.   FIM: FIM - Bathing Bathing Steps Patient Completed: Chest;Right Arm;Left Arm;Abdomen;Front perineal area;Buttocks;Right upper leg;Left upper leg Bathing: 0: Activity did not occur (Pt reports that he physically cannot participate in ADL's, too weak. )  FIM - Upper Body Dressing/Undressing Upper body dressing/undressing steps patient completed: Thread/unthread right sleeve of pullover shirt/dresss;Thread/unthread left sleeve of pullover shirt/dress;Put head through opening of pull over shirt/dress;Pull shirt over trunk Upper body dressing/undressing: 0: Activity did not occur (Pt states he physically cannot participate in ADl's, too weak) FIM - Lower Body Dressing/Undressing Lower  body dressing/undressing steps patient completed: Pull underwear up/down;Pull pants up/down Lower body dressing/undressing: 0: Activity did not occur (Pt unable to  participate)  FIM - Toileting Toileting steps completed by patient: Performs perineal hygiene;Adjust clothing prior to toileting Toileting Assistive Devices: Grab bar or rail for support Toileting: 1: Two helpers  FIM - Radio producer Devices: Product manager Transfers: 3-From toilet/BSC: Mod A (lift or lower assist)  FIM - Control and instrumentation engineer Devices: Bed rails;Arm rests Bed/Chair Transfer: 0: Activity did not occur  FIM - Locomotion: Wheelchair Distance: 60 Locomotion: Wheelchair: 0: Activity did not occur FIM - Locomotion: Ambulation Locomotion: Ambulation Assistive Devices: Administrator Ambulation/Gait Assistance: 4: Min guard Locomotion: Ambulation: 0: Activity did not occur  Comprehension Comprehension Mode: Auditory Comprehension: 5-Understands complex 90% of the time/Cues < 10% of the time  Expression Expression Mode: Verbal Expression: 5-Expresses basic needs/ideas: With extra time/assistive device  Social Interaction Social Interaction: 5-Interacts appropriately 90% of the time - Needs monitoring or encouragement for participation or interaction.  Problem Solving Problem Solving: 4-Solves basic 75 - 89% of the time/requires cueing 10 - 24% of the time  Memory Memory: 3-Recognizes or recalls 50 - 74% of the time/requires cueing 25 - 49% of the time Medical Problem List and Plan:  1. Functional deficits secondary to deconditioning after pneumonia, respiratory failure with history of non-Hodgkin's lymphoma  2. DVT Prophylaxis/Anticoagulation: off lovenox, urine looks concentrated some dark blood but no longer "cherry red"  3. Pain Management: Hydrocodone as needed. Monitor with increased mobility  4. Mood: Bouts of confusion likely related to multiple medical issues 5. Neuropsych: This patient is not capable of making decisions on his own behalf.  6. Non-Hodgkin's lymphoma. Followup per Dr.Gorsuch.  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 Chemotherapy program to be reevaluated as outpatient    7. Hypertension. Lopressor 25 mg daily. Monitor with increased mobility  8. CAD with CABG/fluid overload/a flutter.   Continue Effient 10 mg daily as well as aspirin   -rate controlled. Breathing better.   -appreciate cards help  -up as tolerated today  -dc foley 9. Hyperlipidemia. Lipitor  10. Chronic renal insufficiency. Baseline CR around 1.5 which we are near now. 11. Hyponatremia resolved 12.  Hematuria improved off of lovenox 13.  Hypokalemia: continue repletion 14. Anemia: s/p blood transfusion:   -urine clear  -still don't have a stool specimen for ob.   LOS (Days) 6 A FACE TO FACE EVALUATION WAS PERFORMED  Meredith Staggers 09/18/2013 8:13 AM

## 2013-09-18 NOTE — Progress Notes (Signed)
Physical Therapy Note  Patient Details  Name: [facility] [name] MRN: [id number] Date of Birth: 1941-03-09 Today's Date: 09/18/2013  Time: 1300 - 1330 30 minutes  1:1, no c/o pain.  Pt declined participation in functional activities related to goals set during ST, strength noted in active resistance to standing.  Attempted to stand from recliner in variety of settings, +2 total A, active pt resistance to standing and pt returned to recliner.  10 x ball toss with total assist from PT due to pt refusal.  Pt required max encouragement, participation in session limited by motivation.  SpO2 > 90% throughout session.  Kenn File 09/18/2013, 1:39 PM

## 2013-09-19 ENCOUNTER — Inpatient Hospital Stay (HOSPITAL_COMMUNITY): Payer: Medicare Other | Admitting: Occupational Therapy

## 2013-09-19 ENCOUNTER — Inpatient Hospital Stay (HOSPITAL_COMMUNITY): Payer: Medicare Other | Admitting: Speech Pathology

## 2013-09-19 ENCOUNTER — Inpatient Hospital Stay (HOSPITAL_COMMUNITY): Payer: Medicare Other | Admitting: Physical Therapy

## 2013-09-19 DIAGNOSIS — I10 Essential (primary) hypertension: Secondary | ICD-10-CM

## 2013-09-19 MED ORDER — POLYETHYLENE GLYCOL 3350 17 G PO PACK
17.0000 g | PACK | Freq: Once | ORAL | Status: AC
  Administered 2013-09-19: 17 g via ORAL
  Filled 2013-09-19: qty 1

## 2013-09-19 MED ORDER — MAGIC MOUTHWASH
5.0000 mL | Freq: Three times a day (TID) | ORAL | Status: DC
  Administered 2013-09-19 – 2013-09-20 (×4): 5 mL via ORAL
  Filled 2013-09-19 (×7): qty 5

## 2013-09-19 MED ORDER — BIOTENE DRY MOUTH MT LIQD: 15.0000 mL | OROMUCOSAL | Status: DC | PRN

## 2013-09-19 MED ORDER — SALINE SPRAY 0.65 % NA SOLN
1.0000 | NASAL | Status: DC
  Administered 2013-09-19 – 2013-09-20 (×6): 1 via NASAL
  Filled 2013-09-19: qty 44

## 2013-09-19 NOTE — Progress Notes (Signed)
Reviewed and in agreement with treatment provided.  

## 2013-09-19 NOTE — Treatment Plan (Signed)
Therapy Treatment Plan Note:  Therapy schedule has been decreased to 15 hours over a 7 day period as per MD order following team conference.  Patient with slow progress, has declined to participate and has decreased ability to fully participate in therapy at this time.  Blanchard Mane, MS, OTR/L Occupational Therapist

## 2013-09-19 NOTE — Progress Notes (Signed)
Occupational Therapy Session Note  Patient Details  Name: EDGARDO PETRENKO MRN: [id number] Date of Birth: 07-15-1940  Today's Date: 09/19/2013 Time: 1410-1455 Time Calculation (min): 45 min  Short Term Goals: Week 1:  OT Short Term Goal 1 (Week 1): STGs = LTGs due to short LOS  Skilled Therapeutic Interventions/Progress Updates:    Patient seen this pm to address activity participation, endurance, and general strength while addressing functional transfers in ADL apartment.  Patient's wife present for therapy session this afternoon, and patient more participative than yesterday afternoon.  Patient transitioned from sit to stand from various height surfaceswith no greater than minimal assistance.  Patient needed verbal prompting to initiate each step of the task. And long pauses between transitions to recover breathing and energy level.  Patient needed (physical and/or verbal) cues for transition of hands from pushing off surface to walker.  Patient did best with slow pace and short bursts of activity.    Therapy Documentation Precautions:  Precautions Precautions: Fall Precaution Comments: Respiratory Sats Restrictions Weight Bearing Restrictions: No   Vital Signs: BP:  135/82 O2 Sat:  95%  Pain:  No report of pain ADL: ADL ADL Comments: Refer to FIM  See FIM for current functional status  Therapy/Group: Individual Therapy  Mariah Milling 09/19/2013, 3:20 PM

## 2013-09-19 NOTE — Progress Notes (Signed)
[name] PH[facility] MEDICINE & REHABILITATION     PROGRESS NOTE    Subjective/Complaints: Had a fair night. Still not eating what he should. Doesn't have energy back.    review of systems has been performed and if not noted above is otherwise negative.   Objective: Vital Signs: Blood pressure 126/76, pulse 114, temperature 98 F (36.7 C), temperature source Oral, resp. rate 20, weight 59.966 kg (132 lb 3.2 oz), SpO2 95.00%. Dg Chest 2 View  09/17/2013   CLINICAL DATA:  Shortness of breath, CHF  EXAM: CHEST  2 VIEW  COMPARISON:  09/15/2013  FINDINGS: Moderate interstitial/ early alveolar opacities, left lower lobe predominant, suggesting moderate interstitial edema. Multifocal infection is considered less likely but is not entirely excluded. This appearance is unchanged.  No pleural effusion or pneumothorax.  Cardiomegaly.  Postsurgical changes related to prior CABG.  Visualized osseous structures are within normal limits.  IMPRESSION: Suspected stable moderate interstitial edema.  Multifocal infection is considered less likely but is not entirely excluded.   Electronically Signed   By: Julian Hy M.D.   On: 09/17/2013 13:08    Recent Labs  09/17/13 0114  HGB 10.4*  HCT 30.2*    Recent Labs  09/17/13 1139 09/18/13 0600  NA 135* 135*  K 3.2* 3.5*  CL 89* 89*  GLUCOSE 106* 107*  BUN 43* 35*  CREATININE 1.86* 1.55*  CALCIUM 9.1 9.4   CBG (last 3)  No results found for this basename: GLUCAP,  in the last 72 hours  Wt Readings from Last 3 Encounters:  09/19/13 59.966 kg (132 lb 3.2 oz)  09/10/13 60.782 kg (134 lb)  08/30/13 60.51 kg (133 lb 6.4 oz)    Physical Exam:   Constitutional: He is oriented to person, place, and time.  73 year old frail Caucasian male  HENT: mucosa pink/moist, tongue a little dry  Head: Normocephalic.  Eyes: EOM are normal.  Neck: Normal range of motion. Neck supple. No thyromegaly present.  Cardiovascular: Normal rate and regular  rhythm. Systolic murmur  Respiratory:  Breathing appears comfortable. Non-labored. No rales. Still uses accessory muscles to breathe GI: Soft. Bowel sounds are normal. He exhibits no distension.  Neurological: He is alert and oriented to person, place, and time.  motor strength is 4+/5 bilateral deltoid, bicep, tricep, grip, hip flexors, knee extensors, ankle dorsiflexion plantar flexion  Sensation is intact to light touch and proprioception bilateral upper and lower limbs  Patient moves with delay, needs repeated cueing at times.  Psych: affect flat, cooperative.     Assessment/Plan: 1. Functional deficits secondary to deconditioning after pneumonia/respiratory failure which require 3+ hours per day of interdisciplinary therapy in a comprehensive inpatient rehab setting. Physiatrist is providing close team supervision and 24 hour management of active medical problems listed below. Physiatrist and rehab team continue to assess barriers to discharge/monitor patient progress toward functional and medical goals.   FIM: FIM - Bathing Bathing Steps Patient Completed: Chest;Right Arm;Left Arm;Abdomen;Front perineal area;Right upper leg;Left upper leg Bathing: 3: Mod-Patient completes 5-7 21f 10 parts or 50-74%  FIM - Upper Body Dressing/Undressing Upper body dressing/undressing steps patient completed:  (Pt donned clean gown after sponge bathe bed level) Upper body dressing/undressing: 0: Activity did not occur FIM - Lower Body Dressing/Undressing Lower body dressing/undressing steps patient completed:  (Pt was assisted w/ TED hose & socks) Lower body dressing/undressing: 1: Total-Patient completed less than 25% of tasks  FIM - Toileting Toileting steps completed by patient: Performs perineal hygiene;Adjust clothing  prior to toileting Toileting Assistive Devices: Grab bar or rail for support Toileting: 0: Activity did not occur  FIM - Radio producer Devices:  Grab bars Toilet Transfers: 0-Activity did not occur  FIM - Control and instrumentation engineer Devices: Bed rails;Arm rests Bed/Chair Transfer: 0: Activity did not occur  FIM - Locomotion: Wheelchair Distance: 60 Locomotion: Wheelchair: 0: Activity did not occur FIM - Locomotion: Ambulation Locomotion: Ambulation Assistive Devices: Administrator Ambulation/Gait Assistance: 4: Min guard Locomotion: Ambulation: 0: Activity did not occur  Comprehension Comprehension Mode: Auditory Comprehension: 5-Understands complex 90% of the time/Cues < 10% of the time  Expression Expression Mode: Verbal Expression: 5-Expresses basic 90% of the time/requires cueing < 10% of the time.  Social Interaction Social Interaction: 5-Interacts appropriately 90% of the time - Needs monitoring or encouragement for participation or interaction.  Problem Solving Problem Solving: 4-Solves basic 75 - 89% of the time/requires cueing 10 - 24% of the time  Memory Memory: 3-Recognizes or recalls 50 - 74% of the time/requires cueing 25 - 49% of the time Medical Problem List and Plan:  1. Functional deficits secondary to deconditioning after pneumonia, respiratory failure with history of non-Hodgkin's lymphoma  2. DVT Prophylaxis/Anticoagulation: off lovenox, urine looks concentrated some dark blood but no longer "cherry red"  3. Pain Management: Hydrocodone as needed. Monitor with increased mobility  4. Mood: Bouts of confusion likely related to multiple medical issues 5. Neuropsych: This patient is not capable of making decisions on his own behalf.  6. Non-Hodgkin's lymphoma. Followup per Dr.Gorsuch. 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 Chemotherapy program to be reevaluated as outpatient    7. Hypertension. Lopressor 25 mg daily. Monitor with increased mobility  8. CAD with CABG/fluid overload/a flutter.   Continue Effient 10 mg daily as well as aspirin    -rate controlled. Breathing better.   -appreciate cards help  -up as tolerated today  -daily lasix. Might need a little more as weight is trending up again 9. Hyperlipidemia. Lipitor  10. Chronic renal insufficiency. Baseline CR around 1.5 which we are near now. 11. Hyponatremia resolved 12.  Hematuria improved off of lovenox 13.  Hypokalemia: continue repletion 14. Anemia: s/p blood transfusion:   -urine clear  -still don't have a stool specimen for ob.   LOS (Days) 7 A FACE TO FACE EVALUATION WAS PERFORMED  Meredith Staggers 09/19/2013 7:59 AM

## 2013-09-19 NOTE — Progress Notes (Signed)
Occupational Therapy Session Note  Patient Details  Name: Willie [name] MRN: [id number] Date of Birth: Feb 14, 1941  Today's Date: 09/19/2013 Time: 1105-1205 Time Calculation (min): 60 min  Short Term Goals: Week 1:  OT Short Term Goal 1 (Week 1): STGs = LTGs due to short LOS  Skilled Therapeutic Interventions/Progress Updates:  Patient resting in recliner upon arrival with wife by his side.  Informed by physical therapist prior to session that SPO2 dropped significantly after ambulation ~12 feet and required increased time to get SPO2 up to acceptable level (see PT note for details).  See Vital Signs below for SPO2 during this OT session.  Patient agreeable to sponge bath and dressing if he could perform it from the recliner instead of the sink.  Focused session on activity tolerance, education on pursed lip breathing and need for patient to continue to be active with all BADL tasks and encourage wife to allow patient to complete tasks prior to completing tasks for him.  Wife denies any physical issues of her own and reports she would be able to care for her husband at home. Please note that all of OT LTGs have been downgraded or discharged due to slow progress and decreased ability to fully participate in therapy.  Therapy Documentation Precautions:  Precautions Precautions: Fall Precaution Comments: Respiratory Sats Restrictions Weight Bearing Restrictions: No Vital Signs: SPO2 during BADL tasks with OT on 3.5L 1115: 83%-91% (~30 seconds of pursed lip breathing to bring SATS up to 91%)  1135: 93% 1155: 93% Pain: Denies pain ADL: See FIM for current functional status  Therapy/Group: Individual Therapy  Gaye Pollack 09/19/2013, 4:00 PM

## 2013-09-19 NOTE — Plan of Care (Signed)
Problem: RH BOWEL ELIMINATION Goal: RH STG MANAGE BOWEL WITH ASSISTANCE STG Manage Bowel with Assistance. Mod I  Outcome: Not Progressing LBM 5/27  Problem: RH BLADDER ELIMINATION Goal: RH STG MANAGE BLADDER WITH ASSISTANCE STG Manage Bladder With Assistance. Mod I  Outcome: Not Progressing Foley catheter intact, staff managed

## 2013-09-19 NOTE — Progress Notes (Signed)
Subjective: No chest pain, no SOB, resting after rehab this am and for bath in next 5 min.  Soes wear TED hose during the day after bath  Objective: Vital signs in last 24 hours: Temp:  [97.5 F (36.4 C)-98 F (36.7 C)] 98 F (36.7 C) (06/02 0344) Pulse Rate:  [98-114] 99 (06/02 0957) Resp:  [20-24] 24 (06/02 0957) BP: (108-144)/(72-81) 108/74 mmHg (06/02 0957) SpO2:  [95 %-98 %] 98 % (06/02 0957) Weight:  [132 lb 3.2 oz (59.966 kg)] 132 lb 3.2 oz (59.966 kg) (06/02 0550) Weight change: 3.2 oz (0.091 kg) Last BM Date: 09/13/13 (PRN senna administered) Intake/Output from previous day: -175  (-8312)  06/01 0701 - 06/02 0700 In: 1200 [P.O.:1200] Out: 1375 [Urine:1375] Intake/Output this shift: Total I/O In: 600 [P.O.:600] Out: 700 [Urine:700]  PE: General:Pleasant affect, NAD Skin:Warm and dry, brisk capillary refill HEENT:normocephalic, sclera clear, mucus membranes moist Neck:supple, no JVD, no bruits  Heart:irreg irreg with 2/6  murmur, no gallup, rub or click Lungs:clear without rales, rhonchi, or wheezes XLK:GMWN, non tender, + BS, do not palpate liver spleen or masses Ext:1+ lower ext edema, 2+ pedal pulses, 2+ radial pulses Neuro:alert and oriented, MAE, follows commands, + facial symmetry   Lab Results:  Recent Labs  09/17/13 0114  HGB 10.4*  HCT 30.2*   BMET  Recent Labs  09/17/13 1139 09/18/13 0600  NA 135* 135*  K 3.2* 3.5*  CL 89* 89*  CO2 39* 36*  GLUCOSE 106* 107*  BUN 43* 35*  CREATININE 1.86* 1.55*  CALCIUM 9.1 9.4   No results found for this basename: TROPONINI, CK, MB,  in the last 72 hours  No results found for this basename: CHOL, HDL, LDLCALC, LDLDIRECT, TRIG, CHOLHDL   No results found for this basename: HGBA1C     Lab Results  Component Value Date   TSH 1.860 09/05/2013      Studies/Results: Dg Chest 2 View  09/17/2013   CLINICAL DATA:  Shortness of breath, CHF  EXAM: CHEST  2 VIEW  COMPARISON:  09/15/2013   FINDINGS: Moderate interstitial/ early alveolar opacities, left lower lobe predominant, suggesting moderate interstitial edema. Multifocal infection is considered less likely but is not entirely excluded. This appearance is unchanged.  No pleural effusion or pneumothorax.  Cardiomegaly.  Postsurgical changes related to prior CABG.  Visualized osseous structures are within normal limits.  IMPRESSION: Suspected stable moderate interstitial edema.  Multifocal infection is considered less likely but is not entirely excluded.   Electronically Signed   By: Julian Hy M.D.   On: 09/17/2013 13:08    Medications: I have reviewed the patient's current medications. Scheduled Meds: . aspirin EC  81 mg Oral QPM  . atorvastatin  20 mg Oral Once per day on Sun Wed  . cholecalciferol  1,000 Units Oral Daily  . vitamin B-12  500 mcg Oral Daily  . docusate sodium  100 mg Oral BID  . furosemide  20 mg Oral Daily  . magic mouthwash  5 mL Oral TID  . megestrol  200 mg Oral BID  . metoprolol tartrate  25 mg Oral q morning - 10a  . multivitamin with minerals  1 tablet Oral Daily  . oxymetazoline  1 spray Each Nare BID  . potassium chloride  10 mEq Oral Daily  . prasugrel  10 mg Oral QPM  . predniSONE  40 mg Oral Q breakfast  . sodium chloride  1 spray Each Nare  Q2H  . sodium chloride  1 g Oral TID WC  . valACYclovir  500 mg Oral Daily   Continuous Infusions:  PRN Meds:.acetaminophen, albuterol, antiseptic oral rinse, HYDROcodone-acetaminophen, nitroGLYCERIN, ondansetron (ZOFRAN) IV, ondansetron, senna-docusate, sorbitol  Assessment/Plan: 1. Paroxysmal atrial flutter. CHADSVASc at least 2 (Age, HTN)  2. Acute on chronic diastolic heart failure exacerbated by anemia  3. CAD - CABG 2007. Cath in October 11, 2012 and had 2 DES placed in a 90% mid-RCA stenosis and 90% distal L main stenosis. Post cath, patient was placed on ASA and effient.  4. Moderate mitral regurgitation.  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 Anemia  improved after transfusion. On DAPT likely lifelong given LM stent.  7. Hypokalemia  8. HTN - stable.  Will continue low dose lasix 20 mg daily.  Back in aflutter but without symptoms currently , rate controlled Replete KCL  Long term risks of full anticoagulation with bleeding/stroke in the setting of severe deconditioning and fall risks need to be taken into account.  Also, triple therapy would place him at high risk for bleeding complication. Periods of confusion and the fact that patient is not capable of making medical decisions make him a poor anticoagulation candidate.      LOS: 7 days   Time spent with pt. :15 minutes. Cecilie Kicks  Nurse Practitioner Certified Pager [phone] or after 5pm and on weekends call [phone] 09/19/2013, 10:50 AM   Personally seen and examined. Agree with above. AFlutter present once again. Reasonable rate control. Continue metoprolol.  Lasix 20, daily. O2 sats in 80's with activity. Continue.   Candee Furbish, MD

## 2013-09-19 NOTE — Progress Notes (Signed)
Speech Language Pathology Daily Session Note  Patient Details  Name: Willie [name] MRN: [id number] Date of Birth: March 04, 1941  Today's Date: 09/19/2013 Time: 0835-0900 Time Calculation (min): 25 min  Short Term Goals: Week 1: SLP Short Term Goal 1 (Week 1): Pt will utilize external memory aids to recall new, daily information with Min A multimodal cues.  SLP Short Term Goal 2 (Week 1): Pt will demonstrate functioanl problem solving for basic and familiar tasks with supervision multimodal cues.   Skilled Therapeutic Interventions: Pt was seen for skilled speech therapy targeting memory and functional problem solving.  Pt required minimal encouragement to get out of bed and participate in skilled speech therapy, which indicates a marked improvement from previous therapy sessions.  SLP facilitated the session with a structured wayfinding task targeting functional problem solving and memory.  Following initial orientation to a basic route, pt required min assist for wayfinding from the day room to his room.  Pt was noted to use at least 1 external aid to reorient to the targeted route with mod assist.  Of note, pt was noted to become short of breath when propelling the wheel chair short distances and benefited from frequent rest breaks.  PT made aware during handoff for following therapy session.  Continue per current plan of care.   FIM:  Comprehension Comprehension Mode: Auditory Comprehension: 5-Follows basic conversation/direction: With extra time/assistive device Expression Expression: 5-Expresses basic 90% of the time/requires cueing < 10% of the time. Social Interaction Social Interaction: 5-Interacts appropriately 90% of the time - Needs monitoring or encouragement for participation or interaction. Problem Solving Problem Solving: 4-Solves basic 75 - 89% of the time/requires cueing 10 - 24% of the time Memory Memory: 3-Recognizes or recalls 50 - 74% of the time/requires cueing 25 -  49% of the time  Pain Pain Assessment Pain Assessment: No/denies pain  Therapy/Group: Individual Therapy  Windell Moulding, M.A. CCC-SLP   Selinda Orion Ronalda Walpole 09/19/2013, 9:59 AM

## 2013-09-19 NOTE — Progress Notes (Signed)
Physical Therapy Note  Patient Details  Name: Willie [name] MRN: [id number] Date of Birth: 02-16-1941 Today's Date: 09/19/2013  Time: 9:00 - 9:40 40 minutes  1:1, no c/o pain.  Pt received sitting in w/c on 3L O2, agreeable to therapy session and mobility with strong encouragement from PT.  Propelled w/c 20' and 35' in controlled environment with supervision.  Sit <> stand x 2 with mod A.  Ambulated 12' using RW with min A, limited by fatigue.  Upon sitting, spO2 at 80%, O2 increased to 4L and performed deep breaths and pursed lip breathing.  O2 failed to rise above 85% within 3 minutes, RN notified, RN advised to discontinue therapy session.  SpO2 reached 94% within 12 minutes, however still fluctuating.  Pt left on 4L O2 sitting in w/c with RN and wife present.  Kenn File 09/19/2013, 9:41 AM

## 2013-09-20 ENCOUNTER — Inpatient Hospital Stay (HOSPITAL_COMMUNITY): Payer: Medicare Other | Admitting: Speech Pathology

## 2013-09-20 ENCOUNTER — Inpatient Hospital Stay (HOSPITAL_COMMUNITY)
Admission: EM | Admit: 2013-09-20 | Discharge: 2013-09-25 | DRG: 196 | Disposition: A | Discharge status: Hospice | Payer: Medicare Other | Source: Other Acute Inpatient Hospital | Attending: Internal Medicine | Admitting: Internal Medicine

## 2013-09-20 ENCOUNTER — Inpatient Hospital Stay (HOSPITAL_COMMUNITY): Payer: Medicare Other | Admitting: Occupational Therapy

## 2013-09-20 ENCOUNTER — Inpatient Hospital Stay (HOSPITAL_COMMUNITY): Payer: Medicare Other

## 2013-09-20 ENCOUNTER — Inpatient Hospital Stay (HOSPITAL_COMMUNITY): Payer: Medicare Other | Admitting: Physical Therapy

## 2013-09-20 DIAGNOSIS — N2889 Other specified disorders of kidney and ureter: Secondary | ICD-10-CM

## 2013-09-20 DIAGNOSIS — R339 Retention of urine, unspecified: Secondary | ICD-10-CM | POA: Diagnosis not present

## 2013-09-20 DIAGNOSIS — D649 Anemia, unspecified: Secondary | ICD-10-CM

## 2013-09-20 DIAGNOSIS — D630 Anemia in neoplastic disease: Secondary | ICD-10-CM | POA: Diagnosis present

## 2013-09-20 DIAGNOSIS — J189 Pneumonia, unspecified organism: Secondary | ICD-10-CM

## 2013-09-20 DIAGNOSIS — T502X5A Adverse effect of carbonic-anhydrase inhibitors, benzothiadiazides and other diuretics, initial encounter: Secondary | ICD-10-CM | POA: Diagnosis present

## 2013-09-20 DIAGNOSIS — C8589 Other specified types of non-Hodgkin lymphoma, extranodal and solid organ sites: Secondary | ICD-10-CM | POA: Diagnosis present

## 2013-09-20 DIAGNOSIS — D72829 Elevated white blood cell count, unspecified: Secondary | ICD-10-CM | POA: Diagnosis present

## 2013-09-20 DIAGNOSIS — I4892 Unspecified atrial flutter: Secondary | ICD-10-CM

## 2013-09-20 DIAGNOSIS — T380X5A Adverse effect of glucocorticoids and synthetic analogues, initial encounter: Secondary | ICD-10-CM | POA: Diagnosis present

## 2013-09-20 DIAGNOSIS — I4510 Unspecified right bundle-branch block: Secondary | ICD-10-CM

## 2013-09-20 DIAGNOSIS — R634 Abnormal weight loss: Secondary | ICD-10-CM

## 2013-09-20 DIAGNOSIS — I5033 Acute on chronic diastolic (congestive) heart failure: Secondary | ICD-10-CM | POA: Diagnosis present

## 2013-09-20 DIAGNOSIS — M818 Other osteoporosis without current pathological fracture: Secondary | ICD-10-CM

## 2013-09-20 DIAGNOSIS — I10 Essential (primary) hypertension: Secondary | ICD-10-CM

## 2013-09-20 DIAGNOSIS — T387X5A Adverse effect of androgens and anabolic congeners, initial encounter: Secondary | ICD-10-CM

## 2013-09-20 DIAGNOSIS — I369 Nonrheumatic tricuspid valve disorder, unspecified: Secondary | ICD-10-CM

## 2013-09-20 DIAGNOSIS — I4891 Unspecified atrial fibrillation: Secondary | ICD-10-CM | POA: Diagnosis present

## 2013-09-20 DIAGNOSIS — I509 Heart failure, unspecified: Secondary | ICD-10-CM | POA: Diagnosis present

## 2013-09-20 DIAGNOSIS — Z951 Presence of aortocoronary bypass graft: Secondary | ICD-10-CM

## 2013-09-20 DIAGNOSIS — J42 Unspecified chronic bronchitis: Secondary | ICD-10-CM

## 2013-09-20 DIAGNOSIS — N183 Chronic kidney disease, stage 3 unspecified: Secondary | ICD-10-CM | POA: Diagnosis present

## 2013-09-20 DIAGNOSIS — E873 Alkalosis: Secondary | ICD-10-CM

## 2013-09-20 DIAGNOSIS — J9600 Acute respiratory failure, unspecified whether with hypoxia or hypercapnia: Secondary | ICD-10-CM

## 2013-09-20 DIAGNOSIS — J679 Hypersensitivity pneumonitis due to unspecified organic dust: Principal | ICD-10-CM | POA: Diagnosis present

## 2013-09-20 DIAGNOSIS — Z79899 Other long term (current) drug therapy: Secondary | ICD-10-CM

## 2013-09-20 DIAGNOSIS — J982 Interstitial emphysema: Secondary | ICD-10-CM

## 2013-09-20 DIAGNOSIS — E871 Hypo-osmolality and hyponatremia: Secondary | ICD-10-CM

## 2013-09-20 DIAGNOSIS — H1031 Unspecified acute conjunctivitis, right eye: Secondary | ICD-10-CM

## 2013-09-20 DIAGNOSIS — I340 Nonrheumatic mitral (valve) insufficiency: Secondary | ICD-10-CM

## 2013-09-20 DIAGNOSIS — Z681 Body mass index (BMI) 19 or less, adult: Secondary | ICD-10-CM

## 2013-09-20 DIAGNOSIS — R319 Hematuria, unspecified: Secondary | ICD-10-CM | POA: Diagnosis present

## 2013-09-20 DIAGNOSIS — E874 Mixed disorder of acid-base balance: Secondary | ICD-10-CM | POA: Diagnosis present

## 2013-09-20 DIAGNOSIS — R5381 Other malaise: Secondary | ICD-10-CM

## 2013-09-20 DIAGNOSIS — R351 Nocturia: Secondary | ICD-10-CM

## 2013-09-20 DIAGNOSIS — C8445 Peripheral T-cell lymphoma, not classified, lymph nodes of inguinal region and lower limb: Secondary | ICD-10-CM

## 2013-09-20 DIAGNOSIS — G9349 Other encephalopathy: Secondary | ICD-10-CM | POA: Diagnosis not present

## 2013-09-20 DIAGNOSIS — I498 Other specified cardiac arrhythmias: Secondary | ICD-10-CM | POA: Diagnosis present

## 2013-09-20 DIAGNOSIS — I2510 Atherosclerotic heart disease of native coronary artery without angina pectoris: Secondary | ICD-10-CM

## 2013-09-20 DIAGNOSIS — Z9861 Coronary angioplasty status: Secondary | ICD-10-CM

## 2013-09-20 DIAGNOSIS — T451X5A Adverse effect of antineoplastic and immunosuppressive drugs, initial encounter: Secondary | ICD-10-CM | POA: Diagnosis present

## 2013-09-20 DIAGNOSIS — IMO0002 Reserved for concepts with insufficient information to code with codable children: Secondary | ICD-10-CM

## 2013-09-20 DIAGNOSIS — J680 Bronchitis and pneumonitis due to chemicals, gases, fumes and vapors: Secondary | ICD-10-CM

## 2013-09-20 DIAGNOSIS — Z515 Encounter for palliative care: Secondary | ICD-10-CM

## 2013-09-20 DIAGNOSIS — R1031 Right lower quadrant pain: Secondary | ICD-10-CM

## 2013-09-20 DIAGNOSIS — J9620 Acute and chronic respiratory failure, unspecified whether with hypoxia or hypercapnia: Secondary | ICD-10-CM | POA: Diagnosis present

## 2013-09-20 DIAGNOSIS — E46 Unspecified protein-calorie malnutrition: Secondary | ICD-10-CM | POA: Diagnosis present

## 2013-09-20 DIAGNOSIS — C8590 Non-Hodgkin lymphoma, unspecified, unspecified site: Secondary | ICD-10-CM

## 2013-09-20 DIAGNOSIS — Z86718 Personal history of other venous thrombosis and embolism: Secondary | ICD-10-CM

## 2013-09-20 DIAGNOSIS — R7989 Other specified abnormal findings of blood chemistry: Secondary | ICD-10-CM

## 2013-09-20 DIAGNOSIS — J939 Pneumothorax, unspecified: Secondary | ICD-10-CM

## 2013-09-20 DIAGNOSIS — Z66 Do not resuscitate: Secondary | ICD-10-CM | POA: Diagnosis not present

## 2013-09-20 DIAGNOSIS — R931 Abnormal findings on diagnostic imaging of heart and coronary circulation: Secondary | ICD-10-CM

## 2013-09-20 DIAGNOSIS — I252 Old myocardial infarction: Secondary | ICD-10-CM

## 2013-09-20 DIAGNOSIS — I059 Rheumatic mitral valve disease, unspecified: Secondary | ICD-10-CM | POA: Diagnosis present

## 2013-09-20 HISTORY — DX: Bronchitis and pneumonitis due to chemicals, gases, fumes and vapors: J68.0

## 2013-09-20 LAB — BLOOD GAS, ARTERIAL
Acid-Base Excess: 14.5 mmol/L — ABNORMAL HIGH (ref 0.0–2.0)
BICARBONATE: 38.5 meq/L — AB (ref 20.0–24.0)
Drawn by: 331471
O2 Content: 4 L/min
O2 SAT: 96.1 %
PATIENT TEMPERATURE: 98.6
PH ART: 7.539 — AB (ref 7.350–7.450)
TCO2: 39.9 mmol/L (ref 0–100)
pCO2 arterial: 45.3 mmHg — ABNORMAL HIGH (ref 35.0–45.0)
pO2, Arterial: 60.6 mmHg — ABNORMAL LOW (ref 80.0–100.0)

## 2013-09-20 LAB — URINE MICROSCOPIC-ADD ON

## 2013-09-20 LAB — URINALYSIS, ROUTINE W REFLEX MICROSCOPIC
Bilirubin Urine: NEGATIVE
GLUCOSE, UA: NEGATIVE mg/dL
KETONES UR: NEGATIVE mg/dL
Nitrite: NEGATIVE
PROTEIN: NEGATIVE mg/dL
Specific Gravity, Urine: 1.012 (ref 1.005–1.030)
UROBILINOGEN UA: 0.2 mg/dL (ref 0.0–1.0)
pH: 8 (ref 5.0–8.0)

## 2013-09-20 LAB — CBC
HEMATOCRIT: 32.6 % — AB (ref 39.0–52.0)
HEMOGLOBIN: 11 g/dL — AB (ref 13.0–17.0)
MCH: 32.6 pg (ref 26.0–34.0)
MCHC: 33.7 g/dL (ref 30.0–36.0)
MCV: 96.7 fL (ref 78.0–100.0)
Platelets: 186 10*3/uL (ref 150–400)
RBC: 3.37 MIL/uL — AB (ref 4.22–5.81)
RDW: 19.9 % — AB (ref 11.5–15.5)
WBC: 19.7 10*3/uL — AB (ref 4.0–10.5)

## 2013-09-20 LAB — PROCALCITONIN: Procalcitonin: 0.2 ng/mL

## 2013-09-20 LAB — BASIC METABOLIC PANEL
BUN: 28 mg/dL — ABNORMAL HIGH (ref 6–23)
CALCIUM: 8.9 mg/dL (ref 8.4–10.5)
CO2: 37 mEq/L — ABNORMAL HIGH (ref 19–32)
Chloride: 90 mEq/L — ABNORMAL LOW (ref 96–112)
Creatinine, Ser: 1.3 mg/dL (ref 0.50–1.35)
GFR, EST AFRICAN AMERICAN: 62 mL/min — AB (ref >90)
GFR, EST NON AFRICAN AMERICAN: 54 mL/min — AB (ref >90)
GLUCOSE: 97 mg/dL (ref 70–99)
POTASSIUM: 3.6 meq/L — AB (ref 3.7–5.3)
Sodium: 135 mEq/L — ABNORMAL LOW (ref 137–147)

## 2013-09-20 LAB — PRO B NATRIURETIC PEPTIDE: Pro B Natriuretic peptide (BNP): 4148 pg/mL — ABNORMAL HIGH (ref 0–125)

## 2013-09-20 LAB — MRSA PCR SCREENING: MRSA BY PCR: NEGATIVE

## 2013-09-20 LAB — TROPONIN I

## 2013-09-20 LAB — LACTIC ACID, PLASMA: Lactic Acid, Venous: 1.5 mmol/L (ref 0.5–2.2)

## 2013-09-20 MED ORDER — FUROSEMIDE 10 MG/ML IJ SOLN: 20.0000 mg | Freq: Once | INTRAMUSCULAR | Status: DC

## 2013-09-20 MED ORDER — VALACYCLOVIR HCL 500 MG PO TABS
500.0000 mg | ORAL_TABLET | Freq: Every day | ORAL | Status: DC
  Administered 2013-09-20 – 2013-09-24 (×5): 500 mg via ORAL
  Filled 2013-09-20 (×6): qty 1

## 2013-09-20 MED ORDER — METOPROLOL TARTRATE 1 MG/ML IV SOLN
2.5000 mg | INTRAVENOUS | Status: DC | PRN
  Administered 2013-09-20 (×2): 2.5 mg via INTRAVENOUS
  Administered 2013-09-21 – 2013-09-24 (×3): 5 mg via INTRAVENOUS
  Filled 2013-09-20 (×3): qty 5

## 2013-09-20 MED ORDER — DEXTROSE 5 % IV SOLN
1.0000 g | Freq: Two times a day (BID) | INTRAVENOUS | Status: DC
  Filled 2013-09-20 (×2): qty 1

## 2013-09-20 MED ORDER — SORBITOL 70 % SOLN
30.0000 mL | Status: AC
  Administered 2013-09-20: 30 mL via ORAL
  Filled 2013-09-20: qty 30

## 2013-09-20 MED ORDER — SENNOSIDES-DOCUSATE SODIUM 8.6-50 MG PO TABS: 1.0000 | ORAL_TABLET | Freq: Every evening | ORAL | Status: DC | PRN

## 2013-09-20 MED ORDER — SODIUM CHLORIDE 0.9 % IJ SOLN: 3.0000 mL | INTRAMUSCULAR | Status: DC | PRN

## 2013-09-20 MED ORDER — METOPROLOL TARTRATE 1 MG/ML IV SOLN
INTRAVENOUS | Status: AC
  Filled 2013-09-20: qty 5

## 2013-09-20 MED ORDER — HEPARIN SODIUM (PORCINE) 5000 UNIT/ML IJ SOLN
5000.0000 [IU] | Freq: Three times a day (TID) | INTRAMUSCULAR | Status: DC
  Filled 2013-09-20 (×3): qty 1

## 2013-09-20 MED ORDER — FUROSEMIDE 10 MG/ML IJ SOLN
INTRAMUSCULAR | Status: AC
  Filled 2013-09-20: qty 4

## 2013-09-20 MED ORDER — PANTOPRAZOLE SODIUM 40 MG IV SOLR
40.0000 mg | Freq: Every day | INTRAVENOUS | Status: DC
  Filled 2013-09-20: qty 40

## 2013-09-20 MED ORDER — FUROSEMIDE NICU IV SYRINGE 10 MG/ML: 20.0000 mg | Freq: Once | INTRAMUSCULAR | Status: DC

## 2013-09-20 MED ORDER — ATORVASTATIN CALCIUM 20 MG PO TABS
20.0000 mg | ORAL_TABLET | Freq: Every day | ORAL | Status: DC
  Administered 2013-09-20 – 2013-09-23 (×4): 20 mg via ORAL
  Filled 2013-09-20 (×6): qty 1

## 2013-09-20 MED ORDER — PANTOPRAZOLE SODIUM 40 MG IV SOLR
40.0000 mg | Freq: Every day | INTRAVENOUS | Status: DC
Start: 2013-09-20 — End: 2013-09-21
  Administered 2013-09-20: 40 mg via INTRAVENOUS
  Filled 2013-09-20 (×2): qty 40

## 2013-09-20 MED ORDER — FUROSEMIDE 10 MG/ML IJ SOLN
20.0000 mg | Freq: Once | INTRAMUSCULAR | Status: AC
  Administered 2013-09-20: 20 mg via INTRAVENOUS
  Filled 2013-09-20: qty 2

## 2013-09-20 MED ORDER — PREDNISONE 50 MG PO TABS
60.0000 mg | ORAL_TABLET | Freq: Every day | ORAL | Status: DC
  Administered 2013-09-21 – 2013-09-24 (×4): 60 mg via ORAL
  Filled 2013-09-20 (×6): qty 1

## 2013-09-20 MED ORDER — OMEGA-3-ACID ETHYL ESTERS 1 G PO CAPS
1.0000 g | ORAL_CAPSULE | Freq: Every day | ORAL | Status: DC
  Administered 2013-09-21: 1 g via ORAL
  Filled 2013-09-20 (×2): qty 1

## 2013-09-20 MED ORDER — ACETAZOLAMIDE SODIUM 500 MG IJ SOLR
250.0000 mg | Freq: Four times a day (QID) | INTRAMUSCULAR | Status: AC
  Administered 2013-09-20 – 2013-09-21 (×3): 250 mg via INTRAVENOUS
  Filled 2013-09-20 (×3): qty 500

## 2013-09-20 MED ORDER — ASPIRIN EC 81 MG PO TBEC
81.0000 mg | DELAYED_RELEASE_TABLET | Freq: Every evening | ORAL | Status: DC
  Administered 2013-09-20 – 2013-09-23 (×4): 81 mg via ORAL
  Filled 2013-09-20 (×5): qty 1

## 2013-09-20 MED ORDER — SODIUM CHLORIDE 0.9 % IV SOLN: 250.0000 mL | INTRAVENOUS | Status: DC | PRN

## 2013-09-20 MED ORDER — SODIUM CHLORIDE 0.9 % IJ SOLN
3.0000 mL | Freq: Two times a day (BID) | INTRAMUSCULAR | Status: DC
  Administered 2013-09-20 – 2013-09-22 (×3): 3 mL via INTRAVENOUS
  Administered 2013-09-22: 10:00:00 via INTRAVENOUS
  Administered 2013-09-23: 3 mL via INTRAVENOUS
  Administered 2013-09-25: 11:00:00 via INTRAVENOUS

## 2013-09-20 MED ORDER — VANCOMYCIN HCL 500 MG IV SOLR
500.0000 mg | Freq: Two times a day (BID) | INTRAVENOUS | Status: DC
  Filled 2013-09-20 (×2): qty 500

## 2013-09-20 MED ORDER — FUROSEMIDE 10 MG/ML IJ SOLN
20.0000 mg | Freq: Four times a day (QID) | INTRAMUSCULAR | Status: AC
  Administered 2013-09-20 – 2013-09-21 (×3): 20 mg via INTRAVENOUS
  Filled 2013-09-20: qty 2

## 2013-09-20 MED ORDER — PREDNISONE 20 MG PO TABS
40.0000 mg | ORAL_TABLET | Freq: Every day | ORAL | Status: AC
  Administered 2013-09-20: 40 mg via ORAL
  Filled 2013-09-20: qty 2

## 2013-09-20 MED ORDER — ALBUTEROL SULFATE (2.5 MG/3ML) 0.083% IN NEBU: 2.5000 mg | INHALATION_SOLUTION | RESPIRATORY_TRACT | Status: DC | PRN

## 2013-09-20 MED ORDER — PRASUGREL HCL 10 MG PO TABS
10.0000 mg | ORAL_TABLET | Freq: Every evening | ORAL | Status: DC
  Administered 2013-09-20 – 2013-09-24 (×5): 10 mg via ORAL
  Filled 2013-09-20 (×7): qty 1

## 2013-09-20 MED ORDER — BISACODYL 10 MG RE SUPP: 10.0000 mg | Freq: Once | RECTAL | Status: DC

## 2013-09-20 MED ORDER — PREDNISONE 20 MG PO TABS
30.0000 mg | ORAL_TABLET | Freq: Every day | ORAL | Status: DC
  Filled 2013-09-20: qty 1

## 2013-09-20 MED ORDER — SODIUM CHLORIDE 0.9 % IV SOLN
INTRAVENOUS | Status: DC
  Administered 2013-09-20: 15:00:00 via INTRAVENOUS
  Administered 2013-09-22: 10 mL via INTRAVENOUS

## 2013-09-20 NOTE — Progress Notes (Signed)
Social Work Patient ID: Willie [name], male   DOB: 16-Mar-1941, 73 y.o.   MRN: [id number]  Met yesterday with pt and wife to review team conference information.  Also contacted daughter via phone to review.  All aware of targeted d/c date of 6/9 and of downgraded goals of minimal assistance overall.  Discussed pt's continued medical instability and decreased endurance to tolerate a full day of tx.  Therapy schedule decreased to 15/7.  Pt sleeping throughout my discussion with wife.  Would occasional startle awake, however, unable to stay attentive.  Wife agreeable with target date and of altered goals.  She does state, "I just wish he would get back to where he was.  Able to get himself back and forth to the bathroom.  I guess we'll have to make some changes."  Wife wanting to speak with pt and daughter further about home plans.  I did present to her the option of SNF if she does not feel she can meet care needs.  Plan was to follow up with her end of week about this.    Pt now declining medically and decision has been made to transfer pt to acute care.  Have alerted insurance CM about transfer.  Will discuss d/c plan status with ACs to assist with decisions about return to CIR.    Lennart Pall, LCSW

## 2013-09-20 NOTE — Progress Notes (Signed)
Patient's HR sustaining in 120's at rest.  eLink MD notified and new orders received.  Will continue to monitor. Crowder

## 2013-09-20 NOTE — Progress Notes (Signed)
[place] PHYSICAL MEDICINE & REHABILITATION     PROGRESS NOTE    Subjective/Complaints: Had a fair night again. Breathing "ok". Has appetite. Struggled with therapies yesterday    review of systems has been performed and if not noted above is otherwise negative.   Objective: Vital Signs: Blood pressure 147/85, pulse 92, temperature 97.4 F (36.3 C), temperature source Oral, resp. rate 20, weight 61.9 kg (136 lb 7.4 oz), SpO2 99.00%. No results found.  Recent Labs  09/20/13 0513  WBC 19.7*  HGB 11.0*  HCT 32.6*  PLT 186    Recent Labs  09/18/13 0600 09/20/13 0513  NA 135* 135*  K 3.5* 3.6*  CL 89* 90*  GLUCOSE 107* 97  BUN 35* 28*  CREATININE 1.55* 1.30  CALCIUM 9.4 8.9   CBG (last 3)  No results found for this basename: GLUCAP,  in the last 72 hours  Wt Readings from Last 3 Encounters:  09/20/13 61.9 kg (136 lb 7.4 oz)  09/10/13 60.782 kg (134 lb)  08/30/13 60.51 kg (133 lb 6.4 oz)    Physical Exam:   Constitutional: He is oriented to person, place, and time.  73 year old frail Caucasian male  HENT: mucosa pink/moist, tongue a little dry  Head: Normocephalic.  Eyes: EOM are normal.  Neck: Normal range of motion. Neck supple. No thyromegaly present.  Cardiovascular: Normal rate and regular rhythm. Systolic murmur  Respiratory:  Breathing more labored. Still uses accessory muscles to breathe GI: Soft. Bowel sounds are normal. He exhibits no distension.  Neurological: He is alert and oriented to person, place, and time.  motor strength is 4+/5 bilateral deltoid, bicep, tricep, grip, hip flexors, knee extensors, ankle dorsiflexion plantar flexion  Sensation is intact to light touch and proprioception bilateral upper and lower limbs  Patient moves with delay, needs repeated cueing at times.  Psych: affect flat, cooperative.     Assessment/Plan: 1. Functional deficits secondary to deconditioning after pneumonia/respiratory failure which require 3+  hours per day of interdisciplinary therapy in a comprehensive inpatient rehab setting. Physiatrist is providing close team supervision and 24 hour management of active medical problems listed below. Physiatrist and rehab team continue to assess barriers to discharge/monitor patient progress toward functional and medical goals.  Therapy reduced to 15 hours over 7 days   FIM: FIM - Bathing Bathing Steps Patient Completed: Chest;Right Arm;Left Arm;Abdomen;Front perineal area;Right upper leg;Left upper leg;Right lower leg (including foot);Left lower leg (including foot) Bathing: 4: Min-Patient completes 8-9 36f 10 parts or 75+ percent  FIM - Upper Body Dressing/Undressing Upper body dressing/undressing steps patient completed: Thread/unthread right sleeve of pullover shirt/dresss;Thread/unthread left sleeve of pullover shirt/dress;Put head through opening of pull over shirt/dress;Pull shirt over trunk Upper body dressing/undressing: 5: Set-up assist to: Obtain clothing/put away FIM - Lower Body Dressing/Undressing Lower body dressing/undressing steps patient completed: Thread/unthread right underwear leg;Thread/unthread right pants leg;Thread/unthread left pants leg;Don/Doff right sock;Don/Doff left sock Lower body dressing/undressing: 2: Max-Patient completed 25-49% of tasks  FIM - Toileting Toileting steps completed by patient: Performs perineal hygiene;Adjust clothing prior to toileting Toileting Assistive Devices: Grab bar or rail for support Toileting: 0: Activity did not occur  FIM - Radio producer Devices: Grab bars Toilet Transfers: 0-Activity did not occur  FIM - Control and instrumentation engineer Devices: Bed rails;Arm rests Bed/Chair Transfer: 0: Activity did not occur  FIM - Locomotion: Wheelchair Distance: 60 Locomotion: Wheelchair: 1: Travels less than 50 ft with moderate assistance (Pt: 50 - 74%)  FIM - Locomotion:  Ambulation Locomotion: Ambulation Assistive Devices: Administrator Ambulation/Gait Assistance: 4: Min guard Locomotion: Ambulation: 0: Activity did not occur  Comprehension Comprehension Mode: Auditory Comprehension: 5-Understands complex 90% of the time/Cues < 10% of the time  Expression Expression Mode: Verbal Expression: 3-Expresses basic 50 - 74% of the time/requires cueing 25 - 50% of the time. Needs to repeat parts of sentences.  Social Interaction Social Interaction: 5-Interacts appropriately 90% of the time - Needs monitoring or encouragement for participation or interaction.  Problem Solving Problem Solving: 3-Solves basic 50 - 74% of the time/requires cueing 25 - 49% of the time  Memory Memory: 3-Recognizes or recalls 50 - 74% of the time/requires cueing 25 - 49% of the time Medical Problem List and Plan:  1. Functional deficits secondary to deconditioning after pneumonia, respiratory failure with history of non-Hodgkin's lymphoma  2. DVT Prophylaxis/Anticoagulation: off lovenox due to hematuria   3. Pain Management: Hydrocodone as needed. Monitor with increased mobility  4. Mood: Bouts of confusion likely related to multiple medical issues 5. Neuropsych: This patient is not capable of making decisions on his own behalf.  6. Non-Hodgkin's lymphoma. Followup per Dr.Gorsuch. 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 Chemotherapy program to be reevaluated as outpatient    7. Hypertension. Lopressor 25 mg daily. Monitor with increased mobility  8. CAD with CABG/fluid overload/a flutter.   Continue Effient 10 mg daily as well as aspirin   -rate controlled. Breathing better.   -appreciate cards help  -up as tolerated today  -daily lasix. Will give 20mg  iv lasix today (weight is up again today, and he's struggling a bit more to breathe)  -I would also like for pulmonary/CCS to provide some guidance regarding his pulmonary issues to see if we  can optimize his breathing/respiratory tolerance. Recheck cxr  -keep foley 9. Hyperlipidemia. Lipitor  10. Chronic renal insufficiency. Baseline CR around 1.5 which we are near now. 11. Hyponatremia resolved 12.  Hematuria improved off of lovenox 13.  Hypokalemia: continue repletion 14. Anemia: s/p blood transfusion:   -urine clear  -still don't have a stool specimen for ob. 15. Constipation: he needs to have bm!!!  -sorbitol today.   -dulcolax suppository  LOS (Days) 8 A FACE TO FACE EVALUATION WAS PERFORMED  Meredith Staggers 09/20/2013 8:04 AM

## 2013-09-20 NOTE — Progress Notes (Signed)
Subjective: Pt is more short of breath today with increased work of breathing and use of accessory muscles. Supplemental O2 requirements have increased from 2 to 4 L/min. Pt has been seen by Critical Care and plans are in place to admit to hospital floor.    Objective: Vital signs in last 24 hours: Temp:  [97.4 F (36.3 C)-98.5 F (36.9 C)] 98.5 F (36.9 C) (06/03 1039) Pulse Rate:  [92-109] 108 (06/03 1100) Resp:  [20-22] 20 (06/03 1100) BP: (100-147)/(55-85) 119/64 mmHg (06/03 1100) SpO2:  [94 %-99 %] 95 % (06/03 1100) Weight:  [136 lb 7.4 oz (61.9 kg)] 136 lb 7.4 oz (61.9 kg) (06/03 0552) Last BM Date: 09/15/13 (Per wife report)  Intake/Output from previous day: 06/02 0701 - 06/03 0700 In: 960 [P.O.:960] Out: 2200 [Urine:2200] Intake/Output this shift: Total I/O In: 240 [P.O.:240] Out: 1000 [Urine:1000]  Medications Current Facility-Administered Medications  Medication Dose Route Frequency Provider Last Rate Last Dose  . 0.9 %  sodium chloride infusion  250 mL Intravenous PRN Corey Harold, NP      . acetaminophen (TYLENOL) tablet 325-650 mg  325-650 mg Oral Q4H PRN Lavon Paganini Angiulli, PA-C      . albuterol (PROVENTIL) (2.5 MG/3ML) 0.083% nebulizer solution 2.5 mg  2.5 mg Nebulization Q2H PRN Lavon Paganini Angiulli, PA-C      . antiseptic oral rinse (BIOTENE) solution 15 mL  15 mL Mouth Rinse PRN Meredith Staggers, MD      . aspirin EC tablet 81 mg  81 mg Oral QPM Lavon Paganini Angiulli, PA-C   81 mg at 09/19/13 1802  . atorvastatin (LIPITOR) tablet 20 mg  20 mg Oral Once per day on Sun Wed Lavon Paganini Angiulli, PA-C   20 mg at 09/17/13 1725  . bisacodyl (DULCOLAX) suppository 10 mg  10 mg Rectal Once Meredith Staggers, MD      . cholecalciferol (VITAMIN D) tablet 1,000 Units  1,000 Units Oral Daily Lavon Paganini Angiulli, PA-C   1,000 Units at 09/20/13 1059  . cyanocobalamin tablet 500 mcg  500 mcg Oral Daily Lavon Paganini Angiulli, PA-C   500 mcg at 09/20/13 1100  . docusate sodium (COLACE)  capsule 100 mg  100 mg Oral BID Lavon Paganini Angiulli, PA-C   100 mg at 09/20/13 1059  . furosemide (LASIX) tablet 20 mg  20 mg Oral Daily Darlin Coco, MD   20 mg at 09/20/13 0830  . HYDROcodone-acetaminophen (NORCO/VICODIN) 5-325 MG per tablet 1-2 tablet  1-2 tablet Oral Q4H PRN Lavon Paganini Angiulli, PA-C      . magic mouthwash  5 mL Oral TID Meredith Staggers, MD   5 mL at 09/20/13 0830  . megestrol (MEGACE) 400 MG/10ML suspension 200 mg  200 mg Oral BID Charlett Blake, MD   200 mg at 09/20/13 1093  . metoprolol tartrate (LOPRESSOR) tablet 25 mg  25 mg Oral q morning - 10a Daniel J Angiulli, PA-C   25 mg at 09/20/13 1100  . multivitamin with minerals tablet 1 tablet  1 tablet Oral Daily Lavon Paganini Angiulli, PA-C   1 tablet at 09/20/13 0830  . nitroGLYCERIN (NITROSTAT) SL tablet 0.4 mg  0.4 mg Sublingual Q5 min PRN Lavon Paganini Angiulli, PA-C      . ondansetron [facility]) tablet 4 mg  4 mg Oral Q6H PRN Lavon Paganini Angiulli, PA-C       Or  . ondansetron (ZOFRAN) injection 4 mg  4 mg Intravenous Q6H PRN Quillian Quince  J Angiulli, PA-C      . oxymetazoline (AFRIN) 0.05 % nasal spray 1 spray  1 spray Each Nare BID Lavon Paganini Angiulli, PA-C   1 spray at 09/19/13 0949  . potassium chloride (K-DUR,KLOR-CON) CR tablet 10 mEq  10 mEq Oral Daily Charlett Blake, MD   10 mEq at 09/20/13 0830  . prasugrel (EFFIENT) tablet 10 mg  10 mg Oral QPM Lavon Paganini Angiulli, PA-C   10 mg at 09/19/13 1802  . [START ON 09/21/2013] predniSONE (DELTASONE) tablet 30 mg  30 mg Oral Q breakfast Meredith Staggers, MD      . senna-docusate (Senokot-S) tablet 1 tablet  1 tablet Oral QHS PRN Cathlyn Parsons, PA-C   1 tablet at 09/18/13 2033  . sodium chloride (OCEAN) 0.65 % nasal spray 1 spray  1 spray Each Nare Q2H Meredith Staggers, MD   1 spray at 09/20/13 0601  . sodium chloride tablet 1 g  1 g Oral TID WC Daniel J Angiulli, PA-C   1 g at 09/20/13 1100  . sorbitol 70 % solution 30 mL  30 mL Oral Daily PRN Lavon Paganini Angiulli, PA-C   30 mL at  09/19/13 1915  . valACYclovir (VALTREX) tablet 500 mg  500 mg Oral Daily Lavon Paganini Angiulli, PA-C   500 mg at 09/20/13 0830    PE: General appearance: alert, cooperative, mild distress and pale Neck: assessory muscle usage Lungs: bibasilar rales Heart: tachy and irregular, 4/6 murmur best heard near apex Abdomen: + use of abdominal muslces for breathing Extremities: no LEE Pulses: 2+ and symmetric Skin: warm and dry Neurologic: Grossly normal  Lab Results:   Recent Labs  09/20/13 0513  WBC 19.7*  HGB 11.0*  HCT 32.6*  PLT 186   BMET  Recent Labs  09/18/13 0600 09/20/13 0513  NA 135* 135*  K 3.5* 3.6*  CL 89* 90*  CO2 36* 37*  GLUCOSE 107* 97  BUN 35* 28*  CREATININE 1.55* 1.30  CALCIUM 9.4 8.9     Assessment/Plan    Active Problems:   Physical deconditioning   Acute respiratory failure  1. Acute Respiratory Failure: increased work of breathing with use of accessory muscles. Supplemental O2 increased from 2 to 4 L/min. Pulmonary/ Critical Care by beside with plans to admit to hospital floor. His EKG shows sinus tach with PVCs. HR in the 130s. EKG also suggest RV hypertrophy. ? PE. 2D echo has been ordered as well as bilateral lower extremity doppler studies. He is hemodynamically stable. BP is 119/64.   2. Paroxsymal Aflutter:  Sinus tachy with PAC. HR 133. He will be transferred to hospital floor. Recommend continuous telemetry monitoring. Continue BB    LOS: 8 days    Brittainy M. Ladoris Gene 09/20/2013 12:13 PM  Personally seen and examined. Agree with above. -Trying IV lasix (previously helped with dyspnea) -CXR bilateral edema pattern, actually improved, ?pneumonitis -BP soft -AFlutter reasonable control here in unit. Was prior sinus tach.  Candee Furbish, MD

## 2013-09-20 NOTE — Progress Notes (Signed)
eLink Physician-Brief Progress Note Patient Name: Willie [name] DOB: 12-15-1940 MRN: [id number]  Date of Service  09/20/2013   HPI/Events of Note   AFRVR with rates into mid 120s  eICU Interventions  PRN metoprolol to maintain HR < 110/min   Intervention Category Intermediate Interventions: Arrhythmia - evaluation and management  Wilhelmina Mcardle 09/20/2013, 6:48 PM

## 2013-09-20 NOTE — Progress Notes (Signed)
Occupational Therapy Session and Cancellation Note  Patient Details  Name: Willie [name] MRN: [id number] Date of Birth: [birth year]  Today's Date: 09/20/2013 Time: 1030-1045 Time Calculation (min): 15 min  (missed 30 min due to patient down for chest x-ray then Pulmonary ordered STAT EKG)  Short Term Goals: Week 1:  OT Short Term Goal 1 (Week 1): STGs = LTGs due to short LOS  Skilled Therapeutic Interventions/Progress Updates:  Patient resting in bed upon arrival with wife at his side and Pulmonary NP in room.  Assisted with pursed lip breathing, education with patient and wife.  RN suggested to discontinue this session.  Therapy Documentation Precautions:  Precautions Precautions: Fall Precaution Comments: Respiratory Sats Restrictions Weight Bearing Restrictions: No  Gaye Pollack 09/20/2013, 11:18 AM

## 2013-09-20 NOTE — Discharge Summary (Signed)
Discharge summary job # [id number]

## 2013-09-20 NOTE — Progress Notes (Signed)
Physical Therapy Weekly Progress Note  Patient Details  Name: Willie [name] MRN: [id number] Date of Birth: 06/26/1940  Beginning of progress report period: Sep 13, 2013 End of progress report period: September 20, 2013  Today's Date: 09/20/2013  Patient has met 0 of 9 long term goals.  Short term goals not set due to estimated length of stay.  Pt with decline in function, decreased participation in therapy due to pt refusal, decreased spO2, increased HR.  Pt's goals downgraded, treatment team made aware.  Patient continues to demonstrate the following deficits: impaired strength, activity tolerance, gait and balance and therefore will continue to benefit from skilled PT intervention to enhance overall performance with activity tolerance, balance, ability to compensate for deficits and awareness.  See Patient's Care Plan for progression toward long term goals.  Patient not progressing toward long term goals.  See goal revision..  Plan of care revisions: 15 hours over 7 days.  Skilled Therapeutic Interventions/Progress Updates:  Ambulation/gait training;Balance/vestibular training;Community reintegration;Discharge planning;Neuromuscular re-education;Functional mobility training;DME/adaptive equipment instruction;Disease management/prevention;Pain management;Stair training;Wheelchair propulsion/positioning;Therapeutic Activities;Patient/family education;Psychosocial support;Therapeutic Exercise;UE/LE Strength taining/ROM;UE/LE Coordination activities    See FIM for current functional status    Karen K Donawerth 09/20/2013, 8:26 AM   

## 2013-09-20 NOTE — Patient Care Conference (Signed)
Inpatient RehabilitationTeam Conference and Plan of Care Update Date: 09/19/2013   Time: 2:30  PM    Patient Name: Willie [name]      Medical Record Number: [id number]  Date of Birth: October 11, 1940 Sex: Male         Room/Bed: 4M07C/4M07C-01 Payor Info: Payor: [employer] [place] MEDICARE / Plan: BLUE MEDICARE / Product Type: *No Product type* /    Admitting Diagnosis: DECONDITIONED PNA  Admit Date/Time:  09/12/2013  5:39 PM Admission Comments: No comment available   Primary Diagnosis:  <principal problem not specified> Principal Problem: <principal problem not specified>  Patient Active Problem List   Diagnosis Date Noted  . Acute respiratory failure 09/17/2013  . Physical deconditioning 09/12/2013  . Hyponatremia 09/04/2013  . Pneumomediastinum 09/04/2013  . Pneumothorax on right 09/04/2013  . Pneumonia 09/04/2013  . Lymphoma 09/04/2013  . Conjunctivitis, acute, right eye 06/28/2013  . Bronchitis, chronic 06/28/2013  . Deep inguinal pain, right 05/26/2013  . CAD (coronary artery disease) 10/10/2012  . Abnormal nuclear cardiac imaging test 10/02/2012  . RBBB   . Hypertension   . Severe mitral regurgitation 09/08/2012  . Unexplained weight loss 05/03/2012  . Chronic renal insufficiency, stage III (moderate) 11/10/2011  . 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 History of DVT of lower extremity 11/10/2011  . Low serum testosterone level 11/10/2011  . 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 Nocturia 11/10/2011  . Normochromic Anemia 11/10/2011  . 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 Alger Simons Social Worker Present: Lennart Pall, LCSW Nurse Present: Elliot Cousin, RN PT Present: Canary Brim, PT OT Present: Blanchard Mane, OT;Jennifer Tamala Julian, Stewart, OT SLP Present: Weston Anna, SLP PPS Coordinator  present : Daiva Nakayama, RN, CRRN;Becky Alwyn Ren, PT     Current Status/Progress Goal Weekly Team Focus  Medical   fluid overload, renal failure. still with activity tolernce issues  increase activity tolerance  maximizing volume, cv status, improve nutrition   Bowel/Bladder   Continent of bowel and bladder. LBM 09/13/13  Pt to remain continent of bowel and bladder.  Regular bowel pattern q 2days. Educate regarding bowel aid   Swallow/Nutrition/ Hydration   reg/thin, no straws, intermittent cuing for oral holding with fatigue  mod I with least restrictive diet   ongoing assessment for diet tolerance given pt's fluctuating mentation and arousal   ADL's   Overall Min assist except Max assist with LB dressing and toileting  All initial LTGs downgraded to overall Min assist and tub/shower transfers have been discharged.  activity tolerance, participation in therapy, standing balance, patient/caregiver education, discharge planning   Mobility   total A  supervision  pt participation, d/c planning   Communication   n/a  n/a  n/a   Safety/Cognition/ Behavioral Observations  min-mod A  min-supervision   carryover for compensatory strategies for memory and problem solving   Pain   No c/o pain  <3  Assess for nonverbal cues of pain   Skin   CDI  CDI  Assess q shift. Routine turn q 2hrs    Rehab Goals Patient on target to meet rehab goals: Yes *See Care Plan and progress notes for long and short-term goals.  Barriers to Discharge: fatigue, cognitive deficits    Possible Resolutions to Barriers:  treatmedical issues. adaptive equipment    Discharge Planning/Teaching Needs:  home with wife  to provide 24/7 assistance      Team Discussion:  Multiple medical issues.  Daughter expressing concerns and these are being addressed by Rehab Director and MD.  Question whether CIR is appropriate level of care for pt at this time.  Poor nutrition.  Downgrading goals to overall minimal assistance.  Poor  cognition and memory.  Likely that swallowing issues more related to fatigue levels.  Requiring max cues for pursed lip breathing.    Revisions to Treatment Plan:  Changing schedule to 15/7 and downgrading of overall goals to min assist.   Continued Need for Acute Rehabilitation Level of Care: The patient requires daily medical management by a physician with specialized training in physical medicine and rehabilitation for the following conditions: Daily direction of a multidisciplinary physical rehabilitation program to ensure safe treatment while eliciting the highest outcome that is of practical value to the patient.: Yes Daily medical management of patient stability for increased activity during participation in an intensive rehabilitation regime.: Yes Daily analysis of laboratory values and/or radiology reports with any subsequent need for medication adjustment of medical intervention for : Post surgical problems;Neurological problems;Pulmonary problems  Lennart Pall 09/20/2013, 12:08 PM

## 2013-09-20 NOTE — Progress Notes (Signed)
Name: Willie [name] MRN: [id number] DOB: 15-Jun-1940    ADMISSION DATE:  09/12/2013 CONSULTATION DATE:  09/04/2013  REFERRING MD :  Franchot Gallo PRIMARY SERVICE: PCCM  CHIEF COMPLAINT:  Dyspnea  BRIEF PATIENT DESCRIPTION: 73 y.o. w PMH of NHL, on chemo, presented 5/18 with severe SOB.  In ED, found to have small 5% right PTX, pneumomediastinum, dependent GGO's.  PCCM was consulted, conservative management recommended. Patient now on inpatient rehabilitation. Pulmonary consult called again for respiratory distress and hypoxemia. Chest X ray compatible with pulmonary edema. Patient improved with diuresis. 6/3 PCCM reconsult for SOB.    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 Developed respiratory distress and hypoxemia. Improved with diuresis.  09/20/13 - increased SOB, PCCM called. Transfer to ICU.  LINES / TUBES: PIV  CULTURES: Blood 5/18 >>> No growth Urine 5/29 > neg Blood 6/3 >>> Urine 6/3 >>> Sputum 6/3 >>>   ANTIBIOTICS: Valtrex 5/19 >>> Vanc 6/3 >>> Cefepime 6/3 >>>   SUBJECTIVE:  Increased SOB, not improved with additional IV lasix this AM. Subjective fevers. Denies cough/sputum production, and peripheral edema.   VITAL SIGNS:  Intake/Output Summary (Last 24 hours) at 09/20/13 1148 Last data filed at 09/20/13 1045  Gross per 24 hour  Intake    600 ml  Output   2500 ml  Net  -1900 ml    Temp:  [97.4 F (36.3 C)-98.5 F (36.9 C)] 98.5 F (36.9 C) (06/03 1039) Pulse Rate:  [92-109] 108 (06/03 1100) Resp:  [20-22] 20 (06/03 1100) BP: (100-147)/(55-85) 119/64 mmHg (06/03  1100) SpO2:  [94 %-99 %] 95 % (06/03 1100) Weight:  [61.9 kg (136 lb 7.4 oz)] 61.9 kg (136 lb 7.4 oz) (06/03 0552)  PHYSICAL EXAMINATION: General: elderly male in mild distress Neuro: Drowsy, alert to voice, oriented.  HEENT: [place]/AT. PERRL Cardiovascular: Tachy, 3/6 PSM.at apex Lungs: Respirations even and labored, paradoxical. .  Coarse crackles bibasilar Abdomen: BS x 4, soft, NT/ND.  Musculoskeletal: No gross deformities, 1+ pitting bilateral lower ext edema.  Skin: Intact, warm, no rashes.   Recent Labs Lab 09/17/13 1139 09/18/13 0600 09/20/13 0513  NA 135* 135* 135*  K 3.2* 3.5* 3.6*  CL 89* 89* 90*  CO2 39* 36* 37*  BUN 43* 35* 28*  CREATININE 1.86* 1.55* 1.30  GLUCOSE 106* 107* 97    Recent Labs Lab 09/15/13 0907 09/16/13 0702 09/17/13 0114 09/20/13 0513  HGB 8.2* 7.2* 10.4* 11.0*  HCT 23.0* 20.2* 30.2* 32.6*  WBC 21.4* 16.6*  --  19.7*  PLT 212 170  --  186   No results found.    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 Lit review does show a 1-10% incidence of pneumonitis with brentuximab (anti CD 30 agent) Small right apical PTX ,Pneumomediastinum -cause of this   Unclear- resolved Respiratory alkalosis  P:   Admit to ICU from CIR BiPAP STAT for SpO2 Goal > 92% May require intubation if MS declines to not support BiPAP STAT ABG Follow  CXR PRN BD's Doppler BLE  CARDIOVASCULAR A:  PAF currently sinus tach CAD On effient MR  P:  Poor candidate for anticoagulation per cards.  Lasix $Remo'20mg'YJTDW$  IV now Check lactic acid 2D echo  RENAL A:   CKD - creatinine normalizing Metabolic Alkalosis  P:  Monitor BMP Saline Lock Hematuria  GASTROINTESTINAL A:   GI PPX  P:   PPI NPO except for meds if able to remain un-intubated.   HEMATOLOGIC A:   Anemia - chronic, improving Non Hodgkin Lymphoma - reportedly in remission after recent chemo. VTE ppx  P: No chemoprophylaxis d/t recent hematuria. SCDS Follow CBC  INFECTIOUS A:   Leukocytosis on steroids, but WBC increasing with subjective fevers. ?HCAP   P:   Empiric ABX as above Follow cultures Check PCT  ENDOCRINE A:   No acute issues    P:   CBG checks while NPO  NEUROLOGIC A:  No acute issues  P:   Monitor   Georgann Housekeeper, ACNP [facility]/Critical Care Pager [phone] or [phone]  Respiratory failure likely secondary to deconditioning, can not r/o an evolving PNA however.  Patient is using secondary muscles of respiration.  Needs to be admitted to the ICU and start BiPAP.  I spoke with the wife and patient at length, they are ok with short term intubation only but no CPR/cardioversion as the patient has been struggling for quite sometime and is more concerned with quality of life rather than quantity.  I support the decision 100%.  Will admit to the ICU.  I have personally obtained a history, examined the patient, evaluated laboratory and imaging results, formulated the assessment and plan and placed orders.  CRITICAL CARE: The patient is critically ill with multiple organ systems failure and requires high complexity decision making for assessment and support, frequent evaluation and titration of therapies, application of advanced monitoring technologies and extensive interpretation of multiple databases. Critical Care Time  devoted to patient care services described in this note is 40 minutes.   Rush Farmer, M.D. [facility] Pulmonary/Critical Care Medicine. Pager: [phone]. After hours pager: [phone].

## 2013-09-20 NOTE — Progress Notes (Signed)
  Echocardiogram 2D Echocardiogram has been performed.  Willie [name] 09/20/2013, 2:20 PM

## 2013-09-20 NOTE — Progress Notes (Signed)
ANTIBIOTIC CONSULT NOTE - INITIAL  Pharmacy Consult for Vancomycin, Cefepime Indication: rule out HCAP  Allergies  Allergen Reactions  . Niaspan [Niacin Er] Rash    Patient Measurements: Height: 5\' 9"  (175.3 cm) Weight: 121 lb 0.5 oz (54.9 kg) IBW/kg (Calculated) : 70.7   Vital Signs: Temp: 98.5 F (36.9 C) (06/03 1039) Temp src: Axillary (06/03 1039) BP: 119/64 mmHg (06/03 1100) Pulse Rate: 108 (06/03 1100)  Labs:  Recent Labs  09/18/13 0600 09/20/13 0513  WBC  --  19.7*  HGB  --  11.0*  PLT  --  186  CREATININE 1.55* 1.30   Estimated Creatinine Clearance: 40.5 ml/min (by C-G formula based on Cr of 1.3). No results found for this basename: VANCOTROUGH, Corlis Leak, VANCORANDOM, [place], GENTPEAK, GENTRANDOM, [place], TOBRAPEAK, TOBRARND, AMIKACINPEAK, AMIKACINTROU, AMIKACIN,  in the last 72 hours   Microbiology: Recent Results (from the past 720 hour(s))  CULTURE, BLOOD (ROUTINE X 2)     Status: None   Collection Time    09/04/13  4:15 PM      Result Value Ref Range Status   Specimen Description BLOOD RIGHT FOREARM   Final   Special Requests BOTTLES DRAWN AEROBIC AND ANAEROBIC 5ML   Final   Culture  Setup Time     Final   Value: 09/04/2013 22:54     Performed at [facility]   Culture     Final   Value: NO GROWTH 5 DAYS     Performed at [facility]   Report Status 09/10/2013 FINAL   Final  CULTURE, BLOOD (ROUTINE X 2)     Status: None   Collection Time    09/04/13  6:11 PM      Result Value Ref Range Status   Specimen Description BLOOD LEFT ARM   Final   Special Requests BOTTLES DRAWN AEROBIC AND ANAEROBIC 4ML   Final   Culture  Setup Time     Final   Value: 09/04/2013 22:55     Performed at [facility]   Culture     Final   Value: NO GROWTH 5 DAYS     Performed at [facility]   Report Status 09/10/2013 FINAL   Final  URINE CULTURE     Status: None   Collection Time    09/15/13  2:23 PM      Result Value Ref  Range Status   Specimen Description URINE, CLEAN CATCH   Final   Special Requests NONE   Final   Culture  Setup Time     Final   Value: 09/15/2013 20:59     Performed at [facility]     Final   Value: NO GROWTH     Performed at [facility]   Culture     Final   Value: NO GROWTH     Performed at [facility]   Report Status 09/16/2013 FINAL   Final    Medical History: Past Medical History  Diagnosis Date  . Hypertension   . History of DVT of lower extremity     06/2001 as first sign of lymphoma: enlarged left inguinal nodes  . Low serum testosterone level 11/10/2011  . 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 Nocturia 11/10/2011  . Normochromic anemia 11/10/2011    Likely result of prior high dose chemotherapy  . Unexplained weight loss 05/03/2012  . Mitral valvular regurgitation 06/01/12    moderate to severe by  echo- asymptomatic  . Dyslipidemia   . RBBB   . Coronary artery disease   . Family history of anesthesia complication     "son, Margreta Journey, larynx collapses" (10/11/2012)  . Heart murmur   . History of blood transfusion 2009-2010    "both related to his chemo" (10/11/2012)  . Chronic renal insufficiency, stage III (moderate)   . Non Hodgkin's lymphoma 2003; 2009    "chemo; stem cell transplant & high powered chemo" (10/10/2012)  . Renal insufficiency   . Deep inguinal pain, right 05/26/2013  . Conjunctivitis, acute, right eye 06/28/2013    Assessment: 73 year old male currently being treated for lymphoma. He presented to the ED with shortness of breath 09/04/13.  Eventually transferred to rehab where he developed respiratory distress and hypoxemia today and was transferred back to the [facility] service.  Pharmacy asked to begin broad spectrum antibiotics for possible HCAP.  Goal of Therapy:  Vancomycin trough level 15-20 mcg/ml Appropriate cefepime dosing  Plan:  -Cefepime 1 Gram iv Q 12 hours -Vancomycin 500 mg iv Q 12 hours -  Follow fever curve, progress, renal function, cultures  Thank you. Anette Guarneri, PharmD [phone]  Tad Moore 09/20/2013,1:31 PM

## 2013-09-20 NOTE — Progress Notes (Signed)
Patient having increased dyspnea; O2 sats 92-94% on 2L 2 via nasal cannula.  Patient breathing through his mouth.  Dr. Naaman Plummer at bedside and aware of patient condition.  Awaiting orders.  Will continue to monitor.

## 2013-09-20 NOTE — Progress Notes (Signed)
Upon return from chest x-ray, patient's O2 sats were in the 80s.  O2 turned up to 4L: oxygen sats increased to 92-94%.  Georgann Housekeeper, NP at bedside and aware of patient condition.  Awaiting orders.

## 2013-09-20 NOTE — Progress Notes (Signed)
Report called to Janett Billow, RN on [facility]; all questions answered.  Patient transferred to [facility] by Marylin Crosby, NT and Elaina Pattee, RN.

## 2013-09-20 NOTE — H&P (Signed)
Name: Willie [name] MRN: [id number] DOB: Sep 07, 1940    ADMISSION DATE:  09/20/2013 CONSULTATION DATE:  09/04/2013  REFERRING MD :  Franchot Gallo PRIMARY SERVICE: PCCM  CHIEF COMPLAINT:  Dyspnea  BRIEF PATIENT DESCRIPTION: 73 y.o. w PMH of NHL, on chemo, presented 5/18 with severe SOB.  In ED, found to have small 5% right PTX, pneumomediastinum, dependent GGO's.  PCCM was consulted, conservative management recommended. Patient now on inpatient rehabilitation. Pulmonary consult called again for respiratory distress and hypoxemia. Chest X ray compatible with pulmonary edema. Patient improved with diuresis. 6/3 PCCM reconsult for SOB.   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 Developed respiratory distress and hypoxemia. Improved with diuresis.  09/20/13 - increased SOB, PCCM called. Transfer to ICU.  LINES / TUBES: PIV  CULTURES: Blood 5/18 >>> No growth Urine 5/29 > neg Blood 6/3 >>> Urine 6/3 >>> Sputum 6/3 >>>   ANTIBIOTICS: Valtrex 5/19 >>> Vanc 6/3 >>> Cefepime 6/3 >>>   SUBJECTIVE:  Increased SOB, not improved with additional IV lasix this AM. Subjective fevers. Denies cough/sputum production, and peripheral edema.   VITAL SIGNS: No intake or output data in the 24 hours ending 09/20/13 1401  Temp:  [97.4 F (36.3 C)-98.5 F (36.9 C)] 98.5 F (36.9 C) (06/03 1039) Pulse Rate:  [92-109] 108 (06/03 1100) Resp:  [20-22] 20 (06/03 1100) BP: (100-147)/(55-85) 119/64 mmHg (06/03 1100) SpO2:  [94 %-99 %] 95 % (06/03 1100) Weight:  [121 lb 0.5 oz (54.9 kg)-136 lb 7.4 oz (61.9 kg)] 121 lb 0.5 oz  (54.9 kg) (06/03 1300)  PHYSICAL EXAMINATION: General: elderly male in mild distress Neuro: Drowsy, alert to voice, oriented.  HEENT: [place]/AT. PERRL Cardiovascular: Tachy, 3/6 PSM.at apex Lungs: Respirations even and labored, paradoxical. .  Coarse crackles bibasilar Abdomen: BS x 4, soft, NT/ND.  Musculoskeletal: No gross deformities, 1+ pitting bilateral lower ext edema.  Skin: Intact, warm, no rashes.   Recent Labs Lab 09/17/13 1139 09/18/13 0600 09/20/13 0513  NA 135* 135* 135*  K 3.2* 3.5* 3.6*  CL 89* 89* 90*  CO2 39* 36* 37*  BUN 43* 35* 28*  CREATININE 1.86* 1.55* 1.30  GLUCOSE 106* 107* 97    Recent Labs Lab 09/15/13 0907 09/16/13 0702 09/17/13 0114 09/20/13 0513  HGB 8.2* 7.2* 10.4* 11.0*  HCT 23.0* 20.2* 30.2* 32.6*  WBC 21.4* 16.6*  --  19.7*  PLT 212 170  --  186   Dg Chest 2 View  09/20/2013   CLINICAL DATA:  sob  EXAM: CHEST  2 VIEW  COMPARISON:  Two-view chest 09/17/2013  FINDINGS: Stable mediastinal findings. The interstitial opacities have slightly decreased in conspicuity vaguely in the left lung base. No new focal regions of consolidation. No acute osseous abnormalities.  IMPRESSION: Slight improvement in the interstitial opacities. Otherwise stable chest radiograph.   Electronically Signed   By: Margaree Mackintosh M.D.   On: 09/20/2013 11:51      Lab Results  Component Value Date   ESRSEDRATE 33* 09/05/2013   ESRSEDRATE 5 11/01/2012   ESRSEDRATE 6 05/03/2012     Lab Results  Component Value Date  PROBNP 4148.0* 09/20/2013    Lactic Acid, Venous No components found with this basename: lacticacidven   ABG    Component Value Date/Time   PHART 7.539* 09/20/2013 1103   PCO2ART 45.3* 09/20/2013 1103   PO2ART 60.6* 09/20/2013 1103   HCO3 38.5* 09/20/2013 1103   TCO2 39.9 09/20/2013 1103   O2SAT 96.1 09/20/2013 1103   ASSESSMENT / PLAN:  PULMONARY A:  Acute respiratory failure edema vs HCAP vs less likely PE Bibasilar pneumonitis - likely hypersensitivity  pneumonitis secondary to drug reaction. Lit review does show a 1-10% incidence of pneumonitis with brentuximab (anti CD 30 agent) Small right apical PTX ,Pneumomediastinum -cause of this   Unclear- resolved ABG noted, multiple metabolic derangement.  I suspect that patient has gotten a large amount of lasix and now in contraction alkalosis and is retaining CO2 in compensation but CO2 is resulting in some narcosis. P:   Admit to ICU Hold BiPAP for now while we address the metabolic derangements as above. May require intubation if MS declines as I suspect simply dropping his CO2 will make him further alkalotic and will not benefit him. ABG noted as above. Follow CXR. PRN BD's Doppler BLE.  CARDIOVASCULAR A:  PAF currently sinus tach CAD On effient MR  P:  Poor candidate for anticoagulation per cards.  Diureses as below. Check lactic acid 2D echo ordered and pending. Further recommendations per cards.  RENAL A:   CKD - creatinine normalizing Metabolic Alkalosis and respiratory acidosis as discussed above.  P:  Monitor BMP NS 50 ml/hr to replace Cl Lasix 20 mg IV q6 x3 doses to address volume Acetazolamide 250 mg IV q6 x3 to address metabolic alkalosis KCl replacement Hematuria  GASTROINTESTINAL A:   GI PPX  P:   PPI Clear liquid diet incase we have to intubate.   HEMATOLOGIC A:   Anemia - chronic, improving Non Hodgkin Lymphoma - reportedly in remission after recent chemo. VTE ppx P: No chemoprophylaxis d/t recent hematuria. SCDS Follow CBC  INFECTIOUS A:   Leukocytosis on steroids, but WBC increasing with subjective fevers. ?HCAP   P:   Empiric ABX as above Follow cultures Check PCT  ENDOCRINE A:   No acute issues    P:   CBG checks while NPO  NEUROLOGIC A:  No acute issues  P:   Monitor  I have personally obtained a history, examined the patient, evaluated laboratory and imaging results, formulated the assessment and plan and placed  orders.  CRITICAL CARE: The patient is critically ill with multiple organ systems failure and requires high complexity decision making for assessment and support, frequent evaluation and titration of therapies, application of advanced monitoring technologies and extensive interpretation of multiple databases. Critical Care Time devoted to patient care services described in this note is 40 minutes.   Rush Farmer, M.D. [facility] Pulmonary/Critical Care Medicine. Pager: [phone]. After hours pager: [phone].

## 2013-09-20 NOTE — Progress Notes (Signed)
Order for tracheal aspirate discontinued and replaced with an order for a sputum culture. Patient is non-intubated and does not require NTS at this time. ABG order also discontinued due to ABG being obtained at 1100 in CIR.

## 2013-09-21 ENCOUNTER — Inpatient Hospital Stay (HOSPITAL_COMMUNITY): Payer: Medicare Other

## 2013-09-21 DIAGNOSIS — J9600 Acute respiratory failure, unspecified whether with hypoxia or hypercapnia: Secondary | ICD-10-CM

## 2013-09-21 DIAGNOSIS — R0602 Shortness of breath: Secondary | ICD-10-CM

## 2013-09-21 DIAGNOSIS — R9439 Abnormal result of other cardiovascular function study: Secondary | ICD-10-CM

## 2013-09-21 DIAGNOSIS — J42 Unspecified chronic bronchitis: Secondary | ICD-10-CM

## 2013-09-21 DIAGNOSIS — E873 Alkalosis: Secondary | ICD-10-CM

## 2013-09-21 LAB — CBC
HEMATOCRIT: 31.9 % — AB (ref 39.0–52.0)
Hemoglobin: 11.2 g/dL — ABNORMAL LOW (ref 13.0–17.0)
MCH: 33.7 pg (ref 26.0–34.0)
MCHC: 35.1 g/dL (ref 30.0–36.0)
MCV: 96.1 fL (ref 78.0–100.0)
Platelets: 225 10*3/uL (ref 150–400)
RBC: 3.32 MIL/uL — AB (ref 4.22–5.81)
RDW: 19.8 % — ABNORMAL HIGH (ref 11.5–15.5)
WBC: 19.7 10*3/uL — AB (ref 4.0–10.5)

## 2013-09-21 LAB — URINE CULTURE
Colony Count: NO GROWTH
Culture: NO GROWTH

## 2013-09-21 LAB — TROPONIN I: Troponin I: <0.3 ng/mL

## 2013-09-21 LAB — PHOSPHORUS: Phosphorus: 2.3 mg/dL (ref 2.3–4.6)

## 2013-09-21 LAB — BLOOD GAS, ARTERIAL
Acid-Base Excess: 8.6 mmol/L — ABNORMAL HIGH (ref 0.0–2.0)
BICARBONATE: 32 meq/L — AB (ref 20.0–24.0)
Drawn by: 347621
O2 CONTENT: 4 L/min
O2 Saturation: 94.4 %
PATIENT TEMPERATURE: 97.7
PH ART: 7.534 — AB (ref 7.350–7.450)
TCO2: 33.2 mmol/L (ref 0–100)
pCO2 arterial: 37.9 mmHg (ref 35.0–45.0)
pO2, Arterial: 63.4 mmHg — ABNORMAL LOW (ref 80.0–100.0)

## 2013-09-21 LAB — BASIC METABOLIC PANEL
BUN: 33 mg/dL — AB (ref 6–23)
CO2: 33 mEq/L — ABNORMAL HIGH (ref 19–32)
Calcium: 8.8 mg/dL (ref 8.4–10.5)
Chloride: 86 mEq/L — ABNORMAL LOW (ref 96–112)
Creatinine, Ser: 1.4 mg/dL — ABNORMAL HIGH (ref 0.50–1.35)
GFR calc Af Amer: 57 mL/min — ABNORMAL LOW (ref >90)
GFR, EST NON AFRICAN AMERICAN: 49 mL/min — AB (ref >90)
Glucose, Bld: 101 mg/dL — ABNORMAL HIGH (ref 70–99)
POTASSIUM: 3.1 meq/L — AB (ref 3.7–5.3)
SODIUM: 132 meq/L — AB (ref 137–147)

## 2013-09-21 LAB — PROCALCITONIN: PROCALCITONIN: 0.2 ng/mL

## 2013-09-21 LAB — MAGNESIUM: Magnesium: 1.8 mg/dL (ref 1.5–2.5)

## 2013-09-21 MED ORDER — POTASSIUM CHLORIDE CRYS ER 20 MEQ PO TBCR
30.0000 meq | EXTENDED_RELEASE_TABLET | ORAL | Status: AC
  Administered 2013-09-21 (×2): 30 meq via ORAL
  Filled 2013-09-21 (×4): qty 1

## 2013-09-21 MED ORDER — LEVOFLOXACIN IN D5W 750 MG/150ML IV SOLN
750.0000 mg | INTRAVENOUS | Status: DC
  Administered 2013-09-21 – 2013-09-23 (×2): 750 mg via INTRAVENOUS
  Filled 2013-09-21 (×3): qty 150

## 2013-09-21 MED ORDER — METOPROLOL TARTRATE 12.5 MG HALF TABLET
12.5000 mg | ORAL_TABLET | Freq: Two times a day (BID) | ORAL | Status: DC
  Administered 2013-09-21 (×2): 12.5 mg via ORAL
  Filled 2013-09-21 (×5): qty 1

## 2013-09-21 MED ORDER — POTASSIUM CHLORIDE CRYS ER 20 MEQ PO TBCR: 40.0000 meq | EXTENDED_RELEASE_TABLET | ORAL | Status: DC

## 2013-09-21 MED ORDER — LEVOFLOXACIN IN D5W 750 MG/150ML IV SOLN: 750.0000 mg | INTRAVENOUS | Status: DC

## 2013-09-21 MED ORDER — PANTOPRAZOLE SODIUM 40 MG PO TBEC
40.0000 mg | DELAYED_RELEASE_TABLET | Freq: Every day | ORAL | Status: DC
  Administered 2013-09-21 – 2013-09-23 (×3): 40 mg via ORAL
  Filled 2013-09-21 (×3): qty 1

## 2013-09-21 NOTE — Progress Notes (Signed)
VASCULAR LAB PRELIMINARY  PRELIMINARY  PRELIMINARY  PRELIMINARY  Bilateral lower extremity venous Dopplers completed.    Preliminary report:  There is no evidence of acute DVT or SVT noted in the bilateral lower extremities.    Iantha Fallen, RVT 09/21/2013, 9:53 AM

## 2013-09-21 NOTE — Care Management Note (Addendum)
    Page 1 of 1   09/25/2013     12:37:49 PM CARE MANAGEMENT NOTE 09/25/2013  Patient:  Willie [name], Willie [name]   Account Number:  [id number]  Date Initiated:  09/21/2013  Documentation initiated by:  Elissa Hefty  Subjective/Objective Assessment:   adm w alkalosis     Action/Plan:   lives w wife, pcp dr Thayer Jew pharr   Anticipated DC Date:  09/25/2013   Anticipated DC Plan:  [place] referral  Clinical Social Worker         Choice offered to / List presented to:             Status of service:   Medicare Important Message given?  YES (If response is "NO", the following Medicare IM given date fields will be blank) Date Medicare IM given:  09/25/2013 Date Additional Medicare IM given:    Discharge Disposition:  [place]  Per UR Regulation:  Reviewed for med. necessity/level of care/duration of stay  If discussed at [place] of Stay Meetings, dates discussed:   Oct 13, 2013    Comments:  6/4 0811 debbie Mamie Diiorio rn,bsn was at inpt rehab pta.

## 2013-09-21 NOTE — Progress Notes (Signed)
Name: WAH SABIC MRN: [id number] DOB: 10-28-1940    ADMISSION DATE:  09/20/2013 CONSULTATION DATE:  09/04/2013  REFERRING MD :  Franchot Gallo PRIMARY SERVICE: PCCM  CHIEF COMPLAINT:  Dyspnea  BRIEF PATIENT DESCRIPTION: 73 y.o. w PMH of NHL, on chemo, presented 5/18 with severe SOB.  In ED, found to have small 5% right PTX, pneumomediastinum, dependent GGO's.  PCCM was consulted, conservative management recommended. Patient now on inpatient rehabilitation. Pulmonary consult called again for respiratory distress and hypoxemia. Chest X ray compatible with pulmonary edema. Patient improved with diuresis. 6/3 PCCM reconsult for SOB.    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 5/18 - admitted with severe SOB, found to have pneumomediastinum, PTX, and GGO's (unchanged from CT on 08/25/13) 5/20 - Echo Preserved LV function, DD due to a-flutter 5/29 - On inpatient rehab. Developed respiratory distress and hypoxemia. Improved with diuresis.  6/03 - increased SOB, PCCM called. Transfer to ICU. 6/03 - LE Doppler >> 6/03 - ECHO >>  6/04 - Afib w RVR overnight, known PAF  LINES / TUBES: PIV  CULTURES: Blood 5/18 >>> No growth Urine 5/29 > neg Blood 6/3 >>> Urine 6/3 >>> Sputum 6/3 >>>   ANTIBIOTICS: Valtrex 5/19 >>> Vanc 6/3 >>> Cefepime 6/3 >>> Levofloxacin 6/4>>>   SUBJECTIVE: RN reports Afib w RVR overnight, no other acute events.  Pt wanting regular diet  VITAL SIGNS:  Intake/Output Summary (Last 24 hours) at 09/21/13 1201 Last data filed at 09/21/13 1100  Gross per 24 hour  Intake 2901.67 ml  Output   3225 ml  Net -323.33 ml    Temp:  [97.3 F (36.3 C)-98.6  F (37 C)] 98.6 F (37 C) (06/04 1135) Resp:  [15-33] 21 (06/04 1100) BP: (94-149)/(52-77) 132/73 mmHg (06/04 1100) SpO2:  [94 %-100 %] 100 % (06/04 1100) Weight:  [118 lb 2.7 oz (53.6 kg)-121 lb 0.5 oz (54.9 kg)] 118 lb 2.7 oz (53.6 kg) (06/04 0314)  PHYSICAL EXAMINATION: General: elderly male in NAD, appears overall deconditioned / weak Neuro: Drowsy but awakens easily, oriented, MAE.  HEENT: [place]/AT. PERRL Cardiovascular: Tachy, 3/6 PSM.at apex Lungs: Respirations even and labored, reduced crackles bilaterallly Abdomen: BS x 4, soft, NT/ND.  Musculoskeletal: No gross deformities, 1+ pitting bilateral lower ext edema.  Skin: Intact, warm, no rashes.   Recent Labs Lab 09/18/13 0600 09/20/13 0513 09/21/13 0125  NA 135* 135* 132*  K 3.5* 3.6* 3.1*  CL 89* 90* 86*  CO2 36* 37* 33*  BUN 35* 28* 33*  CREATININE 1.55* 1.30 1.40*  GLUCOSE 107* 97 101*    Recent Labs Lab 09/16/13 0702 09/17/13 0114 09/20/13 0513 09/21/13 0125  HGB 7.2* 10.4* 11.0* 11.2*  HCT 20.2* 30.2* 32.6* 31.9*  WBC 16.6*  --  19.7* 19.7*  PLT 170  --  186 225   Dg Chest 2 View  09/20/2013   CLINICAL DATA:  sob  EXAM: CHEST  2 VIEW  COMPARISON:  Two-view chest 09/17/2013  FINDINGS: Stable mediastinal findings. The interstitial opacities have slightly decreased in conspicuity vaguely in the left lung base. No new focal regions of consolidation. No acute osseous abnormalities.  IMPRESSION: Slight improvement in the interstitial opacities. Otherwise stable chest radiograph.   Electronically Signed   By: Margaree Mackintosh M.D.   On: 09/20/2013 11:51  Dg Chest Port 1 View  09/21/2013   CLINICAL DATA:  Pulmonary edema.  EXAM: PORTABLE CHEST - 1 VIEW  COMPARISON:  09/20/2013  FINDINGS: There are persistent interstitial densities in the left lower lung. Mildly improved aeration in the right lung. Heart size is stable. Patient has median sternotomy wires. Probable biapical lung scarring.  IMPRESSION: Persistent  interstitial densities in the left lower chest. Findings probably represent asymmetric edema based on the prior chest radiographs. Infection in the left lung cannot be excluded and recommend continued follow up.   Electronically Signed   By: Richarda Overlie M.D.   On: 09/21/2013 07:39      Lab Results  Component Value Date   ESRSEDRATE 33* 09/05/2013   ESRSEDRATE 5 11/01/2012   ESRSEDRATE 6 05/03/2012     Lab Results  Component Value Date   PROBNP 4148.0* 09/20/2013    Lactic Acid, Venous No components found with this basename: lacticacidven   ABG    Component Value Date/Time   PHART 7.534* 09/21/2013 0508   PCO2ART 37.9 09/21/2013 0508   PO2ART 63.4* 09/21/2013 0508   HCO3 32.0* 09/21/2013 0508   TCO2 33.2 09/21/2013 0508   O2SAT 94.4 09/21/2013 0508     ASSESSMENT / PLAN:  PULMONARY A:  Acute respiratory failure edema vs HCAP vs less likely PE Bibasilar 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 Lit review does show a 1-10% incidence of pneumonitis with brentuximab (anti CD 30 agent) Small right apical PTX , Pneumomediastinum - unclear etiology, resolved. Respiratory alkalosis  P:   Monitor in ICU, remains moderate risk for intubation BiPAP PRN for increased WOB Follow CXR PRN BD's Doppler BLE Pulmonary hygiene - flutter, mobilize Prednisone 60 mg, seems to be improving clinically with this approach May need repeat CT chest if declines  CARDIOVASCULAR A:  PAF - events noted overnight 6/4 CAD On effient MR  P:  Poor candidate for anticoagulation per cards.  2D echo pending Continue Effient Lasix 20 mg Q6 x3 doses (6/4) - dc further, likely have maximized this drug Resume PO lopressor 12.5 BID (on 25 QD at home) + PRN IV Lopressor  RENAL A:   CKD - creatinine normalizing Metabolic Alkalosis - overdiuresis  P:  Monitor BMP Saline Lock Hematuria Dc lasix Allow pos balance  k supp  GASTROINTESTINAL A:   GI PPX  P:    PPI Advance diet as tolerated to heart healthy Aspiration precautions  May need slp in future  HEMATOLOGIC A:   Anemia - chronic, improving Non Hodgkin Lymphoma - reportedly in remission after recent chemo. VTE ppx  P: No chemoprophylaxis d/t recent hematuria. SCDS Follow CBC  INFECTIOUS A:   Leukocytosis on steroids, but WBC increasing with subjective fevers. ?HCAP   P:   Empiric ABX ensure to give full course atypical agents - levofloxacin Narrow abx in am (vanc, cef) Follow cultures Trend PCT - may help limit abx course  ENDOCRINE A:   No acute issues    P:   Follow CBG  NEUROLOGIC A:  No acute issues  P:   Monitor  GLOBAL: Family ok for short term intubation only but no CPR / Cardioversion  Partial Code in place PT consult   ^^Contacted Vascular / ECHO tech's regarding LE Doppler / ECHO results. Appears in computer as LE Doppler with ECHO results attached.  Asked to verify correct test / patient and adjust accordingly.   Canary Brim, NP-C [facility] Pgr: [phone] or  [phone]   I have personally obtained a history, examined the patient, evaluated laboratory and imaging results, formulated the assessment and plan and placed orders.  Lavon Paganini. Titus Mould, MD, [place] Pgr: [facility]

## 2013-09-21 NOTE — Discharge Summary (Signed)
[name], [name] NO.:  [id number]  MEDICAL RECORD NO.:  [id number]  LOCATION:  4M07C                        FACILITY:  [facility]  PHYSICIAN:  Willie [name], M.D.DATE OF BIRTH:  20-Jun-1940  DATE OF ADMISSION:  09/12/2013 DATE OF DISCHARGE:  09/20/2013                              DISCHARGE SUMMARY   DISCHARGE DIAGNOSES: 1. Functional deficits secondary to deconditioning, pneumonia with     respiratory failure, and history of non-Hodgkin's lymphoma. 2. Subcutaneous Lovenox for deep vein thrombosis prophylaxis     discontinued secondary to hematuria. 3. Pain management. 4. Non-Hodgkin's lymphoma. 5. Hypertension. 6. Coronary artery disease with CABG-fluid overload with atrial     flutter. 7. Hyperlipidemia. 8. Chronic renal insufficiency. 9. Hyponatremia, resolved. 10.Hypokalemia. 11.Anemia. 12.Constipation.  HISTORY OF PRESENT ILLNESS:  This is a 73 year old right-handed male with multimedical history of CAD, non-Hodgkin's lymphoma presented Sep 04, 2013, with shortness of breath.  No chest pain.  Received PET scan showing findings consistent with pneumonia.  Developed low-grade fever. Chest x-ray with interval development of bilateral perihilar and basilar interstitial densities concerning for pulmonary edema and likely pneumonia.  CT angio of the chest showed no pulmonary emboli. Maintained on broad-spectrum antibiotics.  Follow up with Hematology Services for non-Hodgkin's lymphoma.  Findings of small right typical pneumothorax monitored as well as a pneumomediastinum versus chemotherapy-related chemical pneumonitis and maintained on steroids. Follow up with Cardiothoracic Surgery in reference to the pneumomediastinum with Gastrografin swallow completed showing no evidence of leak, no surgery indicated.  Echocardiogram with ejection fraction of 60%.  No wall motion abnormalities.  Placed on Lovenox for DVT prophylaxis, discontinued secondary to  hematuria.  Hyponatremia 124- 126 and monitored.  The patient was admitted for a comprehensive rehab program.  PAST MEDICAL HISTORY:  See discharge diagnoses.  SOCIAL HISTORY:  Lives with wife.  FUNCTIONAL HISTORY:  Prior to admission, independent.  Functional status upon admission to rehab services was min assist to ambulate with a rolling walker 50 feet, min assist sit to stand, min assist stand pivot transfers, min to mod assist for activities of daily living.  PHYSICAL EXAMINATION:  VITAL SIGNS:  Blood pressure 146/68, pulse 108, temperature 97, respirations 18. GENERAL:  This was an alert, frail male, mildly short of breath. HEENT:  Pupils round and reactive to light. LUNGS:  Decreased breath sounds at the bases, particularly on the right. ABDOMEN:  Soft, nontender.  Good bowel sounds. CARDIAC:  Regular rate and rhythm.  REHABILITATION HOSPITAL COURSE:  The patient was admitted to inpatient rehab services with therapies initiated on a 3-hour daily basis consisting of physical therapy, occupational therapy, and rehabilitation nursing.  The following issues were addressed during the patient's rehabilitation stay.  Pertaining to [name] deconditioning related to pneumonia, respiratory failure, non-Hodgkin's lymphoma, multi medical.  He was participating with therapies with limited gains due to fatigue and endurance factors.  He was followed at a distance by Oncology Services for his lymphoma, maintained on steroids as directed for suspect chemo-related chemical pneumonitis and taper as advised. Chemotherapy program was to be re-evaluated as an outpatient.  Blood pressures monitored closely on Lopressor.  Noted history of coronary artery disease with CABG, maintained on Effient.  Ongoing bouts of increasing shortness of breath.  Chest x-ray showed pulmonary edema.  He was maintained on Lasix, titrated, initial bout of Lasix yielded 2300 mL output.  The patient responded  well.  Cardiology Service was also consulted in relation to bouts of atrial flutter, rate controlled.  He was not a candidate for anticoagulation.  Bouts of anemia, he had been transfused.  Noted on the morning of September 20, 2013, again with increasing shortness of breath, felt to be related to his fluid overload.  He did receive extra dose of Lasix therapy intravenously with very little response.  Pulmonary Service was consulted in relation to his breathing respiratory tolerance.  It was felt the patient should be discharged to Acute Care Service to acknowledged all medical issues.  This was discussed at length with wife during his rehabilitation stay.  She was quite comfortable with his overall care that he had been receiving on the rehab unit and understand his limitations in need for Acute Care Services.  The patient's condition at time of discharge was guarded. All medication changes were made as per critical care services.     Willie [name], P.A.   ______________________________ Willie [name], M.D.    DA/MEDQ  D:  09/20/2013  T:  09/21/2013  Job:  [id number]

## 2013-09-21 NOTE — Progress Notes (Signed)
[facility] ADULT ICU REPLACEMENT PROTOCOL FOR AM LAB REPLACEMENT ONLY  The patient does apply for the [facility] Adult ICU Electrolyte Replacment Protocol based on the criteria listed below:   1. Is GFR >/= 40 ml/min? yes  Patient's GFR today is 49 2. Is urine output >/= 0.5 ml/kg/hr for the last 6 hours? yes Patient's UOP is 4.5 ml/kg/hr 3. Is BUN < 60 mg/dL? yes  Patient's BUN today is 33 4. Abnormal electrolyte(s): K 3.1, Mg 1.8 5. Ordered repletion with: per protocol 6. If a panic level lab has been reported, has the CCM MD in charge been notified? no.   Physician:    Billy Fischer 09/21/2013 4:22 AM

## 2013-09-22 ENCOUNTER — Inpatient Hospital Stay (HOSPITAL_COMMUNITY): Payer: Medicare Other

## 2013-09-22 ENCOUNTER — Encounter (HOSPITAL_COMMUNITY): Payer: Self-pay | Admitting: *Deleted

## 2013-09-22 DIAGNOSIS — T65891A Toxic effect of other specified substances, accidental (unintentional), initial encounter: Secondary | ICD-10-CM

## 2013-09-22 DIAGNOSIS — N183 Chronic kidney disease, stage 3 unspecified: Secondary | ICD-10-CM

## 2013-09-22 DIAGNOSIS — I2510 Atherosclerotic heart disease of native coronary artery without angina pectoris: Secondary | ICD-10-CM

## 2013-09-22 DIAGNOSIS — C8409 Mycosis fungoides, extranodal and solid organ sites: Secondary | ICD-10-CM

## 2013-09-22 DIAGNOSIS — R509 Fever, unspecified: Secondary | ICD-10-CM

## 2013-09-22 DIAGNOSIS — J680 Bronchitis and pneumonitis due to chemicals, gases, fumes and vapors: Secondary | ICD-10-CM

## 2013-09-22 HISTORY — DX: Bronchitis and pneumonitis due to chemicals, gases, fumes and vapors: J68.0

## 2013-09-22 LAB — CBC
HEMATOCRIT: 28.2 % — AB (ref 39.0–52.0)
Hemoglobin: 9.8 g/dL — ABNORMAL LOW (ref 13.0–17.0)
MCH: 33.6 pg (ref 26.0–34.0)
MCHC: 34.8 g/dL (ref 30.0–36.0)
MCV: 96.6 fL (ref 78.0–100.0)
PLATELETS: 160 10*3/uL (ref 150–400)
RBC: 2.92 MIL/uL — AB (ref 4.22–5.81)
RDW: 19.7 % — ABNORMAL HIGH (ref 11.5–15.5)
WBC: 22 10*3/uL — ABNORMAL HIGH (ref 4.0–10.5)

## 2013-09-22 LAB — URINALYSIS, ROUTINE W REFLEX MICROSCOPIC
Bilirubin Urine: NEGATIVE
GLUCOSE, UA: 250 mg/dL — AB
Ketones, ur: NEGATIVE mg/dL
Nitrite: NEGATIVE
Protein, ur: 30 mg/dL — AB
Specific Gravity, Urine: 1.023 (ref 1.005–1.030)
UROBILINOGEN UA: 1 mg/dL (ref 0.0–1.0)
pH: 7 (ref 5.0–8.0)

## 2013-09-22 LAB — BASIC METABOLIC PANEL
BUN: 41 mg/dL — AB (ref 6–23)
CO2: 28 mEq/L (ref 19–32)
Calcium: 8.4 mg/dL (ref 8.4–10.5)
Chloride: 91 mEq/L — ABNORMAL LOW (ref 96–112)
Creatinine, Ser: 1.32 mg/dL (ref 0.50–1.35)
GFR calc Af Amer: 61 mL/min — ABNORMAL LOW (ref >90)
GFR, EST NON AFRICAN AMERICAN: 53 mL/min — AB (ref >90)
Glucose, Bld: 99 mg/dL (ref 70–99)
Potassium: 3.3 mEq/L — ABNORMAL LOW (ref 3.7–5.3)
Sodium: 130 mEq/L — ABNORMAL LOW (ref 137–147)

## 2013-09-22 LAB — URINE MICROSCOPIC-ADD ON

## 2013-09-22 LAB — PROCALCITONIN: Procalcitonin: 0.14 ng/mL

## 2013-09-22 LAB — MAGNESIUM: Magnesium: 1.7 mg/dL (ref 1.5–2.5)

## 2013-09-22 MED ORDER — POTASSIUM CHLORIDE CRYS ER 20 MEQ PO TBCR
20.0000 meq | EXTENDED_RELEASE_TABLET | ORAL | Status: AC
  Administered 2013-09-22 (×2): 20 meq via ORAL
  Filled 2013-09-22 (×2): qty 1

## 2013-09-22 MED ORDER — BIOTENE DRY MOUTH MT LIQD
15.0000 mL | Freq: Two times a day (BID) | OROMUCOSAL | Status: DC
  Administered 2013-09-22 – 2013-09-25 (×3): 15 mL via OROMUCOSAL

## 2013-09-22 MED ORDER — METOPROLOL TARTRATE 12.5 MG HALF TABLET
12.5000 mg | ORAL_TABLET | Freq: Once | ORAL | Status: AC
  Administered 2013-09-22: 12.5 mg via ORAL
  Filled 2013-09-22: qty 1

## 2013-09-22 MED ORDER — CHLORHEXIDINE GLUCONATE 0.12 % MT SOLN
15.0000 mL | Freq: Two times a day (BID) | OROMUCOSAL | Status: DC
  Administered 2013-09-22 (×2): 15 mL via OROMUCOSAL
  Filled 2013-09-22 (×3): qty 15

## 2013-09-22 MED ORDER — METOPROLOL TARTRATE 25 MG PO TABS
25.0000 mg | ORAL_TABLET | Freq: Two times a day (BID) | ORAL | Status: DC
  Administered 2013-09-22 – 2013-09-24 (×3): 25 mg via ORAL
  Filled 2013-09-22 (×6): qty 1

## 2013-09-22 MED ORDER — SODIUM CHLORIDE 0.9 % IV BOLUS (SEPSIS)
500.0000 mL | Freq: Once | INTRAVENOUS | Status: AC
  Administered 2013-09-22: 13:00:00 via INTRAVENOUS

## 2013-09-22 MED ORDER — ENSURE PUDDING PO PUDG
1.0000 | Freq: Three times a day (TID) | ORAL | Status: DC
  Administered 2013-09-22 – 2013-09-23 (×3): 1 via ORAL

## 2013-09-22 NOTE — Progress Notes (Addendum)
INITIAL NUTRITION ASSESSMENT  DOCUMENTATION CODES Per approved criteria  -Severe malnutrition in the context of chronic illness  Pt meets criteria for severe MALNUTRITION in the context of chronic illness as evidenced by severe fat and muscle wasting.  INTERVENTION: - Ensure Complete po TID, each supplement provides 350 kcal and 13 grams of protein - Recommend re-weighing pt. Suspect most recent weight is incorrect. - Recommend that Willie [name] be added when medically appropriate.  NUTRITION DIAGNOSIS: Inadequate oral intake related to Non-Hodgkin's Lymphoma as evidenced by weight loss and reported intake less than estimated needs.   Goal: [name] >/= 90% of their estimated nutrition needs   Monitor:  Weight trends, acceptance of supplements, po intake, medications, labs  Reason for Assessment: Consult, MST, Low BMI  73 y.o. male  Admitting Dx: <principal problem not specified>  ASSESSMENT: 73 y.o. w PMH of NHL, on chemo, presented 5/18 with severe SOB. In ED, found to have small 5% right PTX, pneumomediastinum, dependent GGO's. PCCM was consulted, conservative management recommended.  Patient now on inpatient rehabilitation. Pulmonary consult called again for respiratory distress and hypoxemia. Chest X ray compatible with pulmonary edema.  - [name] report his usual body weight as 137 lbs - They report his current weight at 132 lbs. Most recent weight is chart is 118 lbs. Family suspects that this is incorrect. - [name] reports that pt's appetite increased when he was put on Willie [name]. Recommend that this be resumed if medically appropriate.   Nutrition Focused Physical Exam:  Subcutaneous Fat:  Orbital Region: severe wasting Upper Arm Region: severe wasting Thoracic and Lumbar Region: n/a  Muscle:  Temple Region: severe wasting Clavicle Bone Region: severe wasting Clavicle and Acromion Bone Region: severe wasting Scapular Bone Region: severe wasting Dorsal Hand:  severe wasting Patellar Region: severe wasting Anterior Thigh Region: n/a Posterior Calf Region: moderate  Edema: none  Height: Ht Readings from Last 1 Encounters:  09/20/13 5\' 9"  (1.753 m)    Weight: Wt Readings from Last 1 Encounters:  09/22/13 119 lb 14.4 oz (54.386 kg)    Ideal Body Weight: 70.7 kg  % Ideal Body Weight: 85%  Wt Readings from Last 10 Encounters:  09/22/13 119 lb 14.4 oz (54.386 kg)  09/20/13 136 lb 7.4 oz (61.9 kg)  09/10/13 134 lb (60.782 kg)  08/30/13 133 lb 6.4 oz (60.51 kg)  07/31/13 131 lb 4.8 oz (59.557 kg)  07/12/13 123 lb 12.8 oz (56.155 kg)  06/28/13 125 lb 3.2 oz (56.79 kg)  06/21/13 129 lb (58.514 kg)  06/19/13 129 lb 4.8 oz (58.65 kg)  06/17/13 130 lb 12 oz (59.308 kg)    Usual Body Weight: 137 lbs, per family report  % Usual Body Weight: 96%, based on current body weight of 132 lbs (per family report)  BMI:  Body mass index is 17.7 kg/(m^2).  Estimated Nutritional Needs: Kcal: 1700-1900 Protein: 80-90 g Fluid: 1.7-1.9 L/day  Skin: WNL  Diet Order: Cardiac  EDUCATION NEEDS: -Education needs addressed   Intake/Output Summary (Last 24 hours) at 09/22/13 1318 Last data filed at 09/22/13 1238  Gross per 24 hour  Intake   1463 ml  Output   1380 ml  Net     83 ml    Last BM: 6/4   Labs:   Recent Labs Lab 09/20/13 0513 09/21/13 0125 09/22/13 0434  NA 135* 132* 130*  K 3.6* 3.1* 3.3*  CL 90* 86* 91*  CO2 37* 33* 28  BUN 28* 33* 41*  CREATININE 1.30 1.40* 1.32  CALCIUM 8.9 8.8 8.4  MG  --  1.8 1.7  PHOS  --  2.3  --   GLUCOSE 97 101* 99    CBG (last 3)  No results found for this basename: GLUCAP,  in the last 72 hours  Scheduled Meds: . antiseptic oral rinse  15 mL Mouth Rinse q12n4p  . aspirin EC  81 mg Oral QPM  . atorvastatin  20 mg Oral q1800  . chlorhexidine  15 mL Mouth Rinse BID  . levofloxacin (LEVAQUIN) IV  750 mg Intravenous Q48H  . metoprolol tartrate  12.5 mg Oral BID  . pantoprazole  40 mg  Oral Daily  . prasugrel  10 mg Oral QPM  . predniSONE  60 mg Oral Q breakfast  . sodium chloride  3 mL Intravenous Q12H  . valACYclovir  500 mg Oral Daily    Continuous Infusions: . sodium chloride 50 mL/hr at 09/20/13 1434    Past Medical History  Diagnosis Date  . Hypertension   . History of DVT of lower extremity     06/2001 as first sign of lymphoma: enlarged left inguinal nodes  . Low serum testosterone level 11/10/2011  . 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 Nocturia 11/10/2011  . Normochromic anemia 11/10/2011    Likely result of prior high dose chemotherapy  . Unexplained weight loss 05/03/2012  . Mitral valvular regurgitation 06/01/12    moderate to severe by echo- asymptomatic  . Dyslipidemia   . RBBB   . Coronary artery disease   . Family history of anesthesia complication     "son, Margreta Journey, larynx collapses" (10/11/2012)  . Heart murmur   . History of blood transfusion 2009-2010    "both related to his chemo" (10/11/2012)  . Chronic renal insufficiency, stage III (moderate)   . Non Hodgkin's lymphoma 2003; 2009    "chemo; stem cell transplant & high powered chemo" (10/10/2012)  . Renal insufficiency   . Deep inguinal pain, right 05/26/2013  . Conjunctivitis, acute, right eye 06/28/2013  . 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  New dx on admission 09/04/13    Past Surgical History  Procedure Laterality Date  . Bone marrow transplant  2010  . Cardiac catheterization  10/03/2012; 2007  . Coronary angioplasty with stent placement  10/11/2012    "2" (10/11/2012)  . Tonsillectomy  1950's  . Cataract extraction w/ intraocular lens implant Left 1990's  . Coronary artery bypass graft  03/29/2006    "CABG X5" (10/11/2012)    Willie [name] RD, LDN

## 2013-09-22 NOTE — Evaluation (Signed)
Clinical/Bedside Swallow Evaluation Patient Details  Name: Willie [name] MRN: [id number] Date of Birth: 1940-09-17  Today's Date: 09/22/2013 Time: 6759-1638 SLP Time Calculation (min): 24 min  Past Medical History:  Past Medical History  Diagnosis Date  . Hypertension   . History of DVT of lower extremity     06/2001 as first sign of lymphoma: enlarged left inguinal nodes  . Low serum testosterone level 11/10/2011  . 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 Nocturia 11/10/2011  . Normochromic anemia 11/10/2011    Likely result of prior high dose chemotherapy  . Unexplained weight loss 05/03/2012  . Mitral valvular regurgitation 06/01/12    moderate to severe by echo- asymptomatic  . Dyslipidemia   . RBBB   . Coronary artery disease   . Family history of anesthesia complication     "son, Margreta Journey, larynx collapses" (10/11/2012)  . Heart murmur   . History of blood transfusion 2009-2010    "both related to his chemo" (10/11/2012)  . Chronic renal insufficiency, stage III (moderate)   . Non Hodgkin's lymphoma 2003; 2009    "chemo; stem cell transplant & high powered chemo" (10/10/2012)  . Renal insufficiency   . Deep inguinal pain, right 05/26/2013  . Conjunctivitis, acute, right eye 06/28/2013  . 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  New dx on admission 09/04/13   Past Surgical History:  Past Surgical History  Procedure Laterality Date  . Bone marrow transplant  2010  . Cardiac catheterization  10/03/2012; 2007  . Coronary angioplasty with stent placement  10/11/2012    "2" (10/11/2012)  . Tonsillectomy  1950's  . Cataract extraction w/ intraocular lens implant Left 1990's  . Coronary artery bypass graft  03/29/2006    "CABG X5" (10/11/2012)   HPI:  73 y.o. w/ PMH of non Hodgkins lymphoma, on chemo, HTN, CABG, CAD, renal insufficiency admitted with severe SOB and  found to have small pneumothorax, pneumomediastinum, no intubation required.   Transferred to inpatient rehab and developed  respiratory distress and hypoxemia. Chest X ray compatible with pulmonary edema.  CXR 6/4 persistent interstitial densities in the left lower chest. Infection in the left lung cannot be excluded.  Repeat CXR 6/5 Little change in asymmetric interstitial opacities left greater than right.  Pt. and family deny past swallow difficulty.  Consult may have originated from mild difficulty with pills this morning.   Assessment / Plan / Recommendation Clinical Impression  Pt. demonstrating increased work of breathing upon SLP arrival for swallow assessment.  No overt s/s aspiration exhibited although risk is moderately increased risk due to increased work of breathing.  Coordination of respiration and swallow initiation was challenging with inhalation observed immediately following swallow versus normal pattern of exhalation.  He attempts to compensate with holding liquid bolus in oral cavity prior to initiating swallow with adequate tracheal protection.  Recommend he continue a regular texture diet, thin liquids, pills in applesauce.  Additional strategies which are vital to safety includes:  no straws, small sips, not eating/drinking during times of increased shortness of breath, taking rest breaks.  ST will follow up briefly next week.     Aspiration Risk  Moderate    Diet Recommendation Regular;Thin liquid   Liquid Administration via: Cup;No straw Medication Administration: Whole meds with puree Supervision: Intermittent supervision to cue for compensatory strategies;Patient able to self feed Compensations: Slow rate;Small sips/bites Postural Changes and/or Swallow Maneuvers: Seated upright 90 degrees;Upright 30-60 min after meal  Other  Recommendations Oral Care Recommendations: Oral care BID   Follow Up Recommendations  Inpatient Rehab    Frequency and Duration min 1 x/week  2 weeks   Pertinent Vitals/Pain WDL      Swallow Study           Oral/Motor/Sensory Function Overall Oral Motor/Sensory Function: Appears within functional limits for tasks assessed   Ice Chips Ice chips: Within functional limits   Thin Liquid Thin Liquid: Impaired Presentation: Cup;Straw Oral Phase Functional Implications: Oral holding;Prolonged oral transit Pharyngeal  Phase Impairments:  (inhalation immediately following swallow, discoordinated)    Nectar Thick Nectar Thick Liquid: Not tested   Honey Thick Honey Thick Liquid: Not tested   Puree Puree: Impaired Presentation: Spoon;Self Fed Pharyngeal Phase Impairments:  (discoordinated)   Solid   GO    Solid: Within functional limits       Orbie Pyo [employer] [phone]  09/22/2013

## 2013-09-22 NOTE — Clinical Documentation Improvement (Signed)
Possible Clinical Conditions?   _______CKD Stage I - GFR > OR = 90 _______CKD Stage II - GFR 60-80 _______CKD Stage III - GFR 30-59 _______CKD Stage IV - GFR 15-29 _______CKD Stage V - GFR < 15 _______Other condition_____________ _______Cannot Clinically determine   Supporting Information:  Per 09/20/13 H&P and 09/21/13 MD progress notes: CKD - creatinine normalizing  GFR Range 49 -53 (09/21/13 - 09/22/13) BUN Range 33-41 (09/21/13 - 09/22/13) Creatine Range  1.32 - 1.40 (09/21/13 - 09/22/13)  Thank You, Serena Colonel ,RN Clinical Documentation Specialist:  [place] [employer] [facility]

## 2013-09-22 NOTE — Progress Notes (Signed)
Transferred in from [facility] room 2 by bed awake and alert.

## 2013-09-22 NOTE — Progress Notes (Signed)
Patient ID: KOHLE WINNER, male   DOB: Feb 02, 1941, 73 y.o.   MRN: [id number] I was notified by critical care medicine that the patient's condition deteriorated and he was transferred to the cardiac unit on June 3. He had a syncopal episode on the rehabilitation unit. He developed recurrent paroxysmal atrial flutter. Increased dyspnea. Suspected element of acute or chronic diastolic heart failure exacerbated by anemia. He was started on diuretics and antiarrhythmics. He has known underlying coronary artery disease status post MI status post bypass surgery status post 2 drug loading stents in the right coronary and the left distal main coronary. No gross changes on his chest radiograph which shows persistent but stable bilateral interstitial infiltrates. Minimal rise in proBNP compare with baseline recorded on May 18 although value still significantly elevated at approximately 4200. Transthoracic echocardiogram with preserved left ventricular function estimated ejection fraction 55-60%. Mildly dilated right ventricle. Mitral and tricuspid regurgitation. Diastolic dysfunction could not be determined on that study. Exam: Blood pressure 82/46, pulse 110, temperature 98.6 F (37 C), temperature source Oral, resp. rate 19, height _0  (1.753 m), weight 119 lb 14.4 oz (54.386 kg), SpO2 98.00%. On 2 liters nasal O2 He is currently dyspneic at rest. He has lost a considerable amount of muscle mass. Good air movement over both lungs which are resonant to percussion throughout. No rales. Irregularly irregular cardiac rhythm. Abdomen soft, scaphoid, no signs of local recurrence of his lymphoma in the right inguinal region. Extremities: Muscle wasting. No edema  Pertinent lab and x-ray data reviewed in Epic.  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 Although his pulmonary status has improved on steroids, his global performance status has not. Current deterioration likely related to acute on chronic diastolic heart failure related to recurrent atrial arrhythmia at a rapid rate.  My concern at this point is that due to his severe deconditioning, we may not get the opportunity to resume a chemotherapy program before we see further progression of his lymphoma. He received a total of 3 cycles of the Brentuximab and had a very rapid and dramatic response. However, we will be limited both by his cardiopulmonary status and the presumed allergic pneumonitis from the Brentuximab in what we can reasonably offer him even if his medical condition stabilizes. I discussed with the patient and one of his daughters at the bedside my impressions above and the fact that we are still on thin ice with respect to his overall medical condition. The daughter was appropriately tearful. My concern at this time is that if he had to be intubated that we would have a difficult if not impossible time getting him off the ventilator. I discussed my impressions with critical care attending Dr. Titus Mould.

## 2013-09-22 NOTE — Progress Notes (Signed)
Name: Willie [name] MRN: [id number] DOB: 05-26-1940    ADMISSION DATE:  09/20/2013 CONSULTATION DATE:  09/04/2013  REFERRING MD :  Franchot Gallo PRIMARY SERVICE: PCCM  CHIEF COMPLAINT:  Dyspnea  BRIEF PATIENT DESCRIPTION: 73 y.o. w PMH of NHL, on chemo, presented 5/18 with severe SOB.  In ED, found to have small 5% right PTX, pneumomediastinum, dependent GGO's.  PCCM was consulted, conservative management recommended. Patient now on inpatient rehabilitation. Pulmonary consult called again for respiratory distress and hypoxemia. Chest X ray compatible with pulmonary edema. Patient improved with diuresis. 6/3 PCCM reconsult for SOB.    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 5/18 - admitted with severe SOB, found to have pneumomediastinum, PTX, and GGO's (unchanged from CT on 08/25/13) 5/20 - Echo Preserved LV function, DD due to a-flutter 5/29 - On inpatient rehab. Developed respiratory distress and hypoxemia. Improved with diuresis.  6/03 - increased SOB, PCCM called. Transfer to ICU. 6/03 - LE Doppler >>neg  6/03 - ECHO >> 55%, mod mr, pa 33 6/04 - Afib w RVR overnight, known PAF  LINES / TUBES: PIV  CULTURES: Blood 5/18 >>> No growth Urine 5/29 > neg Blood 6/3 >>> Urine 6/3 >>> Sputum 6/3 >>>  ANTIBIOTICS: Valtrex 5/19 >>> Vanc 6/3 >>>6/5 Cefepime 6/3 >>>6/5 Levofloxacin 6/4>>>consider stop sate 7 days in total  SUBJECTIVE: slight pos balance, remains culture neg   VITAL SIGNS:  Intake/Output Summary (Last 24 hours) at 09/22/13 1225 Last data filed at 09/22/13 1000  Gross per 24 hour  Intake    953 ml  Output   1130 ml  Net   -177 ml    Temp:  [98.3 F  (36.8 C)-98.6 F (37 C)] 98.6 F (37 C) (06/05 0800) Pulse Rate:  [89-113] 110 (06/05 0935) Resp:  [14-35] 26 (06/05 1100) BP: (81-157)/(46-91) 123/65 mmHg (06/05 1100) SpO2:  [94 %-100 %] 100 % (06/05 1100) Weight:  [54.386 kg (119 lb 14.4 oz)] 54.386 kg (119 lb 14.4 oz) (06/05 0446)  PHYSICAL EXAMINATION: General: elderly male in NAD Neuro: awakens, nonfocal  HEENT: [place]/AT. PERRL Cardiovascular: Tachy, 3/6 PSM.at apex unchanged Lungs: coarse  Abdomen: BS x 4, soft, NT/ND.  Musculoskeletal: No gross deformities, 1+ pitting bilateral lower ext edema.  Skin: Intact, warm, no rashes.   Recent Labs Lab 09/20/13 0513 09/21/13 0125 09/22/13 0434  NA 135* 132* 130*  K 3.6* 3.1* 3.3*  CL 90* 86* 91*  CO2 37* 33* 28  BUN 28* 33* 41*  CREATININE 1.30 1.40* 1.32  GLUCOSE 97 101* 99    Recent Labs Lab 09/20/13 0513 09/21/13 0125 09/22/13 0434  HGB 11.0* 11.2* 9.8*  HCT 32.6* 31.9* 28.2*  WBC 19.7* 19.7* 22.0*  PLT 186 225 160   Dg Chest Port 1 View  09/22/2013   CLINICAL DATA:  assess airspace disease  EXAM: PORTABLE CHEST - 1 VIEW  COMPARISON:  09/21/2013  FINDINGS: Relatively low lung volumes. Persistent perihilar and left lower lung interstitial opacities. Heart size normal. . No effusion. Previous median sternotomy.  IMPRESSION: Little change in asymmetric interstitial opacities left greater than right.   Electronically Signed   By: Arne Cleveland M.D.   On: 09/22/2013 07:45   Dg Chest Port 1 View  09/21/2013   CLINICAL DATA:  Pulmonary edema.  EXAM: PORTABLE CHEST -  1 VIEW  COMPARISON:  09/20/2013  FINDINGS: There are persistent interstitial densities in the left lower lung. Mildly improved aeration in the right lung. Heart size is stable. Patient has median sternotomy wires. Probable biapical lung scarring.  IMPRESSION: Persistent interstitial densities in the left lower chest. Findings probably represent asymmetric edema based on the prior chest radiographs. Infection in  the left lung cannot be excluded and recommend continued follow up.   Electronically Signed   By: Markus Daft M.D.   On: 09/21/2013 07:39      Lab Results  Component Value Date   ESRSEDRATE 33* 09/05/2013   ESRSEDRATE 5 11/01/2012   ESRSEDRATE 6 05/03/2012     Lab Results  Component Value Date   PROBNP 4148.0* 09/20/2013    Lactic Acid, Venous No components found with this basename: lacticacidven   ABG    Component Value Date/Time   PHART 7.534* 09/21/2013 0508   PCO2ART 37.9 09/21/2013 0508   PO2ART 63.4* 09/21/2013 0508   HCO3 32.0* 09/21/2013 0508   TCO2 33.2 09/21/2013 0508   O2SAT 94.4 09/21/2013 0508     ASSESSMENT / PLAN:  PULMONARY A:  Acute respiratory failure edema vs HCAP vs less likely PE Bibasilar 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 Lit review does show a 1-10% incidence of pneumonitis with brentuximab (anti CD 30 agent) Small right apical PTX , Pneumomediastinum - unclear etiology, resolved. Respiratory alkalosis Neg doppler, unlikely PE  P:   BiPAP PRN for increased WOB, not needed Follow CXR in am , especially with bolus PRN BD's Doppler BLE - neg Pulmonary hygiene - flutter, mobilize, pt active Prednisone 60 mg, seems to be improving clinically with this approach, will consider reduction in 48 hrs with clinical progress D/w family on going to consider full ncb  CARDIOVASCULAR A:  PAF - events noted overnight 6/4 CAD On effient MR  P:  Poor candidate for anticoagulation per cards.  2D echo reviewed Continue Effient Lasix holding, likely was overdiuresed PO lopressor 12.5 BID (on 25 QD at home) + PRN IV Lopressor tele  RENAL A:   CKD - creatinine normalizing Metabolic Alkalosis - overdiuresis hypoK  P:  Monitor BMP If BOLUS improves BP, will add maintenance Hematuria, trauma?, will repeat UA Allow pos balance, bolus  k supp  GASTROINTESTINAL A:   GI PPX  P:   PPI Advance diet as tolerated to heart  healthy Aspiration precautions  Liquids Megace when out of ICU and BP , ok , high risk megace  HEMATOLOGIC A:   Anemia - chronic, improving Non Hodgkin Lymphoma - reportedly in remission after recent chemo VTE ppx  P: No chemoprophylaxis d/t recent hematuria. And slight drop plat SCDS Follow CBC with diff in am  Updated by oncology  INFECTIOUS A:   Leukocytosis on steroids ?HCAP unclear  P:   Pct with immunosuppressed host, PCT not so helpful Will dc vanc, cef, done Add stop date levo 7-8 days  ENDOCRINE A:   No acute issues    P:   Follow CBG If hypoglycemia, then add home megace  NEUROLOGIC A:  No acute issues  P:  PT   GLOBAL: Family ok for short term intubation only but no CPR / Cardioversion  However, unlikely to improve outcome or get extubated, awake final answer about consideration FULL NCB   I have personally obtained a history, examined the patient, evaluated laboratory and imaging results, formulated the assessment and plan and placed orders.  Lavon Paganini.  Titus Mould, MD, [place] Pgr: [facility]

## 2013-09-22 NOTE — Clinical Documentation Improvement (Signed)
Possible Clinical Conditions?   Hyponatremia                                Other Condition___________________                 Cannot Clinically Determine_________   Supporting Information: Sodium Range 130 - 132 (09/21/13 - 09/22/13)  Thank You, Serena Colonel ,RN Clinical Documentation Specialist:  [employer]

## 2013-09-22 NOTE — Progress Notes (Signed)
Agree with holding therapies until pt is medically appropriate. Will continue to follow and evaluate when appropriate.   Jolyn Lent, PT, DPT Acute Rehabilitation Services Pager: [phone]

## 2013-09-22 NOTE — Progress Notes (Signed)
[facility] ICU Electrolyte Replacement Protocol  Patient Name: Willie [name] DOB: July 02, 1940 MRN: [id number]  Date of Service  09/22/2013   HPI/Events of Note    Recent Labs Lab 09/17/13 1139 09/18/13 0600 09/20/13 0513 09/21/13 0125 09/22/13 0434  NA 135* 135* 135* 132* 130*  K 3.2* 3.5* 3.6* 3.1* 3.3*  CL 89* 89* 90* 86* 91*  CO2 39* 36* 37* 33* 28  GLUCOSE 106* 107* 97 101* 99  BUN 43* 35* 28* 33* 41*  CREATININE 1.86* 1.55* 1.30 1.40* 1.32  CALCIUM 9.1 9.4 8.9 8.8 8.4  MG  --   --   --  1.8 1.7  PHOS  --   --   --  2.3  --     Estimated Creatinine Clearance: 39.5 ml/min (by C-G formula based on Cr of 1.32).  Intake/Output     06/04 0701 - 06/05 0700   P.O. 840   I.V. (mL/kg) 400 (7.4)   Total Intake(mL/kg) 1240 (22.8)   Urine (mL/kg/hr) 630 (0.5)   Total Output 630   Net +610        - I/O DETAILED x24h    Total I/O In: 330 [P.O.:240; I.V.:90] Out: 30 [Urine:30] - I/O THIS SHIFT    ASSESSMENT   eICURN Interventions  K+ 3.3 Electrolyte protocol criteria met. Lab values replaced per protocol. MD notified.   ASSESSMENT: MAJOR ELECTROLYTE    Lorene Dy 09/22/2013, 6:02 AM

## 2013-09-22 NOTE — Progress Notes (Signed)
OT Cancellation Note/ PT note  Patient Details Name: Willie [name] MRN: [id number] DOB: December 26, 1940   Cancelled Treatment:    Reason Eval/Treat Not Completed: Patient not medically ready. Pt currently with tachycardia noted with sustain HR > 120, BP 96/ 57 with RN reporting low MAP throughout the this AM and RR 31- 40. Pt supine in bed resting with elevated HR and RR. Therapy to hold at this time until patient able to better tolerate evaluation.  Peri Maris Pager: [phone]  09/22/2013, 10:37 AM

## 2013-09-23 ENCOUNTER — Inpatient Hospital Stay (HOSPITAL_COMMUNITY): Payer: Medicare Other

## 2013-09-23 DIAGNOSIS — J9383 Other pneumothorax: Secondary | ICD-10-CM

## 2013-09-23 DIAGNOSIS — C8589 Other specified types of non-Hodgkin lymphoma, extranodal and solid organ sites: Secondary | ICD-10-CM

## 2013-09-23 DIAGNOSIS — J982 Interstitial emphysema: Secondary | ICD-10-CM

## 2013-09-23 DIAGNOSIS — R5381 Other malaise: Secondary | ICD-10-CM

## 2013-09-23 LAB — CBC WITH DIFFERENTIAL/PLATELET
BASOS ABS: 0 10*3/uL (ref 0.0–0.1)
BASOS PCT: 0 % (ref 0–1)
EOS ABS: 0.2 10*3/uL (ref 0.0–0.7)
EOS PCT: 1 % (ref 0–5)
HCT: 26 % — ABNORMAL LOW (ref 39.0–52.0)
Hemoglobin: 9 g/dL — ABNORMAL LOW (ref 13.0–17.0)
Lymphocytes Relative: 3 % — ABNORMAL LOW (ref 12–46)
Lymphs Abs: 0.6 10*3/uL — ABNORMAL LOW (ref 0.7–4.0)
MCH: 33.3 pg (ref 26.0–34.0)
MCHC: 34.6 g/dL (ref 30.0–36.0)
MCV: 96.3 fL (ref 78.0–100.0)
MONO ABS: 0.6 10*3/uL (ref 0.1–1.0)
Monocytes Relative: 3 % (ref 3–12)
NEUTROS ABS: 20.6 10*3/uL — AB (ref 1.7–7.7)
Neutrophils Relative %: 93 % — ABNORMAL HIGH (ref 43–77)
Platelets: 167 10*3/uL (ref 150–400)
RBC: 2.7 MIL/uL — ABNORMAL LOW (ref 4.22–5.81)
RDW: 19.9 % — AB (ref 11.5–15.5)
WBC: 22 10*3/uL — ABNORMAL HIGH (ref 4.0–10.5)

## 2013-09-23 LAB — COMPREHENSIVE METABOLIC PANEL
ALT: 34 U/L (ref 0–53)
AST: 34 U/L (ref 0–37)
Albumin: 2.4 g/dL — ABNORMAL LOW (ref 3.5–5.2)
Alkaline Phosphatase: 86 U/L (ref 39–117)
BUN: 41 mg/dL — ABNORMAL HIGH (ref 6–23)
CO2: 26 mEq/L (ref 19–32)
Calcium: 8.2 mg/dL — ABNORMAL LOW (ref 8.4–10.5)
Chloride: 94 mEq/L — ABNORMAL LOW (ref 96–112)
Creatinine, Ser: 1.2 mg/dL (ref 0.50–1.35)
GFR calc non Af Amer: 59 mL/min — ABNORMAL LOW (ref >90)
GFR, EST AFRICAN AMERICAN: 68 mL/min — AB (ref >90)
GLUCOSE: 95 mg/dL (ref 70–99)
Potassium: 3.7 mEq/L (ref 3.7–5.3)
Sodium: 130 mEq/L — ABNORMAL LOW (ref 137–147)
TOTAL PROTEIN: 4.7 g/dL — AB (ref 6.0–8.3)
Total Bilirubin: 0.5 mg/dL (ref 0.3–1.2)

## 2013-09-23 MED ORDER — MORPHINE SULFATE 2 MG/ML IJ SOLN
INTRAMUSCULAR | Status: AC
  Filled 2013-09-23: qty 1

## 2013-09-23 MED ORDER — MORPHINE SULFATE 2 MG/ML IJ SOLN
2.0000 mg | Freq: Once | INTRAMUSCULAR | Status: AC
  Administered 2013-09-23: 2 mg via INTRAVENOUS
  Filled 2013-09-23: qty 1

## 2013-09-23 MED ORDER — MORPHINE SULFATE 2 MG/ML IJ SOLN
2.0000 mg | Freq: Once | INTRAMUSCULAR | Status: AC
  Administered 2013-09-23: 2 mg via INTRAVENOUS

## 2013-09-23 NOTE — Progress Notes (Signed)
PULMONARY / CRITICAL CARE MEDICINE  Name: Willie [name] MRN: [id number] DOB: 1940-06-26    ADMISSION DATE:  09/20/2013 CONSULTATION DATE:  09/04/2013  REFERRING MD :  Franchot Gallo PRIMARY SERVICE: PCCM  CHIEF COMPLAINT:  Dyspnea  BRIEF PATIENT DESCRIPTION: 73 yo with NHL on chemo presented 5/18 with severe dyspnea.  In ED found to have small 5% right pneumothorax, pneumomediastinum, dependent GGO's.  PCCM was consulted, conservative management recommended.  Transferred to CIR. PCCM reconsulted for respiratory distress and hypoxemia. CXR was compatible with pulmonary edema. Patient improved with diuresis. 6/3 PCCM re consulted again  for SOB.   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 Developed respiratory distress and hypoxemia. Improved with diuresis.  6/03  Increased SOB, PCCM called. Transfer to ICU. 6/03  LE Doppler >>> neg  6/03  TTE >> 55%, mod mr, pa 33  LINES / TUBES:  CULTURES: Blood 5/18 >>> neg Urine 5/29 > neg Blood 6/3 >>> Urine 6/3 >>> neg  ANTIBIOTICS: Valtrex 5/19 >>> Vanc 6/3 >>> 6/5 Cefepime 6/3 >>> 6/5 Levofloxacin 6/4 >>>  SUBJECTIVE: No acute overnight events  VITAL SIGNS:  Temp:  [96.7 F (35.9 C)-98.9 F (37.2 C)] 98 F (36.7 C) (06/06 0800) Pulse Rate:  [97-120] 117 (06/06 0413) Resp:  [13-34] 22 (06/06 0800) BP: (109-167)/(56-139) 110/56 mmHg (06/06 0800) SpO2:  [91 %-100 %] 96 % (06/06 0800) Weight:  [56 kg (123 lb 7.3 oz)] 56 kg (123 lb 7.3 oz) (06/06 0425)  I/O: Intake/Output     06/05 0701 - 06/06 0700 06/06 0701 - 06/07 0700   P.O. 360    I.V. (mL/kg) 213 (3.8)    IV Piggyback  500    Total Intake(mL/kg) 1073 (19.2)    Urine (mL/kg/hr) 1250 (0.9)    Total Output 1250     Net -177            PHYSICAL EXAMINATION: General: No distress, looks pale / chronically ill Neuro: Somnolent, waking up to stimulation HEENT: Dry membranes Cardiovascular: Irregular, no murmurs Lungs: Bilateral diminished air entry Abdomen: Soft, bowel sounds present Musculoskeletal: trace edema Skin: No rash  LABS: CBC  Recent Labs Lab 09/21/13 0125 09/22/13 0434 09/23/13 0527  WBC 19.7* 22.0* 22.0*  HGB 11.2* 9.8* 9.0*  HCT 31.9* 28.2* 26.0*  PLT 225 160 167   Coag's No results found for this basename: APTT, INR,  in the last 168 hours BMET  Recent Labs Lab 09/21/13 0125 09/22/13 0434 09/23/13 0527  NA 132* 130* 130*  K 3.1* 3.3* 3.7  CL 86* 91* 94*  CO2 33* 28 26  BUN 33* 41* 41*  CREATININE 1.40* 1.32 1.20  GLUCOSE 101* 99 95   Electrolytes  Recent Labs Lab 09/21/13 0125 09/22/13 0434 09/23/13 0527  CALCIUM 8.8 8.4 8.2*  MG 1.8 1.7  --   PHOS 2.3  --   --    Sepsis Markers  Recent Labs Lab 09/20/13 1135 09/20/13 1230 09/21/13 0125 09/22/13 0434  LATICACIDVEN  --  1.5  --   --   PROCALCITON 0.20  --  0.20 0.14   ABG  Recent Labs Lab 09/20/13 1103 09/21/13 0508  PHART 7.539* 7.534*  PCO2ART 45.3* 37.9  PO2ART 60.6* 63.4*   Liver Enzymes  Recent Labs Lab 09/23/13 0527  AST 34  ALT 34  ALKPHOS 86  BILITOT 0.5  ALBUMIN 2.4*   Cardiac Enzymes  Recent Labs Lab 09/20/13 1230 09/21/13 0125  TROPONINI <0.30 <0.30  PROBNP 4148.0*  --    Glucose No results found for this basename: GLUCAP,  in the last 168 hours  IMAGING: Dg Chest Port 1 View  09/23/2013   CLINICAL DATA:  Edema  EXAM: PORTABLE CHEST - 1 VIEW  COMPARISON:  09/22/2013  FINDINGS: Mild patchy right perihilar/ bilateral lower lobe opacities, possibly reflecting interstitial edema, although multifocal pneumonia remains possible. This appearance is similar to the most  recent prior studies.  No pleural effusion or pneumothorax.  The heart is top-normal in size. Postsurgical changes related to prior CABG.  IMPRESSION: Stable mild patchy right perihilar/bilateral lower lobe opacities, possibly reflecting interstitial edema, although multifocal pneumonia remains possible.   Electronically Signed   By: Julian Hy M.D.   On: 09/23/2013 07:14   Dg Chest Port 1 View  09/22/2013   CLINICAL DATA:  assess airspace disease  EXAM: PORTABLE CHEST - 1 VIEW  COMPARISON:  09/21/2013  FINDINGS: Relatively low lung volumes. Persistent perihilar and left lower lung interstitial opacities. Heart size normal. . No effusion. Previous median sternotomy.  IMPRESSION: Little change in asymmetric interstitial opacities left greater than right.   Electronically Signed   By: Arne Cleveland M.D.   On: 09/22/2013 07:45   ASSESSMENT / PLAN:  PULMONARY A:  Acute on chronic respiratory failure Pulmonary edema, improved with diuresis Bibasilar pneumonitis, possibly drug related Small right apical pneumothorax, pneumomediatinum Respiratory alkalosis P:   Goal SpO2>92 Supplemental oxygen PRN Prednisone 60  CARDIOVASCULAR A:  PAF, controlled rate CAR MR P:  Poor candidate for anticoagulation per cardiology Effient ASA, Lipitor, Metoprolol  RENAL A:   CKD P:  Trend BMP NS$RemoveBef'@50'[id number]$   GASTROINTESTINAL A:   Protein calorie malnutrition GI Px is not required P:   Diet D/c Protonix  HEMATOLOGIC A:   Anemia NHL, reportedly in remission after recent chemo VTE ppx P: Trend CBC SCD  INFECTIOUS A:   Leukocytosis secondary to steroids No overt infection P:   Levaquin Valtrex as preadmission  ENDOCRINE A:   No acute issues   P:   No intervention required   NEUROLOGIC A:  Severe deconditioning Acute encephalopathy /  delirium P: Doubt can participate in PT / OT  Discussed goals of care with the daughter; she agrees that hospice referal / DNR is appropriate.   She will communicate that to patient's wife to whom I am planning to speak on 6/7.  I have personally obtained history, examined patient, evaluated and interpreted laboratory and imaging results, reviewed medical records, formulated assessment / plan and placed orders.  Doree Fudge, MD Pulmonary and [place] Pager: [phone]  09/23/2013, 10:36 AM

## 2013-09-23 NOTE — Progress Notes (Signed)
Called to pt's room with c/o labored breathing, uncomfortable, not able to rest. Repositioned pt and offered to call md about morphine or bipap.  Pt and wife refused.  Called md and Md spoke with wife and wife was willing to let us try morphine. New orders received and morphine 2mg  iv given.  Vs stable will continue to monitor. Wynona Canes

## 2013-09-23 NOTE — Progress Notes (Signed)
Bladder re-scanned for >200cc.  In and out cathed for 1050 cc clear yellow urine.  Will continue to monitor.

## 2013-09-23 NOTE — Progress Notes (Signed)
Physical Therapy Discharge Patient Details Name: RHEN KAWECKI MRN: [id number] DOB: 24-Aug-1940 Today's Date: 09/23/2013 Time:  -     Patient discharged from PT services secondary to Per pt's RN Sunday Spillers, pt. is currently not appropriate for PT.  PT will sign off.  If pt. becomes more medically able to tolerate PT, please reorder .  Thank you!Rudean Haskell PT Acute Rehab Services [phone] [place] [phone]   09/23/2013, 10:26 AM

## 2013-09-24 DIAGNOSIS — I059 Rheumatic mitral valve disease, unspecified: Secondary | ICD-10-CM

## 2013-09-24 LAB — CBC
HEMATOCRIT: 22.4 % — AB (ref 39.0–52.0)
HEMOGLOBIN: 7.7 g/dL — AB (ref 13.0–17.0)
MCH: 32.5 pg (ref 26.0–34.0)
MCHC: 34.4 g/dL (ref 30.0–36.0)
MCV: 94.5 fL (ref 78.0–100.0)
Platelets: 148 10*3/uL — ABNORMAL LOW (ref 150–400)
RBC: 2.37 MIL/uL — ABNORMAL LOW (ref 4.22–5.81)
RDW: 19.9 % — ABNORMAL HIGH (ref 11.5–15.5)
WBC: 19.8 10*3/uL — ABNORMAL HIGH (ref 4.0–10.5)

## 2013-09-24 LAB — BASIC METABOLIC PANEL
BUN: 44 mg/dL — AB (ref 6–23)
CALCIUM: 8.4 mg/dL (ref 8.4–10.5)
CO2: 27 mEq/L (ref 19–32)
Chloride: 94 mEq/L — ABNORMAL LOW (ref 96–112)
Creatinine, Ser: 1.24 mg/dL (ref 0.50–1.35)
GFR calc Af Amer: 66 mL/min — ABNORMAL LOW (ref >90)
GFR calc non Af Amer: 57 mL/min — ABNORMAL LOW (ref >90)
GLUCOSE: 114 mg/dL — AB (ref 70–99)
Potassium: 4 mEq/L (ref 3.7–5.3)
Sodium: 130 mEq/L — ABNORMAL LOW (ref 137–147)

## 2013-09-24 MED ORDER — METHYLPREDNISOLONE SODIUM SUCC 40 MG IJ SOLR
40.0000 mg | Freq: Two times a day (BID) | INTRAMUSCULAR | Status: DC
  Administered 2013-09-24 – 2013-09-25 (×2): 40 mg via INTRAVENOUS
  Filled 2013-09-24 (×4): qty 1

## 2013-09-24 MED ORDER — METOPROLOL TARTRATE 1 MG/ML IV SOLN
5.0000 mg | Freq: Four times a day (QID) | INTRAVENOUS | Status: DC
  Administered 2013-09-24 – 2013-09-25 (×5): 5 mg via INTRAVENOUS
  Filled 2013-09-24 (×8): qty 5

## 2013-09-24 NOTE — Consult Note (Addendum)
Referral received on pt for Hospice Home at [facility]: TC to speak to pt's wife in reference to set up an appointment time with no answer. Left a message for her to return our call. Will continue to follow.Webb Silversmith RN [phone]   [name] called back and a time was set up to meet with pt 's family in the room at 1030am Monday morning. We will hopefully transport pt to the [facility] if the family is in agreement with the hospice philosphy and we have a bed available.   Thank you for this referral and we will continue to follow. Webb Silversmith RN

## 2013-09-24 NOTE — Progress Notes (Addendum)
PULMONARY / CRITICAL CARE MEDICINE  Name: LANGLEY INGALLS MRN: [id number] DOB: 05-10-1940    ADMISSION DATE:  09/20/2013 CONSULTATION DATE:  09/04/2013  REFERRING MD :  Franchot Gallo PRIMARY SERVICE: PCCM  CHIEF COMPLAINT:  Dyspnea  BRIEF PATIENT DESCRIPTION: 73 yo with NHL on chemo presented 5/18 with severe dyspnea.  In ED found to have small 5% right pneumothorax, pneumomediastinum, dependent GGO's.  PCCM was consulted, conservative management recommended.  Transferred to CIR. PCCM reconsulted for respiratory distress and hypoxemia. CXR was compatible with pulmonary edema. Patient improved with diuresis. 6/3 PCCM re consulted again  for SOB.   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 Developed respiratory distress and hypoxemia. Improved with diuresis.  6/03  Increased SOB, PCCM called. Transfer to ICU. 6/03  LE Doppler >>> neg  6/03  TTE >> 55%, mod mr, pa 33 6/07  Goals of care meeting ( wife, son ) >>> DNR, continue medical care, transition to hospice  LINES / TUBES:  CULTURES: Blood 5/18 >>> neg Urine 5/29 > neg Blood 6/3 >>> Urine 6/3 >>> neg  ANTIBIOTICS: Valtrex 5/19 >>> Vanc 6/3 >>> 6/5 Cefepime 6/3 >>> 6/5 Levofloxacin 6/4 >>>  INTERVAL HISTORY: Foley placed for urine retention.  Family meeting.  VITAL SIGNS:  Temp:  [97.9 F (36.6 C)-98.4 F (36.9 C)] 98 F (36.7 C) (06/07 0826) Pulse Rate:  [102-116] 102 (06/07 0300) Resp:  [16-28] 21 (06/07 0826) BP: (117-131)/(56-68) 122/68 mmHg (06/07 0826) SpO2:  [95 %-99 %] 95 % (06/07 0826) Weight:  [57 kg (125 lb 10.6 oz)] 57 kg (125 lb 10.6 oz) (06/07  0300)  I/O: Intake/Output     06/06 0701 - 06/07 0700 06/07 0701 - 06/08 0700   P.O. 210 60   I.V. (mL/kg) 60 (1.1) 200 (3.5)   IV Piggyback 300    Total Intake(mL/kg) 570 (10) 260 (4.6)   Urine (mL/kg/hr) 1050 (0.8)    Total Output 1050     Net -480 +260          PHYSICAL EXAMINATION: General: Appears comfortable Neuro: Delirious  HEENT: Dry membranes Cardiovascular: Irregular, no murmurs Lungs: Bilateral diminished air entry Abdomen: Soft, bowel sounds present Musculoskeletal: Trace edema Skin: No rash  LABS: CBC  Recent Labs Lab 09/22/13 0434 09/23/13 0527 09/24/13 0245  WBC 22.0* 22.0* 19.8*  HGB 9.8* 9.0* 7.7*  HCT 28.2* 26.0* 22.4*  PLT 160 167 148*   Coag's No results found for this basename: APTT, INR,  in the last 168 hours BMET  Recent Labs Lab 09/22/13 0434 09/23/13 0527 09/24/13 0245  NA 130* 130* 130*  K 3.3* 3.7 4.0  CL 91* 94* 94*  CO2 $Re'28 26 27  'eFX$ BUN 41* 41* 44*  CREATININE 1.32 1.20 1.24  GLUCOSE 99 95 114*   Electrolytes  Recent Labs Lab 09/21/13 0125 09/22/13 0434 09/23/13 0527 09/24/13 0245  CALCIUM 8.8 8.4 8.2* 8.4  MG 1.8 1.7  --   --   PHOS 2.3  --   --   --    Sepsis Markers  Recent Labs Lab 09/20/13 1135 09/20/13 1230 09/21/13 0125 09/22/13  8832  LATICACIDVEN  --  1.5  --   --   PROCALCITON 0.20  --  0.20 0.14   ABG  Recent Labs Lab 09/20/13 1103 09/21/13 0508  PHART 7.539* 7.534*  PCO2ART 45.3* 37.9  PO2ART 60.6* 63.4*   Liver Enzymes  Recent Labs Lab 09/23/13 0527  AST 34  ALT 34  ALKPHOS 86  BILITOT 0.5  ALBUMIN 2.4*   Cardiac Enzymes  Recent Labs Lab 09/20/13 1230 09/21/13 0125  TROPONINI <0.30 <0.30  PROBNP 4148.0*  --    Glucose No results found for this basename: GLUCAP,  in the last 168 hours  IMAGING: Dg Chest Port 1 View  09/23/2013   CLINICAL DATA:  Edema  EXAM: PORTABLE CHEST - 1 VIEW  COMPARISON:  09/22/2013  FINDINGS: Mild patchy right perihilar/ bilateral lower lobe  opacities, possibly reflecting interstitial edema, although multifocal pneumonia remains possible. This appearance is similar to the most recent prior studies.  No pleural effusion or pneumothorax.  The heart is top-normal in size. Postsurgical changes related to prior CABG.  IMPRESSION: Stable mild patchy right perihilar/bilateral lower lobe opacities, possibly reflecting interstitial edema, although multifocal pneumonia remains possible.   Electronically Signed   By: Julian Hy M.D.   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 P:  Poor candidate for anticoagulation per cardiology Effient Hold ASA, Lipitor as NPO Change Metoprolol to 5 mg IV q6h DNR ( no CPR, invasive lines, defibrillation )  RENAL A:   CKD P:  Trend BMP NS$RemoveBef'@50'OcsgBTkymo$   GASTROINTESTINAL A:   Protein calorie malnutrition GI Px is not required At risk for aspiration P:   NPO  HEMATOLOGIC A:   Anemia NHL, reportedly in remission after recent chemo VTE ppx P: Trend CBC SCD  INFECTIOUS A:   Leukocytosis secondary to steroids No overt infection P:   Levaquin Valtrex as preadmission  ENDOCRINE A:   No acute issues   P:   No intervention required   NEUROLOGIC A:  Severe deconditioning Acute encephalopathy /  delirium P: Doubt can participate in PT / OT  Extensive discussion with family ( wife, son ) about present condition, prognosis and goals of care.  The consensus is to focus on comfort, but continue non-invasive medical care.  Family  clearly do not think CPR, intubation, invasive lines and defibrillation is appropriate.  They want to  talk to social work about inpatient / outpatient hospice placement. Family requested to limit transfers ( location, service ) until placed in hospice ir discharged home.  Will keep on 2900 under PCCM for now.  DNR order placed.  SW contacted.  RN is aware of plans.  I have personally obtained history, examined patient, evaluated and interpreted laboratory and imaging results, reviewed medical records, formulated assessment / plan and placed orders.  Doree Fudge, MD Pulmonary and [place] Pager: [phone]  09/24/2013, 10:26 AM

## 2013-09-24 NOTE — Clinical Social Work Psychosocial (Signed)
Clinical Social Work Department BRIEF PSYCHOSOCIAL ASSESSMENT 09/24/2013  Patient:  Willie [name], Willie [name]     Account Number:  [id number]     Admit date:  09/20/2013  Clinical Social Worker:  Hubert Azure  Date/Time:  09/24/2013 04:37 PM  Referred by:  Physician  Date Referred:  09/24/2013 Referred for  Residential hospice placement   Other Referral:   Interview type:  Family Other interview type:   Patient's wife and son were present at bedside.    PSYCHOSOCIAL DATA Living Status:  WIFE Admitted from facility:   Level of care:   Primary support name:  Yohan Samons ([phone]) Primary support relationship to patient:  SPOUSE Degree of support available:   Good. Patient's wife has been providing primary care for patient.    CURRENT CONCERNS Current Concerns  Post-Acute Placement   Other Concerns:    SOCIAL WORK ASSESSMENT / PLAN CSW met with patient's wife and son. CSW introduced self and explained role. CSW discussed hospice placement with patient's wife. CSW explained home with hospice versus residential hospice. Patient's wife stated she preferred residential hospice as she would not have the support she needed to care for patient at home. CSW provided wife with list of residential hospice facilities.  Patient's wife listed preference for [facility]. Patient's son asked several questions and CSW answered questions appropriately. Patient's wife additionally asked about measures of comfort care. Wife wanted to know who she would speak about comfort care goals. CSW informed patient's wife she could speak with Palliative Care.   Assessment/plan status:  Information/Referral to [employer] Other assessment/ plan:   CSW to make referral to [place]. CSW to make RN aware of Palliative Care consult.   Information/referral to community resources:    PATIENT'S/FAMILY'S RESPONSE TO PLAN OF CARE: Patient's wife and son thanked CSW for  explaining hospice and assisting them in making a more informed decision.   [address], [place] Weekend Clinical Social Worker [phone]

## 2013-09-24 NOTE — Clinical Social Work Note (Signed)
CSW contacted [facility] and made referral. [place] to follow-up with patient's wife. CSW to continue to follow as needs arise.  [place], [place] Weekend Clinical Social Worker [phone]

## 2013-09-25 MED ORDER — LEVOFLOXACIN 750 MG PO TABS: 750.0000 mg | ORAL_TABLET | Freq: Every day | ORAL | Status: AC

## 2013-09-25 MED ORDER — MORPHINE SULFATE (CONCENTRATE) 20 MG/ML PO SOLN: 10.0000 mg | ORAL | Status: AC | PRN

## 2013-09-25 MED ORDER — MORPHINE SULFATE 2 MG/ML IJ SOLN
1.0000 mg | INTRAMUSCULAR | Status: DC | PRN
  Administered 2013-09-25 (×2): 2 mg via INTRAVENOUS
  Filled 2013-09-25 (×2): qty 1

## 2013-09-25 MED ORDER — MORPHINE SULFATE (CONCENTRATE) 20 MG/ML PO SOLN: 10.0000 mg | ORAL | Status: DC | PRN

## 2013-09-25 MED ORDER — DEXTROSE-NACL 5-0.45 % IV SOLN
INTRAVENOUS | Status: DC
  Administered 2013-09-25: 11:00:00 via INTRAVENOUS

## 2013-09-25 NOTE — Progress Notes (Signed)
[place] Progress Note Patient Name: Willie [name] DOB: 1941/04/20 MRN: [id number]  Date of Service  09/25/2013   HPI/Events of Note   Family requesting morphine for dyspnea relief  eICU Interventions  Morphine prn   Intervention Category Intermediate Interventions: Respiratory distress - evaluation and management  Brand Males 09/25/2013, 2:11 AM

## 2013-09-25 NOTE — Progress Notes (Signed)
PULMONARY / CRITICAL CARE MEDICINE  Name: Willie [name] MRN: [id number] DOB: 1940/08/27    ADMISSION DATE:  09/20/2013 CONSULTATION DATE:  09/04/2013  REFERRING MD :  Franchot Gallo PRIMARY SERVICE: PCCM  CHIEF COMPLAINT:  Dyspnea  BRIEF PATIENT DESCRIPTION: 73 yo with NHL on chemo presented 5/18 with severe dyspnea.  In ED found to have small 5% right pneumothorax, pneumomediastinum, dependent GGO's.  PCCM was consulted, conservative management recommended.  Transferred to CIR. PCCM reconsulted for respiratory distress and hypoxemia. CXR was compatible with pulmonary edema. Patient improved with diuresis. 6/3 PCCM re consulted again  for SOB.   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 Developed respiratory distress and hypoxemia. Improved with diuresis.  6/03  Increased SOB, PCCM called. Transfer to ICU. 6/03  LE Doppler >>> neg  6/03  TTE >> 55%, mod mr, pa 33 6/07  Goals of care meeting ( wife, son ) >>> DNR, continue medical care, transition to hospice 6/08  Pending hospice / Palliative Care visit  LINES / TUBES:  CULTURES: Blood 5/18 >>> neg Urine 5/29 > neg Blood 6/3 >>> Urine 6/3 >>> neg  ANTIBIOTICS: Valtrex 5/19 >>> Vanc 6/3 >>> 6/5 Cefepime 6/3 >>> 6/5 Levofloxacin 6/4 >>>  INTERVAL HISTORY: Morphine PRN for dyspnea  VITAL SIGNS:  Temp:  [98.4 F (36.9 C)-98.6 F (37 C)] 98.6 F (37 C) (06/08 0757) Pulse Rate:  [82-85] 82 (06/07 2300) Resp:  [12-31] 19 (06/08 0757) BP: (91-137)/(55-77) 106/55 mmHg (06/08 0757) SpO2:  [96 %-100 %] 100 % (06/08 0757) Weight:  [54.4 kg (119 lb 14.9 oz)] 54.4 kg (119 lb 14.9  oz) (06/08 0500)  I/O: Intake/Output     06/07 0701 - 06/08 0700 06/08 0701 - 06/09 0700   P.O. 360    I.V. (mL/kg) 420 (7.7) 20 (0.4)   IV Piggyback     Total Intake(mL/kg) 780 (14.3) 20 (0.4)   Urine (mL/kg/hr) 1100 (0.8)    Total Output 1100     Net -320 +20          PHYSICAL EXAMINATION: General: Appears to be in no distress, irregular breathing pattern Neuro: Somnolent but wakes up spontaneously HEENT: Dry membranes Cardiovascular: Irregular, no murmurs Lungs: Bilateral diminished air entry, no added sounds Abdomen: Soft, bowel sounds present Musculoskeletal: Trace edema Skin: No rash  LABS: CBC  Recent Labs Lab 09/22/13 0434 09/23/13 0527 09/24/13 0245  WBC 22.0* 22.0* 19.8*  HGB 9.8* 9.0* 7.7*  HCT 28.2* 26.0* 22.4*  PLT 160 167 148*   Coag's No results found for this basename: APTT, INR,  in the last 168 hours BMET  Recent Labs Lab 09/22/13 0434 09/23/13 0527 09/24/13 0245  NA 130* 130* 130*  K 3.3* 3.7 4.0  CL 91* 94* 94*  CO2 $Re'28 26 27  'hBC$ BUN 41* 41* 44*  CREATININE 1.32 1.20 1.24  GLUCOSE 99 95 114*   Electrolytes  Recent Labs Lab 09/21/13 0125 09/22/13 0434 09/23/13 0527 09/24/13 0245  CALCIUM 8.8 8.4 8.2* 8.4  MG 1.8 1.7  --   --   PHOS 2.3  --   --   --  Sepsis Markers  Recent Labs Lab 09/20/13 1135 09/20/13 1230 09/21/13 0125 09/22/13 0434  LATICACIDVEN  --  1.5  --   --   PROCALCITON 0.20  --  0.20 0.14   ABG  Recent Labs Lab 09/20/13 1103 09/21/13 0508  PHART 7.539* 7.534*  PCO2ART 45.3* 37.9  PO2ART 60.6* 63.4*   Liver Enzymes  Recent Labs Lab 09/23/13 0527  AST 34  ALT 34  ALKPHOS 86  BILITOT 0.5  ALBUMIN 2.4*   Cardiac Enzymes  Recent Labs Lab 09/20/13 1230 09/21/13 0125  TROPONINI <0.30 <0.30  PROBNP 4148.0*  --    Glucose No results found for this basename: GLUCAP,  in the last 168 hours  IMAGING: No results found. 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 P:  Poor candidate for anticoagulation per cardiology Hold ASA, Lipitor, Effient as NPO Metoprolol to 5 mg IV q6h DNR ( no CPR, invasive lines, defibrillation )  RENAL A:   CKD P:  Trend BMP Change IVF to D5 1/2 NS @ 100 to avoid hypoglycemia  GASTROINTESTINAL A:   Protein calorie malnutrition GI Px is not required At risk for aspiration P:   NPO Would recommend against TF placement  HEMATOLOGIC A:   Anemia NHL, reportedly in remission after recent chemo VTE ppx P: Trend CBC SCD  INFECTIOUS A:   Leukocytosis secondary to steroids No overt infection P:   Levaquin Valtrex on hold as NPO  ENDOCRINE A:   No acute issues   P:   No intervention required   NEUROLOGIC A:  Severe deconditioning Acute encephalopathy /  delirium P: Doubt can participate in PT / OT Hospice evaluation Palliative Care consult  I have personally obtained history, examined patient, evaluated and interpreted laboratory and imaging results, reviewed medical records, formulated assessment / plan and placed orders.  Doree Fudge, MD Pulmonary and [place] Pager: [phone]  09/25/2013, 9:47 AM

## 2013-09-25 NOTE — Clinical Social Work Note (Signed)
Patient discharged to [facility] today. CSW facilitated transport to facility via ambulance.  Karter Hellmer Givens, MSW, LCSW [phone]

## 2013-09-25 NOTE — Discharge Summary (Signed)
Doree Fudge, MD Pulmonary and [place] Pager: [phone]

## 2013-09-25 NOTE — Discharge Summary (Signed)
Physician Discharge Summary       Patient ID: EUGUNE SINE MRN: [id number] DOB/AGE: 1940/06/12 73 y.o.  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 Was in remission, but had relapse in 2015. Received chemo (brentuximab) and was again told to be in remission with one chemo session remaining when he was admitted to [facility] for SOB 09/04/2013. He was found to have pneumomediastinum, pneumothorax, and ground glass opacities (attributed to chemo by PCP) on admission CT scan. He was admitted to ICU. Pneumothorax resolved spontaneously and he was given antibiotic therapy with concern for CAP given abnormal CXR. He was assessed for atypical infections as he was immunocompromised s/p chemotherapy, but none were found to be present. He had been improving and on 5/26 was transferred to inpatient rehab services. While there he had several episodes of SOB for which he was diuresed with improvement of symptoms. 6/3 he developed SOB, but there was no improvement with diuresis. Patient was re-admitted to ICU. He was also found to be in AF RVR at that time. He had a metabolic alkalosis secondary to diuresis which was thought to be the cause of his acute respiratory distress in the setting of his chronic pneumonitis.  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 6/6 discussions with family determined that a DNR and  hospice referral are appropriate for this patient. 6/8 he is deemed a candidate for transfer to an inpatient hospice facility.     Discharge Plan:  A:  Acute on chronic respiratory failure   Bibasilar pneumonitis, possibly drug related  Small right apical pneumothorax, pneumomediatinum resolved Severe deconditioning  P:  Supplemental oxygen PRN - Goal SpO2>92  Prednisone 60 taper as able Morphine PRN for pain/respiratory distress.     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 Developed respiratory distress and hypoxemia. Improved with diuresis.  6/03 Increased SOB, PCCM called. Transfer to ICU.  6/03 LE Doppler >>> neg  6/03 TTE >> 55%, mod mr, pa 33  6/07  Goals of care meeting ( wife, son ) >>> DNR, continue medical care, transition to hospice  6/08 Pending hospice / Palliative Care visit  CULTURES:  Blood 5/18 >>> neg  Urine 5/29 > neg  Blood 6/3 >>>  Urine 6/3 >>> neg  ANTIBIOTICS:  Valtrex 5/19 >>> 6/8 Vanc 6/3 >>> 6/5  Cefepime 6/3 >>> 6/5  Levofloxacin 6/4 >>>    INTERVAL HISTORY: Morphine PRN for dyspnea  Consults Oncology Cardiology Inpatient Rehab Hospice Palliative Care Discharge Exam: BP 131/62  Pulse 82  Temp(Src) 98.6 F (37 C) (Oral)  Resp 22  Ht _0  (1.753 m)  Wt 54.4 kg (119 lb 14.9 oz)  BMI 17.70 kg/m2  SpO2 99%  General: Appears to be in no distress, irregular breathing pattern  Neuro: Somnolent but wakes up spontaneously  HEENT: Dry membranes  Cardiovascular: Irregular, no murmurs  Lungs: Bilateral diminished air entry, no added sounds  Abdomen: Soft, bowel sounds present  Musculoskeletal: Trace edema  Skin: No rash   Labs at discharge Lab Results  Component Value Date   CREATININE 1.24 09/24/2013   BUN 44* 09/24/2013   NA 130* 09/24/2013   K 4.0 09/24/2013   CL 94* 09/24/2013   CO2 27 09/24/2013   Lab Results  Component Value Date   WBC 19.8* 09/24/2013   HGB 7.7* 09/24/2013   HCT 22.4* 09/24/2013   MCV 94.5 09/24/2013   PLT 148* 09/24/2013   Lab Results  Component Value Date   ALT 34 09/23/2013   AST 34 09/23/2013   ALKPHOS 86 09/23/2013   BILITOT 0.5 09/23/2013   Lab Results  Component Value Date   INR 1.03 06/21/2013   INR 0.99 06/15/2013   INR 1.01 10/10/2012    Current radiology studies No results found.  Disposition:  02-Short Term Hospital      Discharge Instructions   Diet - low sodium heart healthy    Complete by:  As directed      Increase activity slowly    Complete by:  As directed             Medication List    STOP taking these medications       alum & mag hydroxide-simeth 295-284-13 MG/5ML suspension  Commonly known as:  MAALOX/MYLANTA     aspirin EC 81 MG tablet     CALCIUM 500 + D PO     cholecalciferol 1000 UNITS tablet  Commonly known as:  VITAMIN D     DSS 100 MG Caps     fish oil-omega-3 fatty acids 1000 MG capsule     HYDROcodone-acetaminophen 5-325 MG per tablet  Commonly known as:  NORCO/VICODIN     multivitamin with minerals Tabs tablet     prasugrel 10 MG Tabs  tablet  Commonly known as:  EFFIENT     rosuvastatin 10 MG tablet  Commonly known as:  CRESTOR     valACYclovir 1000 MG tablet  Commonly known as:  VALTREX      TAKE these medications       albuterol (2.5 MG/3ML) 0.083% nebulizer solution  Commonly known as:  PROVENTIL  Take 3 mLs (2.5 mg total) by nebulization every 2 (two) hours as needed for wheezing.     levofloxacin 750 MG tablet  Commonly known as:  LEVAQUIN  Take 1 tablet (750 mg total) by mouth daily.     metoprolol tartrate 25 MG tablet  Commonly known as:  LOPRESSOR  Take 25 mg by mouth every morning.  verified that it is not XL     morphine 20 MG/ML concentrated solution  Commonly known as:  ROXANOL  Take 0.5 mLs (10 mg total) by mouth every 2 (two) hours as needed for moderate pain or shortness of breath.     nitroGLYCERIN 0.4 MG SL tablet  Commonly known as:  NITROSTAT  Place 1 tablet (0.4 mg total) under the tongue every 5 (five) minutes as needed for chest pain.     predniSONE 20 MG tablet  Commonly known as:  DELTASONE  Take 3 tablets (60 mg total) by mouth daily with breakfast.     senna-docusate 8.6-50 MG per tablet  Commonly known as:  Senokot-S  Take 1 tablet by mouth at bedtime as needed for mild constipation.         Discharged Condition: poor   Georgann Housekeeper, ACNP [facility] Pulmonology/Critical Care Pager [phone] or [phone]

## 2013-09-26 ENCOUNTER — Inpatient Hospital Stay: Payer: Medicare Other | Admitting: Adult Health

## 2013-09-26 LAB — CULTURE, BLOOD (ROUTINE X 2)
Culture: NO GROWTH
Culture: NO GROWTH

## 2013-10-18 DEATH — deceased

## 2014-03-29 ENCOUNTER — Encounter (HOSPITAL_COMMUNITY): Payer: Self-pay | Admitting: Cardiovascular Disease

## 2015-09-18 IMAGING — CT CT ANGIO CHEST
1 of 2 series · 19 of 32 positions shown · IV contrast (OMNIPAQUE 300)
Comparison: 05/12/2013

CLINICAL DATA: Shortness of breath, tachycardia, hypoxia. History
of DVT.

EXAM:
CT ANGIOGRAPHY CHEST WITH CONTRAST
TECHNIQUE: Multidetector CT imaging of the chest was performed using the
standard protocol during bolus administration of intravenous
contrast. Multiplanar CT image reconstructions and MIPs were
obtained to evaluate the vascular anatomy.
CONTRAST:  100mL OMNIPAQUE IOHEXOL 350 MG/ML SOLN

[Series 10: thins for pacs · axial · 0.67mm/px · z∈[+1464,+1708]mm · 19 of 273 slices shown]
[im 14/273  lung]
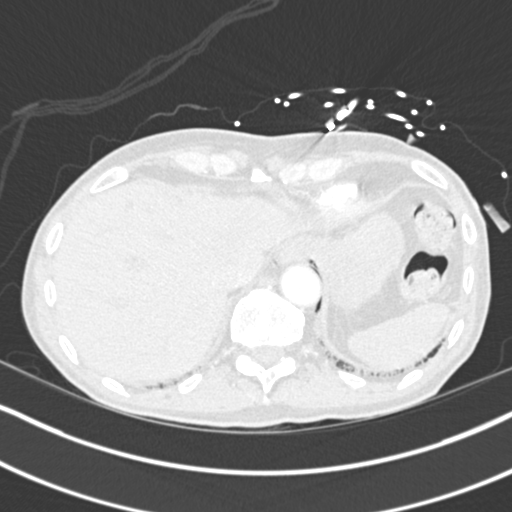
[im 28/273  mediastinal]
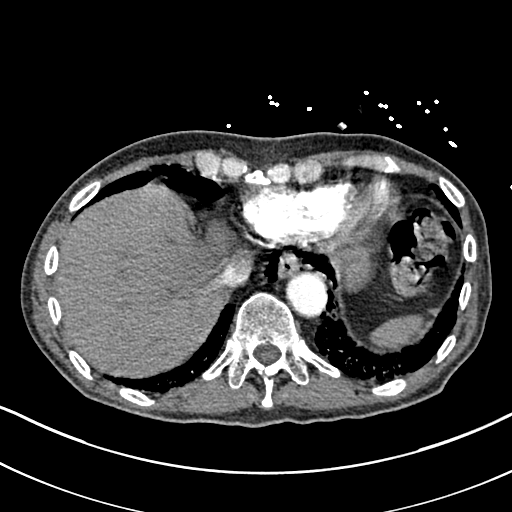
[im 41/273  lung]
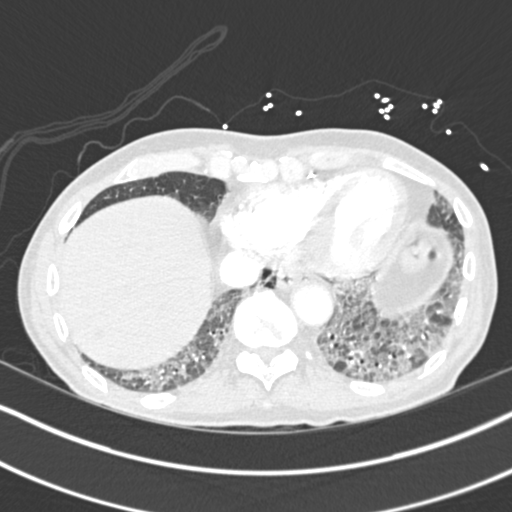
[im 69/273  mediastinal]
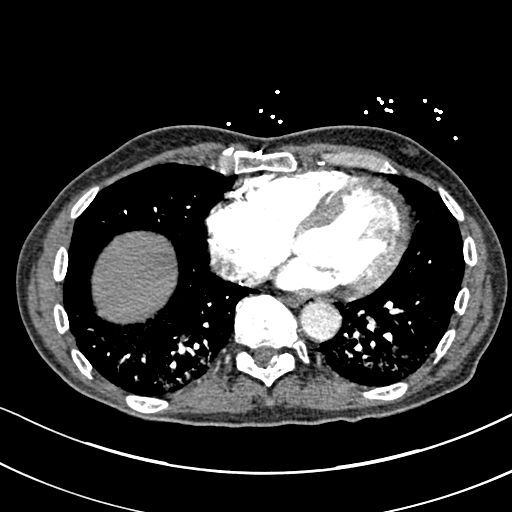
[im 82/273  lung]
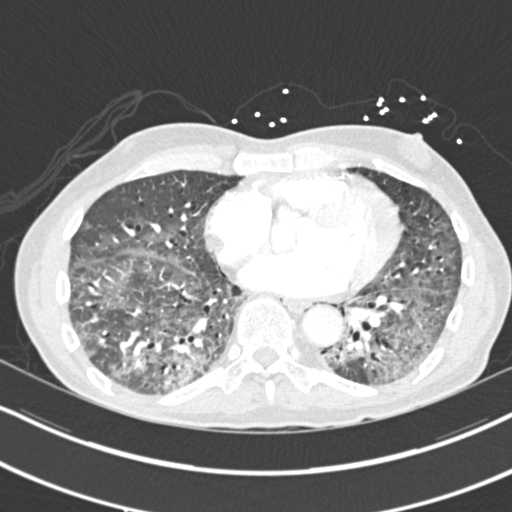
[im 91/273  mediastinal]
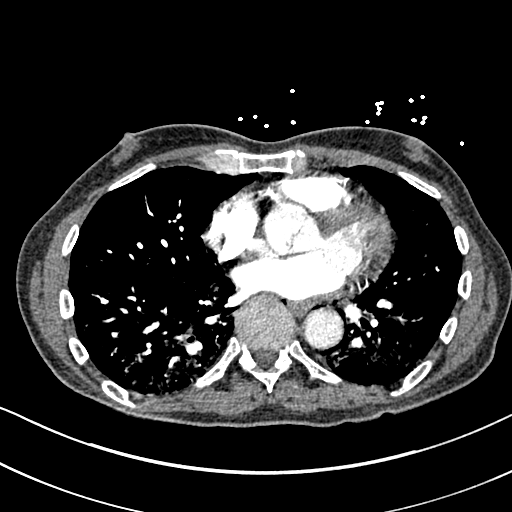
[im 96/273  lung]
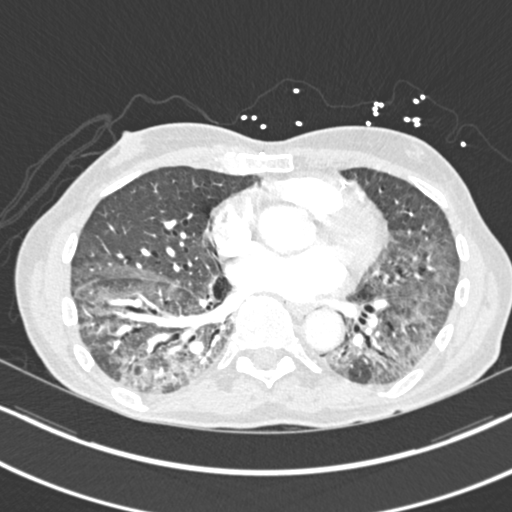
[im 109/273  mediastinal]
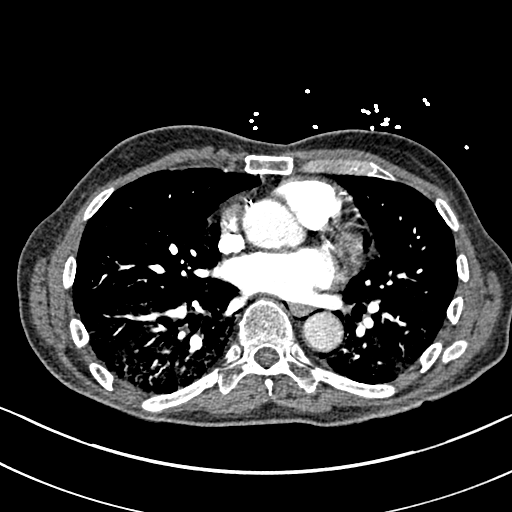
[im 123/273  lung]
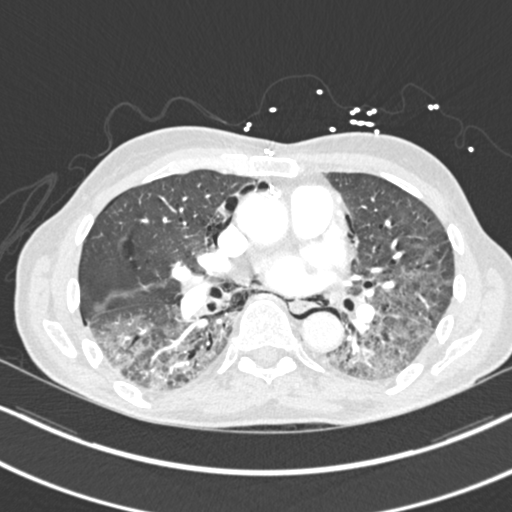
[im 137/273  mediastinal]
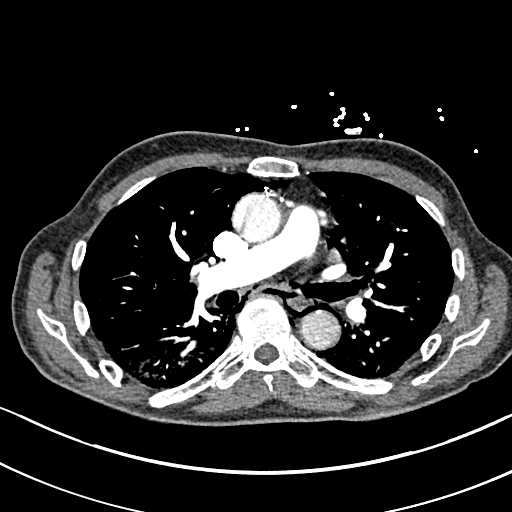
[im 150/273  lung]
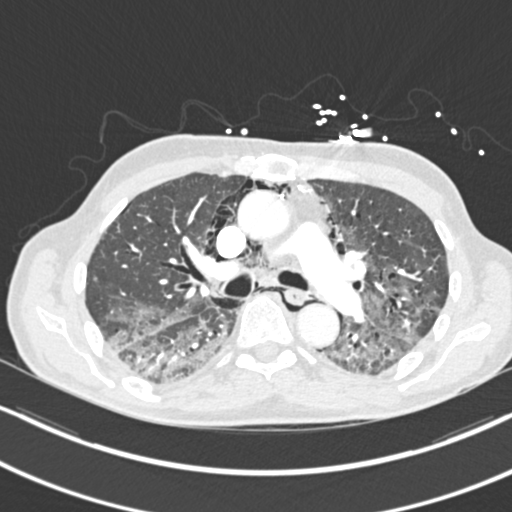
[im 164/273  mediastinal]
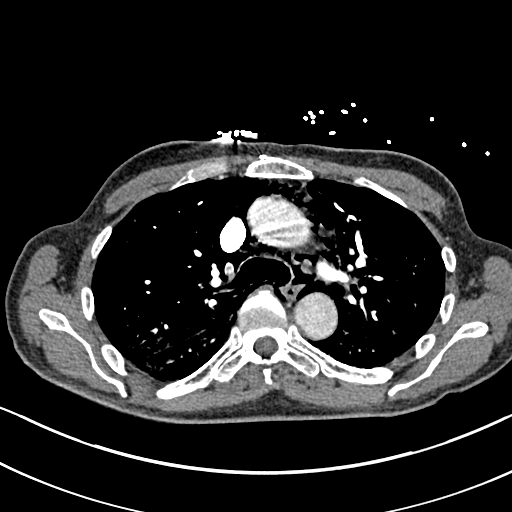
[im 177/273  lung]
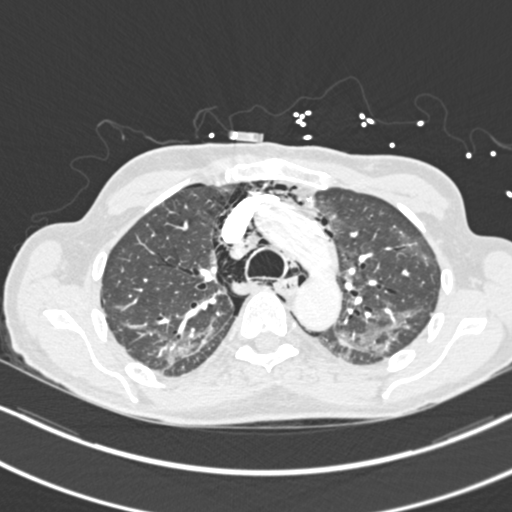
[im 182/273  mediastinal]
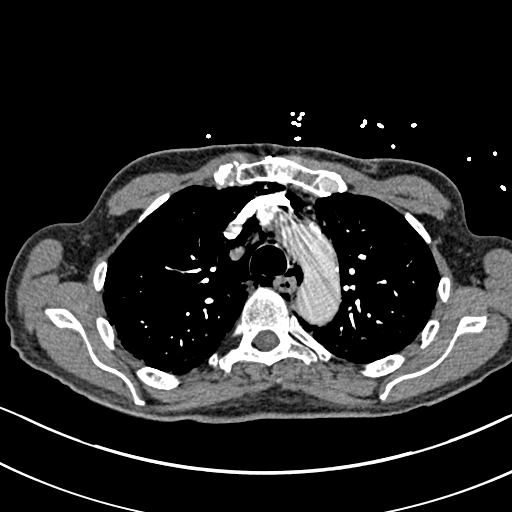
[im 191/273  lung]
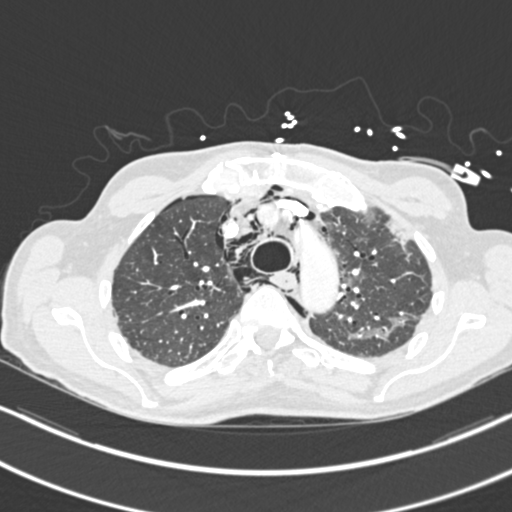
[im 205/273  mediastinal]
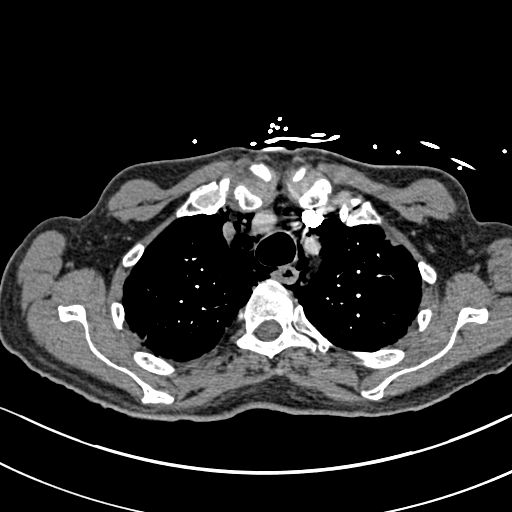
[im 232/273  lung]
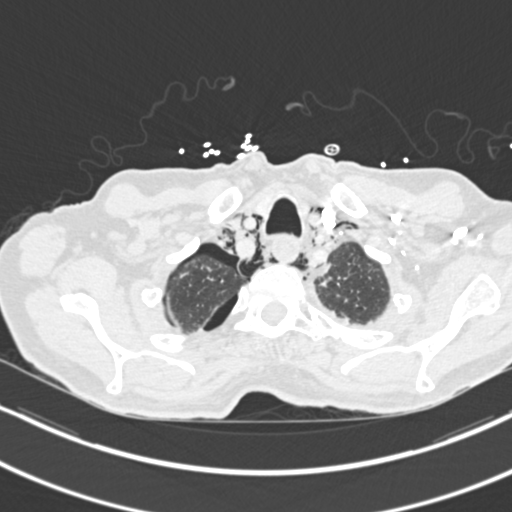
[im 245/273  mediastinal]
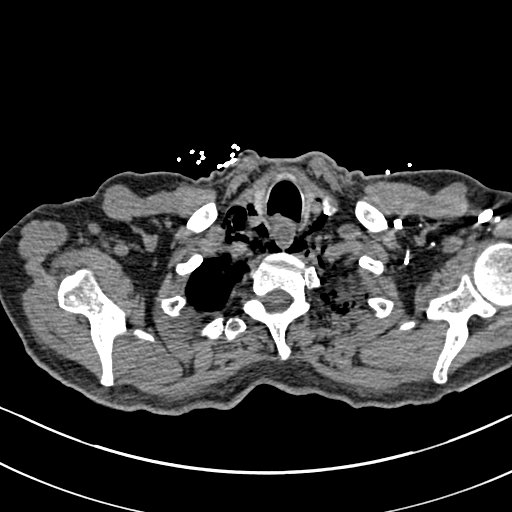
[im 259/273  lung]
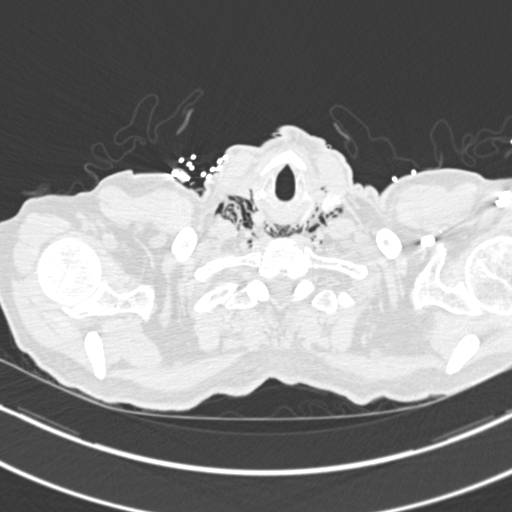

[19 of 32 positions shown; findings below may reference images not displayed]

FINDINGS: THORACIC INLET/BODY WALL:

Emphysema.

MEDIASTINUM:

Large volume of pneumomediastinum, extending into the neck.

No cardiomegaly. No pericardial effusion. Extensive atherosclerosis,
including the coronary arteries. Status post CABG with [REDACTED] and
upper venous graft widely patent. The graft associated with the
lower ostium marker is not clearly visible, presumably chronic given
history. There is occasional respiratory motion, which decreases
sensitivity at the affected levels. Overall, exam is diagnostic.
There is no evidence of acute pulmonary embolism. No evidence of
esophageal thickening. No fluid collection. No adenopathy.

LUNG WINDOWS:

Small right apical pneumothorax, less than 5%.

There is dependent ground-glass and fine interstitial opacities.
Changes are similar to CT 08/25/2013. No pleural effusion or
interlobular septal thickening.

UPPER ABDOMEN:

Presumed cyst in the upper right liver, stable from previous
imaging.

OSSEOUS:

No acute fracture.  No suspicious lytic or blastic lesions.

Critical Value/emergent results were called by telephone at the time
of interpretation on 09/04/2013 at [DATE] to Dr. FLORENCE, CENOBIO ,
who verbally acknowledged these results.

Review of the MIP images confirms the above findings.
IMPRESSION: 1. Pneumomediastinum and trace right apical pneumothorax (less than
5%). This is likely from pulmonic air leak given #2.
2. Unchanged ground-glass opacities in the lower lungs, favoring an
inflammatory (including drug reaction) pneumonitis or atypical
infection. Given immunocompromised state (in the setting of
lymphoma), consider PCP pneumonia - which can predispose to
pneumothorax).
3. Negative for acute pulmonary embolism.
